# Patient Record
Sex: Female | Born: 1990 | Race: White | Hispanic: No | Marital: Single | Smoking: Never smoker
Health system: Southern US, Community
[De-identification: ages and names within clinical notes are randomized; demographics above are authoritative.]

## PROBLEM LIST (undated history)

## (undated) DIAGNOSIS — N946 Dysmenorrhea, unspecified: Secondary | ICD-10-CM

## (undated) DIAGNOSIS — E063 Autoimmune thyroiditis: Secondary | ICD-10-CM

## (undated) DIAGNOSIS — E11649 Type 2 diabetes mellitus with hypoglycemia without coma: Secondary | ICD-10-CM

## (undated) HISTORY — DX: Autoimmune thyroiditis: E06.3

## (undated) HISTORY — DX: Type 2 diabetes mellitus with hypoglycemia without coma: E11.649

## (undated) HISTORY — DX: Dysmenorrhea, unspecified: N94.6

---
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
## 2010-01-03 ENCOUNTER — Emergency Department (HOSPITAL_COMMUNITY): Admission: EM | Admit: 2010-01-03 | Discharge: 2010-01-04 | Payer: Self-pay | Admitting: Emergency Medicine

## 2010-01-04 ENCOUNTER — Telehealth: Payer: Self-pay | Admitting: Family Medicine

## 2010-01-04 ENCOUNTER — Ambulatory Visit: Payer: Self-pay | Admitting: "Endocrinology

## 2010-01-10 ENCOUNTER — Ambulatory Visit: Payer: Self-pay | Admitting: "Endocrinology

## 2010-01-29 ENCOUNTER — Ambulatory Visit: Payer: Self-pay | Admitting: Family Medicine

## 2010-01-29 DIAGNOSIS — M546 Pain in thoracic spine: Secondary | ICD-10-CM | POA: Insufficient documentation

## 2010-02-12 ENCOUNTER — Ambulatory Visit: Payer: Self-pay | Admitting: "Endocrinology

## 2010-03-05 ENCOUNTER — Telehealth: Payer: Self-pay | Admitting: Family Medicine

## 2010-04-01 ENCOUNTER — Encounter: Payer: Self-pay | Admitting: Family Medicine

## 2010-04-05 ENCOUNTER — Encounter: Payer: Self-pay | Admitting: Family Medicine

## 2010-06-07 ENCOUNTER — Ambulatory Visit: Payer: Self-pay | Admitting: Women's Health

## 2010-07-04 ENCOUNTER — Encounter: Payer: Self-pay | Admitting: Family Medicine

## 2010-07-04 ENCOUNTER — Ambulatory Visit: Payer: Self-pay | Admitting: "Endocrinology

## 2010-07-18 ENCOUNTER — Encounter: Payer: Self-pay | Admitting: Family Medicine

## 2010-07-22 ENCOUNTER — Encounter: Admission: RE | Admit: 2010-07-22 | Discharge: 2010-07-26 | Payer: Self-pay | Admitting: Internal Medicine

## 2010-10-15 ENCOUNTER — Ambulatory Visit: Payer: Self-pay | Admitting: "Endocrinology

## 2010-11-28 NOTE — Letter (Signed)
Summary: [facility]  [facility]   Imported By: Lanelle Bal 04/08/2010 10:36:35  _____________________________________________________________________  External Attachment:    Type:   Image     Comment:   External Document

## 2010-11-28 NOTE — Progress Notes (Signed)
Summary: Phone-Fyi  Phone Note Call from Patient Call back at Home Phone [phone]   Caller: Mom Reason for Call: Lab or Test Results Summary of Call: Patient had blood sugar of 592 and was taking to the emergency room @ [facility]. Mother states she is taking her to a endocringloist this morning. Initial call taken by: Barb Merino,  January 04, 2010 8:38 AM  Follow-up for Phone Call        [facility] Triage Call Report Triage Record Num: [id number] Operator: Aundra Millet Patient Name: Esther Bradstreet Call Date & Time: 01/03/2010 9:18:48PM Patient Phone: [phone] PCP: Lelon Perla Patient Gender: Female PCP Fax : Patient DOB: 01-Apr-1991 Practice Name: [facility] Reason for Call: MOm/Lauren calling -- LMP 12/19/2009 -- Pt has been having increased thirst for several weeks along with vaginal yeast infections .Rash on lower abd onset 01/02/2010 , but tonight now more widespead. Pt was planning to make appt tomorrow. Tonight, 01/03/2010, Mom decided to check pt's BS , b/c sibling has Juvenille DM -- pt's BS is 592, and checked again and got BS 584 -- pt has never been diagnosed with DM. RN advised to go to [facility] ED now Protocol(s) Used: Diabetes - High Blood Sugar (Pediatric) Recommended Outcome per Protocol: See ED Immediately Reason for Outcome: Blood glucose > 500 mg/dl (64.4 mmol/l) Care Advice:  ~ BRING MEDS: Be sure to bring a list of your child's meds with you. GO TO ED NOW (or PCP triage) IF NO PCP TRIAGE: Your child needs to be seen within the next hour. Go to the [facility] at _____________ Hospital. Leave as soon as you can. IF PCP TRIAGE REQUIRED: Your child may need to be seen. Your doctor will want to talk with you to decide what's best. I'll page him now. If you haven't heard from the on-call doctor within 30 minutes, or your child becomes worse, go directly to the [facility] at ___________ Hospital.  ~  ~ CARE ADVICE given per  Diabetes - High Blood Sugar (Pediatric) guideline  Additional Follow-up for Phone Call Additional follow up Details #1::        Tried to call;  pt on way to Endocrinologis this am .Kandice Hams  January 04, 2010 8:56 AM  t    Additional Follow-up for Phone Call Additional follow up Details #2::    Try to call them later please Follow-up by: Loreen Freud DO,  January 04, 2010 11:18 AM  Additional Follow-up for Phone Call Additional follow up Details #3:: Details for Additional Follow-up Action Taken: pt was dx with dm type 1 and some thyroid issue as well. pt mother states that we should be expecting some paper work from dr Fransico Michael ph [phone]. pt is feeling a little better and has been started on insulin and is schedule to go back to get further teach on how to cope with DM. Pt will eventually get a insulin pump but for now will use the insulin and go to nutritional classes...............Marland KitchenFelecia Deloach CMA  January 04, 2010 3:15 PM

## 2010-11-28 NOTE — Progress Notes (Signed)
Summary: referral  Phone Note Call from Patient   Caller: Patient Summary of Call: Pt would like to go ahead and see the chiropractic as suggested. Pls advise ok to refer........Marland KitchenFelecia Deloach CMA  Mar 05, 2010 11:39 AM   Follow-up for Phone Call        She can make her own appointment-- just give her Dr Herbert Moors number Follow-up by: Loreen Freud DO,  Mar 05, 2010 12:14 PM  Additional Follow-up for Phone Call Additional follow up Details #1::        pt aware,. dr Christell Faith info given to pt.Marland KitchenMarland KitchenFelecia Deloach CMA  Mar 05, 2010 12:23 PM

## 2010-11-28 NOTE — Assessment & Plan Note (Signed)
Summary: back pain//lch   Vital Signs:  Patient profile:   20 year old female Height:      63 inches Weight:      167 pounds BMI:     29.69 Pulse rate:   72 / minute Pulse rhythm:   regular BP sitting:   100 / 70  (left arm) Cuff size:   large  Vitals Entered By: Army Fossa CMA (January 29, 2010 4:05 PM) CC: Pt here has had upper back pain over the past semester and it is gradually getting worse., Back Pain   History of Present Illness:       This is an 20 year old woman who presents with Back Pain.  The patient denies fever, chills, weakness, loss of sensation, fecal incontinence, urinary incontinence, urinary retention, dysuria, rest pain, inability to work, and inability to care for self.  The pain is located in the left thoracic back.  The pain began at home, gradually, and after lifting.  Pt helped her sister after her foot surgery---pain started then.  Current Medications (verified): 1)  Novolog 100 Unit/ml Soln (Insulin Aspart) 2)  Lantus 100 Unit/ml Soln (Insulin Glargine) 3)  Flexeril 10 Mg Tabs (Cyclobenzaprine Hcl) .Marland Kitchen.. 1 By Mouth Three Times A Day  Allergies: 1)  ! Amoxicillin 2)  ! Pcn 3)  ! Zithromax  Past History:  Past medical, surgical, family and social histories (including risk factors) reviewed for relevance to current acute and chronic problems.  Past Medical History: Reviewed history from 05/31/2008 and no changes required. dysmenorrhea  Past Surgical History: Reviewed history from 05/31/2008 and no changes required. none  Family History: Reviewed history from 05/12/2007 and no changes required. PGf- MI Family History of Cholesterol Disease  Social History: Reviewed history from 05/31/2008 and no changes required. Negative history of passive tobacco smoke exposure.  Care taker verifies today that the child's current immunizations are up to date.  Not using alcohol Not using substances of abuse  Review of Systems      See  HPI  Physical Exam  General:  Well-developed,well-nourished,in no acute distress; alert,appropriate and cooperative throughout examination Msk:  + muscle spasms and ropiness in paraspinal muscles b/l --L>R  normal ROM, no joint tenderness, no joint swelling, no joint warmth, and no redness over joints.   myofascial tech used to loosen muscles===attempted HVLA ---muscles were too tight even after trigger point and myofa. tech used Neurologic:  alert & oriented X3 and gait normal.   Psych:  Oriented X3 and normally interactive.     Impression & Recommendations:  Problem # 1:  BACK PAIN, THORACIC REGION, LEFT (ICD-724.1)  Her updated medication list for this problem includes:    Flexeril 10 Mg Tabs (Cyclobenzaprine hcl) .Marland Kitchen... 1 by mouth three times a day  Discussed use of moist heat or ice, modified activities, medications, and stretching/strengthening exercises. Back care instructions given. To be seen in 2 weeks if no improvement; sooner if worsening of symptoms.   Orders: OMT 1-2 Body Regions [phone])  Complete Medication List: 1)  Novolog 100 Unit/ml Soln (Insulin aspart) 2)  Lantus 100 Unit/ml Soln (Insulin glargine) 3)  Flexeril 10 Mg Tabs (Cyclobenzaprine hcl) .Marland Kitchen.. 1 by mouth three times a day Prescriptions: FLEXERIL 10 MG TABS (CYCLOBENZAPRINE HCL) 1 by mouth three times a day  #30 x 0   Entered and Authorized by:   Loreen Freud DO   Signed by:   Loreen Freud DO on 01/29/2010   Method used:  Electronically to        [facility]       [address]       [place], [place]  [address]       Ph: [phone]       Fax: [phone]   RxID:   [id number]

## 2011-01-20 ENCOUNTER — Ambulatory Visit (INDEPENDENT_AMBULATORY_CARE_PROVIDER_SITE_OTHER): Payer: Self-pay | Admitting: "Endocrinology

## 2011-01-20 DIAGNOSIS — E1069 Type 1 diabetes mellitus with other specified complication: Secondary | ICD-10-CM

## 2011-01-20 DIAGNOSIS — E1065 Type 1 diabetes mellitus with hyperglycemia: Secondary | ICD-10-CM

## 2011-01-20 DIAGNOSIS — E049 Nontoxic goiter, unspecified: Secondary | ICD-10-CM

## 2011-01-20 DIAGNOSIS — E038 Other specified hypothyroidism: Secondary | ICD-10-CM

## 2011-01-20 DIAGNOSIS — E063 Autoimmune thyroiditis: Secondary | ICD-10-CM

## 2011-01-20 LAB — URINE CULTURE

## 2011-01-20 LAB — POCT I-STAT 3, VENOUS BLOOD GAS (G3P V)
Acid-Base Excess: 1 mmol/L (ref 0.0–2.0)
pCO2, Ven: 43.3 mmHg — ABNORMAL LOW (ref 45.0–50.0)
pH, Ven: 7.385 — ABNORMAL HIGH (ref 7.250–7.300)
pO2, Ven: 25 mmHg — CL (ref 30.0–45.0)

## 2011-01-20 LAB — GLUCOSE, CAPILLARY
Glucose-Capillary: 292 mg/dL — ABNORMAL HIGH (ref 70–99)
Glucose-Capillary: 507 mg/dL — ABNORMAL HIGH (ref 70–99)

## 2011-01-20 LAB — URINALYSIS, ROUTINE W REFLEX MICROSCOPIC
Bilirubin Urine: NEGATIVE
Hgb urine dipstick: NEGATIVE
Ketones, ur: NEGATIVE mg/dL
Nitrite: NEGATIVE
Protein, ur: NEGATIVE mg/dL
Urobilinogen, UA: 0.2 mg/dL (ref 0.0–1.0)

## 2011-01-20 LAB — DIFFERENTIAL
Basophils Absolute: 0 10*3/uL (ref 0.0–0.1)
Basophils Relative: 0 % (ref 0–1)
Eosinophils Absolute: 0.1 10*3/uL (ref 0.0–0.7)
Monocytes Absolute: 0.6 10*3/uL (ref 0.1–1.0)
Monocytes Relative: 10 % (ref 3–12)
Neutro Abs: 3.6 10*3/uL (ref 1.7–7.7)
Neutrophils Relative %: 54 % (ref 43–77)

## 2011-01-20 LAB — POCT PREGNANCY, URINE: Preg Test, Ur: NEGATIVE

## 2011-01-20 LAB — COMPREHENSIVE METABOLIC PANEL
ALT: 42 U/L — ABNORMAL HIGH (ref 0–35)
AST: 29 U/L (ref 0–37)
Alkaline Phosphatase: 192 U/L — ABNORMAL HIGH (ref 39–117)
GFR calc Af Amer: >60 mL/min (ref >60)
Glucose, Bld: 454 mg/dL — ABNORMAL HIGH (ref 70–99)
Potassium: 3.5 mEq/L (ref 3.5–5.1)
Sodium: 128 mEq/L — ABNORMAL LOW (ref 135–145)
Total Protein: 7.4 g/dL (ref 6.0–8.3)

## 2011-01-20 LAB — CBC
Hemoglobin: 14.7 g/dL (ref 12.0–15.0)
RBC: 4.52 MIL/uL (ref 3.87–5.11)
RDW: 11.7 % (ref 11.5–15.5)

## 2011-01-20 LAB — POCT I-STAT, CHEM 8
BUN: 12 mg/dL (ref 6–23)
Calcium, Ion: 1.17 mmol/L (ref 1.12–1.32)
Chloride: 99 mEq/L (ref 96–112)
HCT: 43 % (ref 36.0–46.0)
Potassium: 3.6 mEq/L (ref 3.5–5.1)
Sodium: 131 mEq/L — ABNORMAL LOW (ref 135–145)

## 2011-01-20 LAB — HEMOGLOBIN A1C: Mean Plasma Glucose: 226 mg/dL

## 2011-01-20 LAB — KETONES, QUALITATIVE: Acetone, Bld: NEGATIVE

## 2011-01-21 ENCOUNTER — Encounter: Payer: Self-pay | Admitting: Internal Medicine

## 2011-01-21 ENCOUNTER — Ambulatory Visit (INDEPENDENT_AMBULATORY_CARE_PROVIDER_SITE_OTHER): Payer: BC Managed Care – PPO | Admitting: Internal Medicine

## 2011-01-21 DIAGNOSIS — H9209 Otalgia, unspecified ear: Secondary | ICD-10-CM | POA: Insufficient documentation

## 2011-01-21 DIAGNOSIS — E109 Type 1 diabetes mellitus without complications: Secondary | ICD-10-CM

## 2011-01-21 DIAGNOSIS — E063 Autoimmune thyroiditis: Secondary | ICD-10-CM | POA: Insufficient documentation

## 2011-01-21 MED ORDER — DOXYCYCLINE HYCLATE 100 MG PO CAPS: 100.0000 mg | ORAL_CAPSULE | Freq: Two times a day (BID) | ORAL | Status: AC

## 2011-01-21 NOTE — Assessment & Plan Note (Signed)
Per endocrinology 

## 2011-01-21 NOTE — Progress Notes (Signed)
  Subjective:    Patient ID: Traci Rivas, female    DOB: [birth year], 20 y.o.   MRN: [id number]  HPI  left ear pain for a few days, ~  2 weeks  the pain is on and off, definitely worse if she push  her ear   PMH  insulin-dependent diabetes Hashimoto's   Review of Systems   denies fevers No runny nose or sore throat No discharge from the ear No sinus pain but mild congestion    Objective:   Physical Exam  alert oriented x3 Face is symmetric, nontender to palpation of the maxillary sinuses  oropharynx is normal without redness or discharge  right ear normal Left ear: Tympanic membrane is normal, canal is slightly swollen and red without discharge.  Not tender to percussion on either mastoid area       Assessment & Plan:

## 2011-01-21 NOTE — Assessment & Plan Note (Addendum)
20 year old female with diabetes presents with otalgia  exam shows some swelling at the left ear canal with  a + trago  sign, most likely she has a s.q. Infection in the canal , no evidence of swimmers ear Will treat w/  Doxy, See instructions

## 2011-01-21 NOTE — Patient Instructions (Signed)
Doxy as prescribed x 1 week Tylenol for pain Call any time if pain increases, you have fever, ear discharge, swelling around the ear

## 2011-01-22 ENCOUNTER — Telehealth: Payer: Self-pay | Admitting: *Deleted

## 2011-01-22 NOTE — Telephone Encounter (Signed)
Message copied by Army Fossa on Wed Jan 22, 2011  3:50 PM ------      Message from: Traci Rivas      Created: Tue Jan 21, 2011  8:35 PM       Check on her, was seen w/ otalgia

## 2011-01-23 NOTE — Telephone Encounter (Signed)
THX!

## 2011-01-23 NOTE — Telephone Encounter (Signed)
I spoke w/ pt she states that her ears are doing much better!

## 2011-02-06 LAB — URINALYSIS, ROUTINE W REFLEX MICROSCOPIC
Bilirubin Urine: NEGATIVE
Glucose, UA: NEGATIVE mg/dL
Hgb urine dipstick: NEGATIVE
Protein, ur: NEGATIVE mg/dL

## 2011-02-06 LAB — CBC
Platelets: 242 10*3/uL (ref 150–400)
RDW: 12.1 % (ref 11.4–15.5)

## 2011-02-06 LAB — URINE CULTURE

## 2011-02-06 LAB — BASIC METABOLIC PANEL
BUN: 12 mg/dL (ref 6–23)
Calcium: 9.2 mg/dL (ref 8.4–10.5)
Glucose, Bld: 112 mg/dL — ABNORMAL HIGH (ref 70–99)
Sodium: 134 mEq/L — ABNORMAL LOW (ref 135–145)

## 2011-02-06 LAB — DIFFERENTIAL
Basophils Absolute: 0 10*3/uL (ref 0.0–0.1)
Lymphocytes Relative: 5 % — ABNORMAL LOW (ref 24–48)
Neutro Abs: 10.3 10*3/uL — ABNORMAL HIGH (ref 1.7–8.0)

## 2011-03-11 NOTE — Assessment & Plan Note (Signed)
[facility]                                 ON-CALL NOTE   Traci Rivas, Traci Rivas                    MRN:          [id number]  DATE:04/24/2007                            DOB:          Mar 08, 1991    TIME OF PHONE CALL:  10:24 a.m.   The phone message said she is canceling her 12-noon appointment because  she was feeling better.     Karie Schwalbe, MD  Electronically Signed    RIL/MedQ  DD: 04/24/2007  DT: 04/24/2007  Job #: [id number]   cc:   Lelon Perla, DO

## 2011-03-18 ENCOUNTER — Encounter: Payer: Self-pay | Admitting: *Deleted

## 2011-04-29 ENCOUNTER — Ambulatory Visit (INDEPENDENT_AMBULATORY_CARE_PROVIDER_SITE_OTHER): Payer: BC Managed Care – PPO | Admitting: "Endocrinology

## 2011-04-29 ENCOUNTER — Encounter: Payer: Self-pay | Admitting: "Endocrinology

## 2011-04-29 VITALS — BP 106/71 | HR 61 | Wt 162.2 lb

## 2011-04-29 DIAGNOSIS — E063 Autoimmune thyroiditis: Secondary | ICD-10-CM

## 2011-04-29 DIAGNOSIS — E1169 Type 2 diabetes mellitus with other specified complication: Secondary | ICD-10-CM

## 2011-04-29 DIAGNOSIS — IMO0002 Reserved for concepts with insufficient information to code with codable children: Secondary | ICD-10-CM

## 2011-04-29 DIAGNOSIS — E1065 Type 1 diabetes mellitus with hyperglycemia: Secondary | ICD-10-CM

## 2011-04-29 DIAGNOSIS — E11649 Type 2 diabetes mellitus with hypoglycemia without coma: Secondary | ICD-10-CM

## 2011-04-29 DIAGNOSIS — E038 Other specified hypothyroidism: Secondary | ICD-10-CM

## 2011-04-29 LAB — T4, FREE: Free T4: 1 ng/dL (ref 0.80–1.80)

## 2011-04-29 LAB — T3, FREE: T3, Free: 2.9 pg/mL (ref 2.3–4.2)

## 2011-04-29 LAB — GLUCOSE, POCT (MANUAL RESULT ENTRY): POC Glucose: 134

## 2011-04-29 NOTE — Progress Notes (Addendum)
CC: FU T1DM, hypoglycemia, hypothyroid, thyroiditis  HPI: 20 y.o. Caucasian young woman 1. Patient developed polyuria, polydipsia, excessive thirst, and a difficult to treat yeast vaginitis in February and March 2011. Because her sister has T1DM and the mother recognized the signs and symptoms of DM, the mother did a CBG at home. That value was 592. Mother took the young woman to the ED where her glucose was 507. She was given a dose of insulin and sent home, with instructions to call her doctor. Instead, mother called me and I saw the patient the next day, 03.11.11. She looked tired and was dehydrated. Her thyroid gland was enlarged at a size of 20-25 grams. Her HbA1c was 9.3%.  I put her on our standard multiple daily injection of insulin (MDI) regimen with Lantus as a basal insulin and Novolog as a mealtime insulin. 2. The standard [facility] method for multiple daily injections (MDI) of insulin is to use a basal insulin once a day and a rapid-acting insulin at meals, bedtime (HS), at 2:00 AM if needed, and at other times if needed. Each patient is given a specific MDI insulin plan based upon the patient's age, body size, perceived sensitivity or resistance to insulin, and individual clinical course over time.   A. The standard basal insulin is Lantus (glargine) which can be given as a once daily insulin even at low doses. We usually give Lantus at about bedtime to accompany the HS BG check, snack if needed, or rapid-acting insulin if needed.   B. We can use any of the three currently available rapid-acting insulins: Novolg aspart, Humalog lispro, or Apidra glulisine. We use Novolog aspart insulin as our standard rapid-acting insulin.  C. At mealtimes, we use the Two-Component method for determining the doses of rapidly-acting insulins:   1. The Correction Dose is determined by the BG concentration and the patient's Insulin Sensitivity Factor, for example, one unit for every 50 points of BG > 150.   2. The  Food Dose is determined by the patient's Insulin to Carbohydrate Ratio (ICR), for example one unit of insulin for every 15 grams of carbohydrates.      3. The Total Dose of insulin to be given at a particular meal is the sum of the Correction Dose and Food Dose for that meal.  D. At bedtime the patients checks BG.    1. If the BG is < 200, the patient takes a free snack that is inversely proportional to the BG, for example, if BG < 76 = 40 grams of carbs; BG 76-100 = 30 grams; BG 101-150 = 20 grams; and BG 151-200 = 10 grams.   2. If BG is 201-250, no free snack or additional rapid-acting insulin by sliding scale.   3. If BG is > 250, the patient takes additional rapid-acting insulin by a sliding scale, for example one unit fore every 50 points of BG > 250.  E. At 2:00-3:00 AM, at least initially, the patient will check BG and if the BG is > 250 will take a dose of rapid-acting insulin using the patient's own HS sliding scale.    F. The endocrinologist will change the Lantus dose and the ISF and ICR for rapid-acting insulin as needed to improve BG control. 3. Catie's last PSSG visit was on 03.26.12. 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 Since she's been back home she  increased her Lantus dose back to 31. 4. PROS: Constitutional: The patient feels well, is healthy, and has no significant complaints. Eyes: Vision is good. There are no significant eye complaints. Neck: The patient has no complaints of anterior neck swelling, soreness, tenderness,  pressure, discomfort, or difficulty swallowing.  Heart: Heart rate increases with exercise or other physical activity. The patient has no complaints of palpitations, irregular heat beats, chest pain, or chest pressure. Gastrointestinal: Bowel movents seem normal. The patient has no complaints of excessive hunger, acid reflux, upset stomach, stomach aches  or pains, diarrhea, or constipation. Legs: Muscle mass and strength seem normal. There are no complaints of numbness, tingling, burning, or pain. No edema is noted. Feet: There are no obvious foot problems. There are no complaints of numbness, tingling, burning, or pain.No edema is noted. Hypoglycemia: No severe episodes 5. BG printout: Variable BGs, some higher, some lower  PMFSH: 1. Is doing an on-line bachelor's degree program with [employer].  2. 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  ROS: There are no other significant problems involving her other eleven body systems.  PHYSICAL EXAM: BP 106/71  Pulse 61  Wt 162 lb 3.2 oz (73.573 kg) HbA1c 7.5% Constitutional: The patient looks healthy and appears physically and emotionally well. She is overweight. Eyes: There is no arcus or proptosis.  Mouth: The oropharynx appears normal. The tongue appears normal. There is normal oral moisture. There is no obvious gingivitis. Neck: There are no bruits present. The thyroid gland appears enlarged. The thyroid gland is approximately 25 grams in size. The consistency of the thyroid gland is relatively firm. There is no thyroid tenderness to palpation. Lungs: The lungs are clear. Air movement is good. Heart: The heart rhythm and rate appear normal. Heart sounds S1 and S2 are normal. I do not appreciate any pathologic heart murmurs. Abdomen: The abdominal is enlarged. Bowel sounds are normal. The abdomen is soft and non-tender. There is no obviously palpable hepatomegaly, splenomegaly, or other masses.  Arms: Muscle mass appears appropriate for age.  Hands: There is no obvious tremor. Phalangeal and metacarpophalangeal joints appear normal. Palms are normal. Legs: Muscle mass appears appropriate for age. There is no edema.  Feet: There are no significant deformities. Dorsalis pedis pulses are normal 1+  bilaterally.  Neurologic: Muscle strength is normal for age and gender  in both the upper and the lower extremities. Muscle tone appears normal. Sensation to touch is normal in the legs and feet.  Labs: 01.05.12  ASSESSMENT: 1. T1DM: Overall she is in better control. The lower HbA1c reflects both fewer higher BGs and more lower BGs while at camp. We've discussed the option of an insulin pump. She prefers to remain on a MDI regimen at this time. 2. Hypoglycemia: No severe episodes 2. Hypothyroid: Euthyroid in January. 5. Thyroiditis: clinically quiescent  PLAN: 1. Diagnostic: TFTs and C-peptide 2. Therapeutic: Continue the current insulin plan and Synthroid dose 3. Patient education: Discussed the pros and cons of insulin pumps at length. She feels that her current MDI regimen is working well. Furthermore, due to her upcoming concert schedule she does not want to start any new medical regimen unless she absolutely has to. Total appointment time was greater than 40 minutes. More than 50% of that time was devoted to counseling. 4. Follow-up: three months  Level of Service: This visit lasted in excess of 40 minutes. More than 50%  of the visit was devoted to counseling.

## 2011-06-12 ENCOUNTER — Other Ambulatory Visit: Payer: Self-pay | Admitting: Women's Health

## 2011-06-18 ENCOUNTER — Other Ambulatory Visit: Payer: Self-pay

## 2011-06-25 ENCOUNTER — Encounter: Payer: Self-pay | Admitting: Women's Health

## 2011-06-25 ENCOUNTER — Ambulatory Visit (INDEPENDENT_AMBULATORY_CARE_PROVIDER_SITE_OTHER): Payer: BC Managed Care – PPO | Admitting: Women's Health

## 2011-06-25 VITALS — BP 130/70 | Ht 63.5 in | Wt 162.0 lb

## 2011-06-25 DIAGNOSIS — Z01419 Encounter for gynecological examination (general) (routine) without abnormal findings: Secondary | ICD-10-CM

## 2011-06-25 DIAGNOSIS — B373 Candidiasis of vulva and vagina: Secondary | ICD-10-CM

## 2011-06-25 MED ORDER — FLUCONAZOLE 150 MG PO TABS: 150.0000 mg | ORAL_TABLET | Freq: Once | ORAL | Status: AC

## 2011-06-25 MED ORDER — NORETHIN ACE-ETH ESTRAD-FE 1-20 MG-MCG PO TABS: 1.0000 | ORAL_TABLET | Freq: Every day | ORAL | Status: DC

## 2011-06-25 NOTE — Progress Notes (Signed)
Traci Rivas September 03, 1991 [id number]    History:    The patient presents for annual exam.  Attending [employer] online. Taking classes for [facility] counseling. 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   Past medical history, past surgical history, family history and social history were all reviewed and documented in the EPIC chart.   ROS:  A  ROS was performed and pertinent positives and negatives are included in the history.  Exam:  Filed Vitals:   06/25/11 0935  BP: 130/70    General appearance:  Normal Head/Neck:  Normal, without cervical or supraclavicular adenopathy. Thyroid:  Symmetrical, normal in size, without palpable masses or nodularity. Respiratory  Effort:  Normal  Auscultation:  Clear without wheezing or rhonchi Cardiovascular  Auscultation:  Regular rate, without rubs, murmurs or gallops  Edema/varicosities:  Not grossly evident Abdominal  Soft,nontender, without masses, guarding or rebound.  Liver/spleen:  No organomegaly noted  Hernia:  None appreciated  Skin  Inspection:  Grossly normal  Palpation:  Grossly normal Neurologic/psychiatric  Orientation:  Normal with appropriate conversation.  Mood/affect:  Normal  Genitourinary    Breasts: Examined lying and sitting.     Right: Without masses, retractions, discharge or axillary adenopathy.     Left: Without masses, retractions, discharge or axillary adenopathy.   Inguinal/mons:  Normal without inguinal adenopathy  External genitalia:  Normal  BUS/Urethra/Skene's glands:  Normal  Bladder:  Normal  Vagina:  Normal  Cervix:  Normal  Uterus:   normal in size, shape and contour.  Midline and mobile  Adnexa/parametria:     Rt: Without masses or tenderness.   Lt: Without masses or tenderness.  Anus and perineum: Normal  Digital rectal exam: Normal sphincter tone without palpated masses or tenderness  Assessment/Plan:  20 y.o.SWF Traci Rivas  for annual exam. Gardasil completed, she is  hypothyroid, type I diabetic since age 15 and does see an endocrinologist Dr. Lorriane Shire. She was placed on Loestrin 1/20 last year due to dysmenorrhea. She has done well on the Loestrin and will occasionally skip a cycle while on it, and would like to take continuously to prevent cycles.   Yeast noted on wet prep  Prescription proper use of Loestrin 1/20 to take continuously was given reviewed. Did review slight risk for blood clots strokes,. has had good relief of dysmenorrhea. SBEs, exercise, MVI, condoms encouraged if she does become sexually active. Prescription for Diflucan 150 by mouth x1 dose with a refill. Yeast prevention discussed will call if no relief.   [name] [facility], 11:29 AM 06/25/2011

## 2011-07-07 ENCOUNTER — Telehealth: Payer: Self-pay

## 2011-07-07 MED ORDER — LEVONORGEST-ETH ESTRAD 91-DAY 0.15-0.03 &0.01 MG PO TABS: 1.0000 | ORAL_TABLET | Freq: Every day | ORAL | Status: DC

## 2011-07-07 NOTE — Telephone Encounter (Signed)
PT WANTS TO BE ON SAME RX AS HER MOTHER THAT ALSO SEES NANCY. OK PER NANCY TO DO. SENT RX TO PTS PHARMACY ABOVE AND PT NOTIFIED BY CELL VOICEMAIL.

## 2011-08-11 ENCOUNTER — Encounter: Payer: Self-pay | Admitting: "Endocrinology

## 2011-08-11 ENCOUNTER — Ambulatory Visit (INDEPENDENT_AMBULATORY_CARE_PROVIDER_SITE_OTHER): Payer: BC Managed Care – PPO | Admitting: "Endocrinology

## 2011-08-11 VITALS — BP 110/70 | HR 65 | Ht 62.87 in | Wt 164.5 lb

## 2011-08-11 DIAGNOSIS — E1169 Type 2 diabetes mellitus with other specified complication: Secondary | ICD-10-CM

## 2011-08-11 DIAGNOSIS — E11649 Type 2 diabetes mellitus with hypoglycemia without coma: Secondary | ICD-10-CM

## 2011-08-11 DIAGNOSIS — E038 Other specified hypothyroidism: Secondary | ICD-10-CM

## 2011-08-11 DIAGNOSIS — E063 Autoimmune thyroiditis: Secondary | ICD-10-CM

## 2011-08-11 DIAGNOSIS — E049 Nontoxic goiter, unspecified: Secondary | ICD-10-CM

## 2011-08-11 DIAGNOSIS — E1065 Type 1 diabetes mellitus with hyperglycemia: Secondary | ICD-10-CM

## 2011-08-11 DIAGNOSIS — IMO0002 Reserved for concepts with insufficient information to code with codable children: Secondary | ICD-10-CM

## 2011-08-11 LAB — POCT GLYCOSYLATED HEMOGLOBIN (HGB A1C): Hemoglobin A1C: 7.6

## 2011-08-11 NOTE — Progress Notes (Signed)
CC: FU T1DM, hypoglycemia, hypothyroid, thyroiditis  HPI: 20 y.o. Caucasian young woman 1. Patient developed polyuria, polydipsia, excessive thirst, and a difficult to treat yeast vaginitis in February and March 2011. Because her sister has T1DM and the mother recognized the signs and symptoms of DM, the mother did a CBG at home. That value was 592. Mother took the young woman to the ED where her glucose was 507. She was given a dose of insulin and sent home, with instructions to call her doctor. Instead, mother called me and I saw the patient the next day, 03.11.11. She looked tired, was dehydrated, and had a 20-25 gram goiter. Her HbA1c was 9.3%.  I put her on our standard multiple daily injection of insulin (MDI) regimen with Lantus as a basal insulin and Novolog as a mealtime insulin. For details of this plan, please see my office visit note from 04/29/11. 2. Traci Rivas has done fairly well overall. Traci Rivas's last [facility] visit was on 04/29/11. From March 2011 to march 2012, her C-peptide dropped from 2.25 to 0.64 (normal 0.80-3.9), indicating a progressive loss of beta cells over time. She is still using Lantus at a dose of 31 units each evening. Her NovoLog plan remains the 150/50/15 plan. She's been quite healthy over the last 4 months. 3. PROS: Constitutional: The patient feels well, is healthy, and has no significant complaints. Eyes: Vision is good. There are no significant eye complaints. Her last eye exam was in the spring. Neck: The patient has no complaints of anterior neck swelling, soreness, tenderness,  pressure, discomfort, or difficulty swallowing.  Heart: Heart rate increases with exercise or other physical activity. The patient has no complaints of palpitations, irregular heat beats, chest pain, or chest pressure. Gastrointestinal: Bowel movents seem normal. The patient has no complaints of excessive hunger, acid reflux, upset stomach, stomach aches or pains, diarrhea, or constipation. Legs:  Muscle mass and strength seem normal. There are no complaints of numbness, tingling, burning, or pain. No edema is noted. Feet: There are no obvious foot problems. There are no complaints of numbness, tingling, burning, or pain.No edema is noted. Hypoglycemia: No severe episodes. GYN: She is on a new form of birth control pills. Her last menstual period was about 4 weeks ago, prior to starting the new oral contraceptives. 5. BG printout: Continues to have some variable blood sugars. When she checks her sugars regularly and takes the correct amounts of insulin for her plan, she does beautifully. When she doesn't check her sugar or doesn't take insulin appropriately, her blood sugars are often higher or lower than she expected. She often over treats low blood sugars, especially at bedtime.  PMFSH: 1.School: She is doing an on-line bachelor's degree program with [employer].  2. 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   ROS: There are no other significant problems involving her other eleven body systems.  PHYSICAL EXAM: BP 110/70  Pulse 65  Ht 5' 2.87" (1.597 m)  Wt 164 lb 8 oz (74.617 kg)  BMI 29.26 kg/m2 HbA1c 7.6% Constitutional: The patient looks healthy and appears physically and emotionally well. She is overweight. Eyes: There is no arcus or proptosis.  Mouth: The oropharynx appears normal. The tongue appears normal. There is normal oral moisture. There is no obvious gingivitis. Neck: There are no bruits present. The thyroid gland appears enlarged. The thyroid gland is approximately 25 grams in size. The consistency of the thyroid gland is relatively firm. There is no thyroid  tenderness to palpation. Lungs: The lungs are clear. Air movement is good. Heart: The heart rhythm and rate appear normal. Heart sounds S1 and S2 are normal. I do not appreciate any pathologic heart murmurs. Abdomen: The abdominal is enlarged. Bowel sounds are normal. The  abdomen is soft and non-tender. There is no obviously palpable hepatomegaly, splenomegaly, or other masses.  Arms: Muscle mass appears appropriate for age.  Hands: There is no obvious tremor. Phalangeal and metacarpophalangeal joints appear normal. Palms are normal. Legs: Muscle mass appears appropriate for age. There is no edema.  Feet: There are no significant deformities. Dorsalis pedis pulses are normal 1+ bilaterally.  Neurologic: Muscle strength is normal for age and gender  in both the upper and the lower extremities. Muscle tone appears normal. Sensation to touch is normal in the legs and feet.  Labs: 04/29/11. TSH was 2.108. Free T4 was 1.00. Free T3 was 2.9. This was on her Synthroid dose of 25 mcg per day.  ASSESSMENT: 1. T1DM: Overall she is in pretty good control control. We've discussed the option of an insulin pump again. She does not want something mechanical attached to her, so she still prefers to remain on a MDI regimen at this time. Since most of her morning blood sugars arestill in the desired zone of 80-120, we will continue her current Lantus dose at 31 units. We'll adjsut the dose over time. 2. Hypoglycemia: No severe episodes 3. Hypothyroid: Euthyroid in June. 4. Goiter: Essentially unchanged in size since last visit. 5. Thyroiditis: Clinically quiescent  PLAN: 1. Diagnostic: TFTs prior to next visit. 2. Therapeutic: Continue the current insulin plan and Synthroid dose. Trying not to overtreat bedtime lows. Try to check blood sugar and to take insulin at each meal. 3. Patient education: Discussed the pros and cons of insulin pumps at length. She feels that her current MDI regimen is working well. Furthermore, she really does not want to be on a pump.  4. Follow-up: three months  Level of Service: This visit lasted in excess of 40 minutes. More than 50% of the visit was devoted to counseling.

## 2011-08-11 NOTE — Patient Instructions (Signed)
Followup in 3 months. Please have thyroid tests about one week prior to next visit.

## 2011-09-04 ENCOUNTER — Ambulatory Visit (INDEPENDENT_AMBULATORY_CARE_PROVIDER_SITE_OTHER): Payer: BC Managed Care – PPO | Admitting: Family Medicine

## 2011-09-04 ENCOUNTER — Encounter: Payer: Self-pay | Admitting: Family Medicine

## 2011-09-04 VITALS — BP 118/74 | HR 106 | Temp 98.6°F | Wt 167.8 lb

## 2011-09-04 DIAGNOSIS — J069 Acute upper respiratory infection, unspecified: Secondary | ICD-10-CM

## 2011-09-04 DIAGNOSIS — J029 Acute pharyngitis, unspecified: Secondary | ICD-10-CM

## 2011-09-04 LAB — POCT RAPID STREP A (OFFICE): Rapid Strep A Screen: NEGATIVE

## 2011-09-04 MED ORDER — AZITHROMYCIN 250 MG PO TABS: ORAL_TABLET | ORAL | Status: AC

## 2011-09-04 NOTE — Progress Notes (Signed)
  Subjective:     Traci Rivas is a 20 y.o. female who presents for evaluation of symptoms of a URI. Symptoms include congestion, nasal congestion, no  fever, productive cough with  clear colored sputum and wheezing. Onset of symptoms was 4 days ago, and has been gradually worsening since that time. Treatment to date: none.  The following portions of the patient's history were reviewed and updated as appropriate: allergies, current medications, past family history, past medical history, past social history, past surgical history and problem list.  Review of Systems Pertinent items are noted in HPI.   Objective:    BP 118/74  Pulse 106  Temp(Src) 98.6 F (37 C) (Oral)  Wt 167 lb 12.8 oz (76.114 kg)  SpO2 97% General appearance: alert, cooperative, appears stated age and no distress Ears: normal TM's and external ear canals both ears Nose: Nares normal. Septum midline. Mucosa normal. No drainage or sinus tenderness. Throat: lips, mucosa, and tongue normal; teeth and gums normal Neck: no adenopathy, no carotid bruit, no JVD, supple, symmetrical, trachea midline and thyroid not enlarged, symmetric, no tenderness/mass/nodules Lungs: diminished breath sounds bilaterally and wheezes bilaterally Heart: regular rate and rhythm, S1, S2 normal, no murmur, click, rub or gallop   Assessment:    bronchitis and viral upper respiratory illness   Plan:    Discussed diagnosis and treatment of URI. Suggested symptomatic OTC remedies. Nasal saline spray for congestion. Nasal steroids per orders. Follow up as needed.

## 2011-09-04 NOTE — Patient Instructions (Signed)

## 2011-09-08 ENCOUNTER — Telehealth: Payer: Self-pay | Admitting: *Deleted

## 2011-09-08 MED ORDER — CEFUROXIME AXETIL 500 MG PO TABS: 500.0000 mg | ORAL_TABLET | Freq: Two times a day (BID) | ORAL | Status: AC

## 2011-09-08 NOTE — Telephone Encounter (Signed)
Pt still c/o coughing,congestion, some tenderness and pain in thoat area and ears stopped up despite 4 days of antibiotic. Pt seen on 09-04-11 for URI.Please advise

## 2011-09-08 NOTE — Telephone Encounter (Signed)
Spoke with patient and she [place] aware we are calling in a stronger Abx and she will need to stop the current Abx and if any concerns call the office     KP

## 2011-09-08 NOTE — Telephone Encounter (Signed)
ceftin 500 mg 1 po bid for 10 days 

## 2011-09-11 ENCOUNTER — Other Ambulatory Visit: Payer: Self-pay | Admitting: Family Medicine

## 2011-09-11 DIAGNOSIS — Z20828 Contact with and (suspected) exposure to other viral communicable diseases: Secondary | ICD-10-CM

## 2011-09-11 MED ORDER — OSELTAMIVIR PHOSPHATE 75 MG PO CAPS: ORAL_CAPSULE | ORAL | Status: AC

## 2011-09-21 ENCOUNTER — Encounter (HOSPITAL_BASED_OUTPATIENT_CLINIC_OR_DEPARTMENT_OTHER): Payer: Self-pay | Admitting: *Deleted

## 2011-09-21 ENCOUNTER — Emergency Department (HOSPITAL_BASED_OUTPATIENT_CLINIC_OR_DEPARTMENT_OTHER)
Admission: EM | Admit: 2011-09-21 | Discharge: 2011-09-21 | Disposition: A | Discharge status: Home / Self Care | Payer: BC Managed Care – PPO | Attending: Emergency Medicine | Admitting: Emergency Medicine

## 2011-09-21 DIAGNOSIS — E109 Type 1 diabetes mellitus without complications: Secondary | ICD-10-CM | POA: Insufficient documentation

## 2011-09-21 DIAGNOSIS — H9209 Otalgia, unspecified ear: Secondary | ICD-10-CM | POA: Insufficient documentation

## 2011-09-21 DIAGNOSIS — E038 Other specified hypothyroidism: Secondary | ICD-10-CM | POA: Insufficient documentation

## 2011-09-21 MED ORDER — ANTIPYRINE-BENZOCAINE 5.4-1.4 % OT SOLN
3.0000 [drp] | OTIC | Status: DC | PRN
  Administered 2011-09-21: 3 [drp] via OTIC
  Filled 2011-09-21: qty 10

## 2011-09-21 NOTE — ED Provider Notes (Signed)
History     CSN: [id number] Arrival date & time: 09/21/2011  1:49 PM   First MD Initiated Contact with Patient 09/21/11 1358      Chief Complaint  Patient presents with  . Otalgia    (Consider location/radiation/quality/duration/timing/severity/associated sxs/prior treatment) HPI Comments: Pt states that she had a cold last week and she was treated but she is continuing to have ear pain bilaterally, but more in the right:pt is concerned because she is supposed to fly tomorrow  Patient is a 20 y.o. female presenting with ear pain. The history is provided by the patient and a parent. No language interpreter was used.  Otalgia This is a new problem. The current episode started more than 1 week ago. There is pain in both ears. The problem occurs constantly. The problem has not changed since onset.There has been no fever. The pain is mild. Associated symptoms include rhinorrhea. Pertinent negatives include no ear discharge, no headaches and no sore throat.    Past Medical History  Diagnosis Date  . Diabetes mellitus     type 1  . Hypoglycemia associated with diabetes   . Dysmenorrhea   . Hypothyroidism, acquired, autoimmune   . Thyroiditis, autoimmune     History reviewed. No pertinent past surgical history.  Family History  Problem Relation Age of Onset  . Heart attack Paternal Grandfather   . Heart disease Paternal Grandfather   . Diabetes Sister   . Hypothyroidism Sister   . Cancer Paternal Grandmother     History  Substance Use Topics  . Smoking status: Never Smoker   . Smokeless tobacco: Never Used  . Alcohol Use: No    OB History    Grav Para Term Preterm Abortions TAB SAB Ect Mult Living   0               Review of Systems  HENT: Positive for ear pain and rhinorrhea. Negative for sore throat and ear discharge.   Neurological: Negative for headaches.  All other systems reviewed and are negative.    Allergies  Amoxicillin and Penicillins  Home  Medications   Current Outpatient Rx  Name Route Sig Dispense Refill  . INSULIN ASPART 100 UNIT/ML [place] SOLN Subcutaneous Inject into the skin 3 (three) times daily before meals.      . INSULIN GLARGINE 100 UNIT/ML [place] SOLN Subcutaneous Inject 31 Units into the skin at bedtime.      Marland Kitchen LEVONORGEST-ETH ESTRAD 91-DAY 0.15-0.03 &0.01 MG PO TABS Oral Take 1 tablet by mouth daily. 1 Package 4  . LEVOTHYROXINE SODIUM 25 MCG PO TABS Oral Take 25 mcg by mouth daily.      . OSELTAMIVIR PHOSPHATE 75 MG PO CAPS  1 po qd for 10 days 10 capsule 0    BP 135/74  Pulse 87  Temp(Src) 98.2 F (36.8 C) (Oral)  Resp 18  Ht 5\' 2"  (1.575 m)  Wt 160 lb (72.576 kg)  BMI 29.26 kg/m2  SpO2 100%  LMP 06/21/2011  Physical Exam  Nursing note and vitals reviewed. Constitutional: She appears well-developed and well-nourished.  HENT:  Head: Normocephalic and atraumatic.  Right Ear: External ear normal.  Left Ear: External ear normal.  Eyes: Conjunctivae and EOM are normal.  Neck: Normal range of motion. Neck supple.  Cardiovascular: Normal rate and regular rhythm.   Pulmonary/Chest: Effort normal and breath sounds normal.  Neurological: She is alert.    ED Course  Procedures (including critical care time)  Labs Reviewed - No  data to display No results found.   1. Otalgia       MDM  No infection noted:will treat symptomatically        Teressa Lower, NP 09/21/11 1420

## 2011-09-21 NOTE — ED Notes (Signed)
Pt states she has had bilat ear pain x 2 weeks R>L. Has been taking Cefuroxime with some relief.

## 2011-09-21 NOTE — ED Provider Notes (Signed)
Medical screening examination/treatment/procedure(s) were performed by non-physician practitioner and as supervising physician I was immediately available for consultation/collaboration.   Shannel Zahm, MD 09/21/11 2142 

## 2011-09-24 ENCOUNTER — Telehealth: Payer: Self-pay | Admitting: *Deleted

## 2011-09-24 NOTE — Telephone Encounter (Signed)
Left message to call office   [facility] Triage Call Report Triage Record Num: [id number] Operator: Albertine Grates Patient Name: Traci Rivas Call Date & Time: 09/20/2011 6:09:19PM Patient Phone: [phone] PCP: Lelon Perla Patient Gender: Female PCP Fax : [phone] Patient DOB: [birth year] Practice Name: [facility] Reason for Call: Caller: Lauren/Mother; PCP: Lelon Perla.; CB#: [phone]; Call Reason: Medication Issue; Was seen in office 1-2 weeks ago and given Ceftin for sinus congestion/ear pain. Continues to have ear pain. Is leaving on vacation 11-26 and is wanting a different med. Afebrile. No drainage. Wants on call notified for med. Dr. Dayton Martes notified and advised would need to be seen and no antibiotics called without being seen. Medication(s): cefuroxime; Guideline Used: Ear Symptoms ; Disp: See provider within 24 hrs.; Appt Scheduled?: Protocol(s) Used: Ear: Symptoms Recommended Outcome per Protocol: See Provider within 24 hours Reason for Outcome: Constant or intermittent dull earache, throbbing in ear(s) or feeling of fullness; may interfere with sleep or ability to carry out normal activities Care Advice: ~ 11/

## 2011-09-24 NOTE — Telephone Encounter (Signed)
Pt states that she got some ear drops and her ears feel much better now. Pt advise if no better to give [place] a call.

## 2011-10-31 ENCOUNTER — Telehealth: Payer: Self-pay | Admitting: *Deleted

## 2011-10-31 MED ORDER — TERAZOL 3 0.8 % VA CREA: 1.0000 | TOPICAL_CREAM | Freq: Every day | VAGINAL | Status: AC

## 2011-10-31 NOTE — Telephone Encounter (Signed)
Pt was seen back in Aug for annual exam and was giving diflucan 150 mg for yeast. Pt said she never took the pill until 2-3 months later. Now pt is calling stating that yeast is still there, itching mostly. LMP: mid dec. Please advise

## 2011-10-31 NOTE — Telephone Encounter (Signed)
Pt informed with the below note, rx sent to pharmacy. 

## 2011-10-31 NOTE — Telephone Encounter (Signed)
Call in Terazol 3 one applicator at bedtime x3, office visit if no relief. FYI patient is a type I diabetic.

## 2011-11-11 ENCOUNTER — Ambulatory Visit: Payer: BC Managed Care – PPO | Admitting: "Endocrinology

## 2012-01-27 ENCOUNTER — Ambulatory Visit: Payer: BC Managed Care – PPO | Admitting: "Endocrinology

## 2012-02-10 ENCOUNTER — Encounter: Payer: Self-pay | Admitting: "Endocrinology

## 2012-02-10 ENCOUNTER — Ambulatory Visit (INDEPENDENT_AMBULATORY_CARE_PROVIDER_SITE_OTHER): Payer: BC Managed Care – PPO | Admitting: "Endocrinology

## 2012-02-10 VITALS — BP 111/72 | HR 84 | Wt 169.2 lb

## 2012-02-10 DIAGNOSIS — E1065 Type 1 diabetes mellitus with hyperglycemia: Secondary | ICD-10-CM

## 2012-02-10 DIAGNOSIS — E11649 Type 2 diabetes mellitus with hypoglycemia without coma: Secondary | ICD-10-CM

## 2012-02-10 DIAGNOSIS — E063 Autoimmune thyroiditis: Secondary | ICD-10-CM

## 2012-02-10 DIAGNOSIS — E038 Other specified hypothyroidism: Secondary | ICD-10-CM

## 2012-02-10 DIAGNOSIS — B353 Tinea pedis: Secondary | ICD-10-CM

## 2012-02-10 DIAGNOSIS — E1169 Type 2 diabetes mellitus with other specified complication: Secondary | ICD-10-CM

## 2012-02-10 DIAGNOSIS — E049 Nontoxic goiter, unspecified: Secondary | ICD-10-CM

## 2012-02-10 LAB — GLUCOSE, POCT (MANUAL RESULT ENTRY): POC Glucose: 171

## 2012-02-10 LAB — POCT GLYCOSYLATED HEMOGLOBIN (HGB A1C): Hemoglobin A1C: 7.4

## 2012-02-10 MED ORDER — KETOCONAZOLE 2 % EX CREA: TOPICAL_CREAM | Freq: Every day | CUTANEOUS | Status: DC

## 2012-02-10 NOTE — Patient Instructions (Signed)
Followup visit in 3 months. These reduce Lantus dose to 29 units as of tonight. Please follow the rules of subtraction to prevent low blood sugars associated with physical activity. Please review the exercise protocol in your binder.

## 2012-02-10 NOTE — Progress Notes (Signed)
CC: FU T1DM, hypoglycemia, hypothyroid, thyroiditis, and goiter  HPI: 21 y.o. Caucasian young woman 1. The patient developed polyuria, polydipsia, excessive thirst, and a difficult to treat yeast vaginitis in February and March 2011. Because her sister has T1DM and the mother recognized the signs and symptoms of DM, the mother did a CBG at home. That value was 592. Mother took the young woman to the ED where her glucose was 507. She was given a dose of insulin and sent home, with instructions to call her doctor. Instead, mother called me and I saw the patient the next day, 03.11.11. She looked tired, was dehydrated, and had a 20-25 gram goiter. Her HbA1c was 9.3%.  I put her on our standard multiple daily injection of insulin (MDI) regimen with Lantus as a basal insulin and Novolog as a mealtime insulin. For details of this plan, please see my office visit note from 04/29/11. 2. Catie has done fairly well overall. Catie's last [facility] visit was on 08/11/11. She has been healthy. She is still using Lantus at a dose of 31 units each evening. Her NovoLog plan remains the 150/50/15 plan, but she often gives additional Novolog for certain foods,such as cereals. She has recently begun trying to lose weight by running 5 days per week and being more careful with what and how much she eats.  3. Pertinent Review of Systems: Constitutional: The patient feels "good", is healthy, and has no significant complaints. Eyes: Vision is good. There are no significant eye complaints. Her last eye exam was in February or March of this year. No signs of DM were noted. Neck: The patient has no complaints of anterior neck swelling, soreness, tenderness,  pressure, discomfort, or difficulty swallowing.  Heart: Heart rate increases with exercise or other physical activity. The patient has no complaints of palpitations, irregular heat beats, chest pain, or chest pressure. Gastrointestinal: Bowel movents seem normal. The patient has no  complaints of excessive hunger, acid reflux, upset stomach, stomach aches or pains, diarrhea, or constipation. Legs: Muscle mass and strength seem normal. There are no complaints of numbness, tingling, burning, or pain. No edema is noted. Feet: There are no obvious foot problems. There are no complaints of numbness, tingling, burning, or pain. No edema is noted. Hypoglycemia:She is having more frequent episodes of low BG. Her low BGs are due to the combination of new job with more physical activity, eating differently, and running more.  GYN: She is on a new form of birth control pills. Her last menstrual period was about 5 weeks ago. She has a period about every three months on this form of contraception..  4. BG printout: She continues to have some variable blood sugars. She is having frequent 60s shortly after midnight. She also has had some 60s and 70s about supper time. The low BGs at supper tend to occur if she does not eat lunch and/or runs in the early afternoon. BGs vary from 67-339, with most BGs <200. She has not been following our Exercise Protocol, which mandates subtracting 50-150 points of BG from the next BG value that is performed after physical activity.  PAST MEDICAL, FAMILY, AND SOCIAL HISTORY: 1.School: She is doing an on-line bachelor's degree program with [employer].  2. 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 She is fairly active at work and usually works afternoons and evenings. . She runs about 1.5-2 miles,  for about 30 minutes, about 5 days per week. She is also trying to be more careful about what she's eating. She has dropped one pants size already.  3. Primary care provider: Dr. Loreen Freud,  [facility]   REVIEW OF SYSTEMS: There are no other significant problems involving her other body systems.  PHYSICAL EXAM: BP 111/72  Pulse 84  Wt 169  lb 3.2 oz (76.749 kg)  Constitutional: The patient looks healthy and appears physically and emotionally well. She is overweight. Eyes: There is no arcus or proptosis.  Mouth: The oropharynx appears normal. The tongue appears normal. There is normal oral moisture. There is no obvious gingivitis. Neck: There are no bruits present. The thyroid gland appears more enlarged on the right side than on the left. The thyroid gland is approximately 23-25 grams in size. The consistency of the thyroid gland is relatively firm. There is a very mild tenderness to palpation in the right mid-lobe area. Lungs: The lungs are clear. Air movement is good. Heart: The heart rhythm and rate appear normal. Heart sounds S1 and S2 are normal. I do not appreciate any pathologic heart murmurs. Abdomen: The abdomen is enlarged. Bowel sounds are normal. The abdomen is soft and non-tender. There is no obviously palpable hepatomegaly, splenomegaly, or other masses.  Arms: Muscle mass appears appropriate for age.  Hands: There is no obvious tremor. Phalangeal and metacarpophalangeal joints appear normal. Palms are normal. Legs: Muscle mass appears appropriate for age. There is no edema.  Feet: There are no significant deformities. Dorsalis pedis pulses are normal 2+ bilaterally. She also has 2+ tinea pedis. Neurologic: Muscle strength is normal for age and gender  in both the upper and the lower extremities. Muscle tone appears normal. Sensation to touch is normal in the legs and feet.  Labs: 04/29/11. TSH was 2.108. Free T4 was 1.00. Free T3 was 2.9. This was on her Synthroid dose of 25 mcg per day.  Hemoglobin A1c was 7.4% today, compared with 7.6% at last visit.   ASSESSMENT: 1. T1DM: Overall she is in pretty good control control. Because she is eating more carefully, exercising, and working physically, her BGs are generally better. Sometimes she still over-treats low BGs. We've discussed the option of an insulin pump again. She  still does not want something mechanical attached to her, so she still prefers to remain on a MDI regimen at this time.  2. Hypoglycemia: She has had more episodes recently. We can safely reduce her Lantus dose to 29 units..  3. Hypothyroid: She was euthyroid in June. We need to re-check her TFTs today. 4. Goiter: Essentially unchanged in size since last visit. 5. Thyroiditis: Mildly clinically active today. She will continue to lose thyrocytes over time.  6. Tinea pedis: She will benefit from ketoconazole therapy.  PLAN: 1. Diagnostic: TFTs today. 2. Therapeutic: Reduce Lantus dose to 29 units. Continue the current Novolog insulin plan, but adjust doses as needed. Continue current Synthroid dose. Try not to over-treat low BGs. Add ketoconazole 2% cream, one application to her feet daily. 3. Patient education: We discussed the Exercise Protocol and the Rules of Subtraction at length.  4. Follow-up: three months  Level of Service: This visit lasted in excess of 40 minutes. More than 50% of the visit was devoted to counseling.  David Stall

## 2012-02-11 ENCOUNTER — Telehealth: Payer: Self-pay | Admitting: *Deleted

## 2012-02-11 ENCOUNTER — Other Ambulatory Visit: Payer: Self-pay | Admitting: *Deleted

## 2012-02-11 NOTE — Telephone Encounter (Signed)
LVM for patient, told her Dr. Fransico Michael sent in a scrip for ketoconazole cream to her pharmacy. Apply to feet once daily per Dr. Fransico Michael. Louretta Parma, RN

## 2012-04-28 LAB — T3, FREE: T3, Free: 2.8 pg/mL (ref 2.3–4.2)

## 2012-04-28 LAB — T4, FREE: Free T4: 1.02 ng/dL (ref 0.80–1.80)

## 2012-04-29 ENCOUNTER — Other Ambulatory Visit: Payer: Self-pay | Admitting: "Endocrinology

## 2012-05-06 ENCOUNTER — Other Ambulatory Visit: Payer: Self-pay | Admitting: "Endocrinology

## 2012-05-06 NOTE — Telephone Encounter (Signed)
Traci Rivas called to request refills on her Novolog Flexpens. She uses one box per month. Her [facility] pharmacy phone number is [phone]. I called the pharmacy and gave a verbal order for the Novolog Flexpens, one box per month, using up to 50 units per day, with 6 refills. David Stall

## 2012-05-11 ENCOUNTER — Ambulatory Visit (INDEPENDENT_AMBULATORY_CARE_PROVIDER_SITE_OTHER): Payer: BC Managed Care – PPO | Admitting: "Endocrinology

## 2012-05-11 ENCOUNTER — Encounter: Payer: Self-pay | Admitting: "Endocrinology

## 2012-05-11 VITALS — BP 110/72 | HR 81 | Wt 161.4 lb

## 2012-05-11 DIAGNOSIS — E11649 Type 2 diabetes mellitus with hypoglycemia without coma: Secondary | ICD-10-CM

## 2012-05-11 DIAGNOSIS — B353 Tinea pedis: Secondary | ICD-10-CM

## 2012-05-11 DIAGNOSIS — E049 Nontoxic goiter, unspecified: Secondary | ICD-10-CM | POA: Insufficient documentation

## 2012-05-11 DIAGNOSIS — E1169 Type 2 diabetes mellitus with other specified complication: Secondary | ICD-10-CM

## 2012-05-11 DIAGNOSIS — E1065 Type 1 diabetes mellitus with hyperglycemia: Secondary | ICD-10-CM

## 2012-05-11 DIAGNOSIS — E038 Other specified hypothyroidism: Secondary | ICD-10-CM

## 2012-05-11 DIAGNOSIS — E063 Autoimmune thyroiditis: Secondary | ICD-10-CM

## 2012-05-11 NOTE — Patient Instructions (Signed)
Follow up visit in 3 months. 

## 2012-05-11 NOTE — Progress Notes (Signed)
CC: FU T1DM, hypoglycemia, hypothyroid, thyroiditis, and goiter  HPI: 21 y.o. Caucasian young woman 1. The patient developed polyuria, polydipsia, excessive thirst, and a difficult to treat yeast vaginitis in February and March 2011. Because her sister has T1DM and the mother recognized the signs and symptoms of DM, the mother did a CBG at home. That value was 592. Mother took the young woman to the ED where her glucose was 507. She was given a dose of insulin and sent home, with instructions to call her doctor. Instead, mother called me and I saw the patient the next day, 03.11.11. She looked tired, was dehydrated, and had a 20-25 gram goiter. Her HbA1c was 9.3%.  I put her on our standard multiple daily injection of insulin (MDI) regimen with Lantus as a basal insulin and Novolog as a mealtime insulin. For details of this plan, please see my office visit note from 04/29/11. 2. Traci Rivas has done fairly well overall. Traci Rivas's last [facility] visit was on 02/10/12. She has been healthy. She was having more hypoglycemia and so reduced her Lantus dose to 29 units at bedtime. Her NovoLog plan remains the 150/50/15 plan, but she often gives additional Novolog for certain foods, such as cereals. She runs 3-4 times per week, about 3 miles each time. She has dropped two sizes and several pounds.   3. Pertinent Review of Systems: Constitutional: The patient feels "good", is healthy, and has no significant complaints. Eyes: Vision is good. There are no significant eye complaints. Her last eye exam was in February or March of this year. No signs of DM were noted. Neck: The patient has no complaints of anterior neck swelling, soreness, tenderness,  pressure, discomfort, or difficulty swallowing.  Heart: Heart rate increases with exercise or other physical activity. The patient has no complaints of palpitations, irregular heat beats, chest pain, or chest pressure. Gastrointestinal: Bowel movents seem normal. The patient has no  complaints of excessive hunger, acid reflux, upset stomach, stomach aches or pains, diarrhea, or constipation. Legs: Muscle mass and strength seem normal. There are no complaints of numbness, tingling, burning, or pain. No edema is noted. Feet: There are no obvious foot problems. There are no complaints of numbness, tingling, burning, or pain. No edema is noted. Hypoglycemia:She is having less hypoglycemia since reducing Lantus to 29.   GYN: She is on a form of birth control pills that cause menstrual periods to occur only once every three months.  Her last menstrual period was about 5 weeks ago.  4. BG printout: She continues to have some variable blood sugars. Her lowest BG was 83. Her highest Bg was 319. She tends to have BGs >200 in the AMs if her BG at HS was in the low 100s and she took an ad lib snack. On other AMs, the BGs are typically 120s-130s. BGs later in the day tend to be 80s-170s. She has not been checking BGs as regularly. She often takes food doses, but not correction doses. She has not been strictly following our Exercise Protocol, which mandates subtracting 50-150 points of BG from the next BG value that is performed after physical activity.  PAST MEDICAL, FAMILY, AND SOCIAL HISTORY: 1.School: She is doing an on-line bachelor's degree program with [employer].  2. 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 She is fairly active at work and usually works afternoons and evenings. She is running  as above. 3. Primary care provider: Dr. Loreen Freud, [facility]   REVIEW OF SYSTEMS: There are no other significant problems involving her other body systems.  PHYSICAL EXAM: BP 110/72  Pulse 81  Wt 161 lb 6.4 oz (73.211 kg)  Constitutional: The patient looks healthy and appears physically and emotionally well. She has lost 8 pounds since last visit.  Eyes: There  is no arcus or proptosis. She looks slimmer. Mouth: The oropharynx appears normal. The tongue appears normal. There is normal oral moisture. There is no obvious gingivitis. Neck: There are no bruits present. The thyroid gland appears more enlarged on the right side than on the left. The thyroid gland is approximately 23-25 grams in size. The consistency of the thyroid gland is relatively firm. There was no tenderness to palpation of the thyroid gland today.  Lungs: The lungs are clear. Air movement is good. Heart: The heart rhythm and rate appear normal. Heart sounds S1 and S2 are normal. I do not appreciate any pathologic heart murmurs. Abdomen: The abdomen is enlarged. Bowel sounds are normal. The abdomen is soft and non-tender. There is no obviously palpable hepatomegaly, splenomegaly, or other masses.  Arms: Muscle mass appears appropriate for age.  Hands: There is no obvious tremor. Phalangeal and metacarpophalangeal joints appear normal. Palms are normal. Legs: Muscle mass appears appropriate for age. There is no edema.  Feet: There are no significant deformities. Dorsalis pedis pulses are normal 2+ bilaterally. She also has 1+ tinea pedis of her heels. Neurologic: Muscle strength is normal for age and gender  in both the upper and the lower extremities. Muscle tone appears normal. Sensation to touch is normal in the legs and feet.  Labs: 04/27/12. TSH was 1.620. Free T4 was 1.02. Free T3 was 2.8. This was on her Synthroid dose of 25 mcg per day.  Hemoglobin A1c was 7.5%, compared with 7.4% at last visit.   ASSESSMENT: 1. T1DM: Overall she is in pretty good control. Her compliance with checking BGs and taking correction doses has decreased. She is often taking too much snack at HS, causing higher AM BGs. We've discussed the option of an insulin pump again. She still does not want something mechanical attached to her, so she still prefers to remain on a MDI regimen at this time.  2.  Hypoglycemia: She has had fewer episodes since reducing her Lantus dose to 29 units..  3. Hypothyroid: She was euthyroid again earlier this month. . 4. Goiter: Essentially unchanged in size since last visit. 5. Thyroiditis: Clinically quiescent today.  6. Tinea pedis: She benefits from ketoconazole therapy when she uses it.   PLAN: 1. Diagnostic: No labs.today. 2. Therapeutic: Continue Lantus dose of 29 units. Follow the bedtime snack table. Continue the current Novolog insulin plan, but adjust doses as needed. As much as possible, check BGs before eating. Continue current Synthroid dose. Try not to over-treat low BGs. Apply ketoconazole 2% cream, to her feet once daily. 3. Patient education: We discussed the Exercise Protocol and the Rules of Subtraction at length. Traci Rivas knows what to do to control her BGs, but often life gets in the way. 4. Follow-up: three months  Level of Service: This visit lasted in excess of 40 minutes. More than 50% of the visit was devoted to counseling.  David Stall

## 2012-05-18 ENCOUNTER — Other Ambulatory Visit: Payer: Self-pay | Admitting: *Deleted

## 2012-08-09 ENCOUNTER — Other Ambulatory Visit: Payer: Self-pay | Admitting: Women's Health

## 2012-09-03 NOTE — Addendum Note (Signed)
Addended by: David Stall on: 09/03/2012 04:27 PM   Modules accepted: Orders

## 2012-10-11 ENCOUNTER — Encounter: Payer: Self-pay | Admitting: "Endocrinology

## 2012-10-11 ENCOUNTER — Ambulatory Visit (INDEPENDENT_AMBULATORY_CARE_PROVIDER_SITE_OTHER): Payer: BC Managed Care – PPO | Admitting: "Endocrinology

## 2012-10-11 ENCOUNTER — Encounter: Payer: Self-pay | Admitting: Women's Health

## 2012-10-11 ENCOUNTER — Ambulatory Visit (INDEPENDENT_AMBULATORY_CARE_PROVIDER_SITE_OTHER): Payer: BC Managed Care – PPO | Admitting: Women's Health

## 2012-10-11 VITALS — BP 126/84 | Ht 63.0 in | Wt 169.0 lb

## 2012-10-11 VITALS — BP 111/75 | HR 85 | Wt 168.0 lb

## 2012-10-11 DIAGNOSIS — E1065 Type 1 diabetes mellitus with hyperglycemia: Secondary | ICD-10-CM

## 2012-10-11 DIAGNOSIS — IMO0002 Reserved for concepts with insufficient information to code with codable children: Secondary | ICD-10-CM

## 2012-10-11 DIAGNOSIS — I10 Essential (primary) hypertension: Secondary | ICD-10-CM

## 2012-10-11 DIAGNOSIS — B373 Candidiasis of vulva and vagina: Secondary | ICD-10-CM

## 2012-10-11 DIAGNOSIS — E11649 Type 2 diabetes mellitus with hypoglycemia without coma: Secondary | ICD-10-CM

## 2012-10-11 DIAGNOSIS — E1169 Type 2 diabetes mellitus with other specified complication: Secondary | ICD-10-CM

## 2012-10-11 DIAGNOSIS — E109 Type 1 diabetes mellitus without complications: Secondary | ICD-10-CM

## 2012-10-11 DIAGNOSIS — B353 Tinea pedis: Secondary | ICD-10-CM

## 2012-10-11 DIAGNOSIS — E063 Autoimmune thyroiditis: Secondary | ICD-10-CM

## 2012-10-11 DIAGNOSIS — E049 Nontoxic goiter, unspecified: Secondary | ICD-10-CM

## 2012-10-11 DIAGNOSIS — Z01419 Encounter for gynecological examination (general) (routine) without abnormal findings: Secondary | ICD-10-CM

## 2012-10-11 DIAGNOSIS — E038 Other specified hypothyroidism: Secondary | ICD-10-CM

## 2012-10-11 LAB — WET PREP FOR TRICH, YEAST, CLUE: Clue Cells Wet Prep HPF POC: NONE SEEN

## 2012-10-11 LAB — POCT GLYCOSYLATED HEMOGLOBIN (HGB A1C): Hemoglobin A1C: 7.8

## 2012-10-11 LAB — GLUCOSE, POCT (MANUAL RESULT ENTRY): POC Glucose: 237 mg/dl — AB (ref 70–99)

## 2012-10-11 MED ORDER — LISINOPRIL 2.5 MG PO TABS: 2.5000 mg | ORAL_TABLET | Freq: Every day | ORAL | Status: DC

## 2012-10-11 MED ORDER — LEVONORGEST-ETH ESTRAD 91-DAY 0.15-0.03 &0.01 MG PO TABS: 1.0000 | ORAL_TABLET | Freq: Every day | ORAL | Status: DC

## 2012-10-11 MED ORDER — TERCONAZOLE 0.4 % VA CREA: 1.0000 | TOPICAL_CREAM | Freq: Every day | VAGINAL | Status: DC

## 2012-10-11 NOTE — Patient Instructions (Signed)
Follow up visit in three months. Please take your lisinopril each morning. Please do not take lisinopril in the settings of stomach flu or significant dehydration.

## 2012-10-11 NOTE — Patient Instructions (Addendum)
Health Maintenance, 18- to 21-Year-Old SCHOOL PERFORMANCE After high school completion, the Traci Rivas adult may be attending college, technical or vocational school, or entering the military or the work force. SOCIAL AND EMOTIONAL DEVELOPMENT The Traci Rivas adult establishes adult relationships and explores sexual identity. Traci Rivas adults may be living at home or in a college dorm or apartment. Increasing independence is important with Traci Rivas adults. Throughout adolescence, teens should assume responsibility of their own health care. IMMUNIZATIONS Most Traci Rivas adults should be fully vaccinated. A booster dose of Tdap (tetanus, diphtheria, and pertussis, or "whooping cough"), a dose of meningococcal vaccine to protect against a certain type of bacterial meningitis, hepatitis A, human papillomarvirus (HPV), chickenpox, or measles vaccines may be indicated, if not given at an earlier age. Annual influenza or "flu" vaccination should be considered during flu season.   TESTING Annual screening for vision and hearing problems is recommended. Vision should be screened objectively at least once between 18 and 21 years of age. The Traci Rivas adult may be screened for anemia or tuberculosis. Traci Rivas adults should have a blood test to check for high cholesterol during this time period. Traci Rivas adults should be screened for use of alcohol and drugs. If the Traci Rivas adult is sexually active, screening for sexually transmitted infections, pregnancy, or HIV may be performed. Screening for cervical cancer should be performed within 3 years of beginning sexual activity. NUTRITION AND ORAL HEALTH  Adequate calcium intake is important. Consume 3 servings of low-fat milk and dairy products daily. For those who do not drink milk or consume dairy products, calcium enriched foods, such as juice, bread, or cereal, dark, leafy greens, or canned fish are alternate sources of calcium.   Drink plenty of water. Limit fruit juice to 8 to 12 ounces per day.  Avoid sugary beverages or sodas.   Discourage skipping meals, especially breakfast. Teens should eat a good variety of vegetables and fruits, as well as lean meats.   Avoid high fat, high salt, and high sugar foods, such as candy, chips, and cookies.   Encourage Traci Rivas adults to participate in meal planning and preparation.   Eat meals together as a family whenever possible. Encourage conversation at mealtime.   Limit fast food choices and eating out at restaurants.   Brush teeth twice a day and floss.   Schedule dental exams twice a year.  SLEEP Regular sleep habits are important. PHYSICAL, SOCIAL, AND EMOTIONAL DEVELOPMENT  One hour of regular physical activity daily is recommended. Continue to participate in sports.   Encourage Traci Rivas adults to develop their own interests and consider community service or volunteerism.   Provide guidance to the Traci Rivas adult in making decisions about college and work plans.   Make sure that Traci Rivas adults know that they should never be in a situation that makes them uncomfortable, and they should tell partners if they do not want to engage in sexual activity.   Talk to the Traci Rivas adult about body image. Eating disorders may be noted at this time. Traci Rivas adults may also be concerned about being overweight. Monitor the Traci Rivas adult for weight gain or loss.   Mood disturbances, depression, anxiety, alcoholism, or attention problems may be noted in Traci Rivas adults. Talk to the caregiver if there are concerns about mental illness.   Negotiate limit setting and independent decision making.   Encourage the Traci Rivas adult to handle conflict without physical violence.   Avoid loud noises which may impair hearing.   Limit television and computer time to 2 hours per   day. Individuals who engage in excessive sedentary activity are more likely to become overweight.  RISK BEHAVIORS  Sexually active Traci Rivas adults need to take precautions against pregnancy and sexually  transmitted infections. Talk to Traci Rivas adults about contraception.   Provide a tobacco-free and drug-free environment for the Traci Rivas adult. Talk to the Traci Rivas adult about drug, tobacco, and alcohol use among friends or at friends' homes. Make sure the Traci Rivas adult knows that smoking tobacco or marijuana and taking drugs have health consequences and may impact brain development.   Teach the Traci Rivas adult about appropriate use of over-the-counter or prescription medicines.   Establish guidelines for driving and for riding with friends.   Talk to Traci Rivas adults about the risks of drinking and driving or boating. Encourage the Traci Rivas adult to call you if he or she or friends have been drinking or using drugs.   Remind Traci Rivas adults to wear seat belts at all times in cars and life vests in boats.   Traci Rivas adults should always wear a properly fitted helmet when they are riding a bicycle.   Use caution with all-terrain vehicles (ATVs) or other motorized vehicles.   Do not keep handguns in the home. (If you do, the gun and ammunition should be locked separately and out of the Traci Rivas adult's access.)   Equip your home with smoke detectors and change the batteries regularly. Make sure all family members know the fire escape plans for your home.   Teach Traci Rivas adults not to swim alone and not to dive in shallow water.   All individuals should wear sunscreen that protects against UVA and UVB light with at least a sun protection factor (SPF) of 30 when out in the sun. This minimizes sun burning.  WHAT'S NEXT? Traci Rivas adults should visit their pediatrician or family physician yearly. By Traci Rivas adulthood, health care should be transitioned to a family physician or internal medicine specialist. Sexually active females may want to begin annual physical exams with a gynecologist. Document Released: 01/08/2007 Document Revised: 01/05/2012 Document Reviewed: 01/28/2007 ExitCare Patient Information 2013 [employer].    

## 2012-10-11 NOTE — Progress Notes (Signed)
CC: FU T1DM, hypoglycemia, hypothyroid, thyroiditis, and goiter  HPI: 21 y.o. Caucasian young woman 1. The patient developed polyuria, polydipsia, excessive thirst, and a difficult to treat yeast vaginitis in February and March 2011. Because her sister has T1DM and the mother recognized the signs and symptoms of DM, the mother did a CBG at home. That value was 592. Mother took the young woman to the ED where her glucose was 507. She was given a dose of insulin and sent home, with instructions to call her doctor. Instead, mother called me and I saw the patient the next day, 03.11.11. She looked tired, was dehydrated, and had a 20-25 gram goiter. Her HbA1c was 9.3%.  I put her on our standard multiple daily injection of insulin (MDI) regimen with Lantus as a basal insulin and Novolog as a mealtime insulin. For details of this plan, please see my office visit note from 04/29/11. 2. During the past 2-1/2 years Catie has done fairly well overall. Catie's last [facility] visit was on 05/11/12. She has been healthy. Her Lantus dose is 30 units at bedtime. Her Novolog plan remains the 150/50/15 plan, but she often gives additional Novolog for certain foods, such as cereals. She continues on Synthroid, 25 mcg/day and on Amethia oral contraceptive daily as well. She has not been applying ketoconazole to her feet very often for the past several months. She has also not been doing much physical activity for the past several months. She has gained several pounds.   3. Pertinent Review of Systems: Constitutional: The patient feels "good", is healthy, and has no significant complaints. Eyes: Vision is good. There are no significant eye complaints. Her last eye exam was in February or March of this year. No signs of DM were noted. Neck: The patient has no complaints of anterior neck swelling, soreness, tenderness,  pressure, discomfort, or difficulty swallowing.  Heart: Heart rate increases with exercise or other physical  activity. The patient has no complaints of palpitations, irregular heat beats, chest pain, or chest pressure. Gastrointestinal: Bowel movents seem normal. The patient has no complaints of excessive hunger, acid reflux, upset stomach, stomach aches or pains, diarrhea, or constipation. Legs: Muscle mass and strength seem normal. There are no complaints of numbness, tingling, burning, or pain. No edema is noted. Feet: There are no obvious foot problems, except for some worsening of her tinea pedis. There are no complaints of numbness, tingling, burning, or pain. No edema is noted. Hypoglycemia:She has not had much hypoglycemia.    GYN: She is on a form of birth control pills that causes menstrual periods to occur only once every three months.  Her last menstrual period was about 2 months ago.  4. BG printout: Catie is checking BGs 0-3 times daily, average 1.2 times per day. Her BGs are quite variable, ranging from 69-343. AM BGs ranged from 108-324. Lunch BGs were 69-272. Supper BGs ranged from 102-343. Bedtime BGs were 121-293.  She often takes food doses, but not correction doses.   PAST MEDICAL, FAMILY, AND SOCIAL HISTORY: 1.School: She is doing an on-line bachelor's degree program with [employer]. 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 Taken together, she works more than  40 hours per week.  2. Activities: She is fairly active at work, but has not been exercising very often. 3. Primary care provider: Dr. Loreen Freud, [facility]   REVIEW OF SYSTEMS: There are no other significant problems involving  her other body systems.  PHYSICAL EXAM: BP 111/75  Pulse 85  Wt 168 lb (76.204 kg)  Constitutional: The patient looks healthy and appears physically and emotionally well. She has gained 7 pounds since last visit. She has essentially regained all the weight she had lost at her last visit. She looks heavier. Eyes: There is  no arcus or proptosis.  Mouth: The oropharynx appears normal. The tongue appears normal. There is normal oral moisture. There is no obvious gingivitis. Neck: There are no bruits present. The thyroid gland appears more enlarged on the right side than on the left. The thyroid gland is approximately 23-25 grams in size. The consistency of the thyroid gland is relatively firm. There was no tenderness to palpation of the thyroid gland today.  Lungs: The lungs are clear. Air movement is good. Heart: The heart rhythm and rate appear normal. Heart sounds S1 and S2 are normal. I do not appreciate any pathologic heart murmurs. Abdomen: The abdomen is enlarged. Bowel sounds are normal. The abdomen is soft and non-tender. There is no obviously palpable hepatomegaly, splenomegaly, or other masses.  Arms: Muscle mass appears appropriate for age.  Hands: There is no obvious tremor. Phalangeal and metacarpophalangeal joints appear normal. Palms are normal. Legs: Muscle mass appears appropriate for age. There is no edema.  Feet: There are no significant deformities. Dorsalis pedis pulses are normal 2+ bilaterally. She also has 2+ tinea pedis of her heels. Neurologic: Muscle strength is normal for age and gender  in both the upper and the lower extremities. Muscle tone appears normal. Sensation to touch is normal in the legs and feet.  Labs: Hemoglobin A1c today was 7.8%, compared with 7.5% at last visit and 7.4% at the visit prior.   04/27/12. TSH was 1.620. Free T4 was 1.02. Free T3 was 2.8. This was on her Synthroid dose of 25 mcg per day.   ASSESSMENT: 1. T1DM: Overall her BG control is worse.Marland Kitchen Her compliance with checking BGs and taking correction doses has decreased markedly. She has gone as long as 26 hours between BG checks. We've discussed the option of an insulin pump again. She still does not want something mechanical attached to her, so she still prefers to remain on a MDI regimen at this time. She is a  very religious young woman who believes that God will take care of her.  2. Hypoglycemia: She has had  fewer episodes since reducing her activity levels.   3. Hypothyroid: She was chemically euthyroid in July. She is clinically euthyroid now.  4. Goiter: Essentially unchanged in size since last visit. 5. Thyroiditis: Clinically quiescent today.  6. Tinea pedis: This problem has worsened since she stopped using the ketoconazole.  7. Hypertension:  Her diastolic BP is higher, representing mild hypertension. It is time to start lisinopril.   PLAN: 1. Diagnostic: CMP, fasting lipid panel, urinary microalbumin/creatinine ratio. 2. Therapeutic: Get back on plan. Continue Lantus dose of 30 units. Follow the bedtime snack table. Continue the current Novolog insulin plan, but adjust doses as needed. As much as possible, check BGs before eating. Continue current Synthroid dose. Try not to over-treat low BGs. Apply ketoconazole 2% cream, to her feet once daily. Add lisinopril. 2.5 mg, each AM. 3. Patient education: We discussed the use of lisinopril for renal protection, but also the warning not to get pregnant while on lisinopril. We also discussed the long-term adverse effects of higher BGs. Catie knows what to do to control her BGs, but often  life gets in the way. 4. Follow-up: three months  Level of Service: This visit lasted in excess of 40 minutes. More than 50% of the visit was devoted to counseling.  David Stall

## 2012-10-11 NOTE — Progress Notes (Signed)
Traci Rivas 20-Jan-1991 [id number]    History:    The patient presents for annual exam.  Cycles every 3 months on generic Seasonale with good relief of dysmenorrhea. Virgin. Type I diabetic, last hemoglobin A1c was 7.8. Also hypothyroid both managed by Dr. Fransico Michael. Having problems with recurrent yeast. Gardasil series completed.   Past medical history, past surgical history, family history and social history were all reviewed and documented in the EPIC chart. Senior at [employer], 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   ROS:  A  ROS was performed and pertinent positives and negatives are included in the history.  Exam:  Filed Vitals:   10/11/12 1112  BP: 126/84    General appearance:  Normal Head/Neck:  Normal, without cervical or supraclavicular adenopathy. Thyroid:  Symmetrical, normal in size, without palpable masses or nodularity. Respiratory  Effort:  Normal  Auscultation:  Clear without wheezing or rhonchi Cardiovascular  Auscultation:  Regular rate, without rubs, murmurs or gallops  Edema/varicosities:  Not grossly evident Abdominal  Soft,nontender, without masses, guarding or rebound.  Liver/spleen:  No organomegaly noted  Hernia:  None appreciated  Skin  Inspection:  Grossly normal  Palpation:  Grossly normal Neurologic/psychiatric  Orientation:  Normal with appropriate conversation.  Mood/affect:  Normal  Genitourinary    Breasts: Examined lying and sitting.     Right: Without masses, retractions, discharge or axillary adenopathy.     Left: Without masses, retractions, discharge or axillary adenopathy.   Inguinal/mons:  Normal without inguinal adenopathy  External genitalia:  Erythematous   BUS/Urethra/Skene's glands:  Normal  Bladder:  Normal  Vagina:  Erythematous, wet prep positive for yeast   Cervix:  Normal  Uterus:   normal in size, shape and contour.  Midline and  mobile  Adnexa/parametria:     Rt: Without masses or tenderness.   Lt: Without masses or tenderness.  Anus and perineum: Normal    Assessment/Plan:  21 y.o. S. WF virgin for annual exam with complaint of recurrent yeast.  Good relief of dysmenorrhea/menorrhagia on generic Seasonale. Yeast vaginitis Type 1 diabetes/hypothyroid-endocrinologist labs and meds  Plan: Terazol 7 one applicator at bedtime x7, prescription, proper use, refill given. Reviewed correlation between blood sugars and yeast/aware. SBE's, exercise, calcium rich diet, MVI daily, campus and dating safety reviewed. Reviewed importance of condoms if becomes sexually active. Seasonale prescription, proper use, slight risk for blood clots and strokes reviewed. Reviewed new screening guidelines for a Pap, requests to do next year.    [name] [facility], 11:58 AM 10/11/2012

## 2012-11-09 ENCOUNTER — Other Ambulatory Visit: Payer: Self-pay | Admitting: "Endocrinology

## 2012-11-10 ENCOUNTER — Other Ambulatory Visit: Payer: Self-pay | Admitting: *Deleted

## 2012-11-10 DIAGNOSIS — E1065 Type 1 diabetes mellitus with hyperglycemia: Secondary | ICD-10-CM

## 2012-11-10 MED ORDER — GLUCOSE BLOOD VI STRP: ORAL_STRIP | Status: DC

## 2012-11-15 LAB — LIPID PANEL
Cholesterol: 252 mg/dL — ABNORMAL HIGH (ref 0–200)
HDL: 52 mg/dL (ref >39)
Total CHOL/HDL Ratio: 4.8 Ratio

## 2012-11-15 LAB — COMPREHENSIVE METABOLIC PANEL
Albumin: 4.1 g/dL (ref 3.5–5.2)
Alkaline Phosphatase: 73 U/L (ref 39–117)
BUN: 8 mg/dL (ref 6–23)
CO2: 24 mEq/L (ref 19–32)
Glucose, Bld: 142 mg/dL — ABNORMAL HIGH (ref 70–99)
Potassium: 4.3 mEq/L (ref 3.5–5.3)
Total Bilirubin: 0.7 mg/dL (ref 0.3–1.2)

## 2012-11-16 LAB — MICROALBUMIN / CREATININE URINE RATIO
Creatinine, Urine: 246 mg/dL
Microalb Creat Ratio: 2.8 mg/g (ref 0.0–30.0)
Microalb, Ur: 0.7 mg/dL (ref 0.00–1.89)

## 2012-12-22 ENCOUNTER — Other Ambulatory Visit: Payer: Self-pay | Admitting: "Endocrinology

## 2013-01-11 ENCOUNTER — Ambulatory Visit: Payer: BC Managed Care – PPO | Admitting: "Endocrinology

## 2013-01-27 ENCOUNTER — Encounter: Payer: Self-pay | Admitting: Family Medicine

## 2013-01-27 ENCOUNTER — Ambulatory Visit (INDEPENDENT_AMBULATORY_CARE_PROVIDER_SITE_OTHER): Payer: BC Managed Care – PPO | Admitting: Family Medicine

## 2013-01-27 VITALS — BP 108/90 | HR 90 | Temp 98.1°F | Ht 63.0 in | Wt 171.2 lb

## 2013-01-27 DIAGNOSIS — J019 Acute sinusitis, unspecified: Secondary | ICD-10-CM

## 2013-01-27 MED ORDER — PROMETHAZINE-DM 6.25-15 MG/5ML PO SYRP: 5.0000 mL | ORAL_SOLUTION | Freq: Four times a day (QID) | ORAL | Status: DC | PRN

## 2013-01-27 MED ORDER — CLARITHROMYCIN ER 500 MG PO TB24: 1000.0000 mg | ORAL_TABLET | Freq: Every day | ORAL | Status: DC

## 2013-01-27 NOTE — Assessment & Plan Note (Signed)
New to provider.  sxs and PE consistent w/ infxn.  Start abx.  Cough syrup prn.  Reviewed supportive care and red flags that should prompt return.  Pt expressed understanding and is in agreement w/ plan.

## 2013-01-27 NOTE — Progress Notes (Signed)
  Subjective:    Patient ID: Traci Rivas, female    DOB: 04/11/1991, 22 y.o.   MRN: [id number]  HPI URI- sxs started w/ sore throat 10 days ago.  Sore throat improved and pt developed cough.  Then 3 days ago developed nasal congestion, sinus pain/pressure, PND, productive cough.  No fevers.  Mild nausea last night.  Coughing excessively at night.  No known sick contacts.  No hx of seasonal allergies.   Review of Systems For ROS see HPI     Objective:   Physical Exam  Vitals reviewed. Constitutional: She appears well-developed and well-nourished. No distress.  HENT:  Head: Normocephalic and atraumatic.  Right Ear: Tympanic membrane normal.  Left Ear: Tympanic membrane normal.  Nose: Mucosal edema and rhinorrhea present. Right sinus exhibits maxillary sinus tenderness and frontal sinus tenderness. Left sinus exhibits maxillary sinus tenderness and frontal sinus tenderness.  Mouth/Throat: Uvula is midline and mucous membranes are normal. Posterior oropharyngeal erythema present. No oropharyngeal exudate.  Eyes: Conjunctivae and EOM are normal. Pupils are equal, round, and reactive to light.  Neck: Normal range of motion. Neck supple.  Cardiovascular: Normal rate, regular rhythm and normal heart sounds.   Pulmonary/Chest: Effort normal and breath sounds normal. No respiratory distress. She has no wheezes.  Hacking cough  Lymphadenopathy:    She has no cervical adenopathy.          Assessment & Plan:

## 2013-01-27 NOTE — Patient Instructions (Addendum)
This is a sinus infection Start the Biaxin- 2 tabs daily at the same time (take w/ food) Drink plenty of fluids Use the cough syrup as needed- it may cause drowsiness REST! Hang in there!

## 2013-02-15 ENCOUNTER — Ambulatory Visit (INDEPENDENT_AMBULATORY_CARE_PROVIDER_SITE_OTHER): Payer: BC Managed Care – PPO | Admitting: "Endocrinology

## 2013-02-15 ENCOUNTER — Encounter: Payer: Self-pay | Admitting: "Endocrinology

## 2013-02-15 VITALS — BP 113/79 | HR 83 | Wt 175.4 lb

## 2013-02-15 DIAGNOSIS — E11649 Type 2 diabetes mellitus with hypoglycemia without coma: Secondary | ICD-10-CM

## 2013-02-15 DIAGNOSIS — E063 Autoimmune thyroiditis: Secondary | ICD-10-CM

## 2013-02-15 DIAGNOSIS — Z9119 Patient's noncompliance with other medical treatment and regimen: Secondary | ICD-10-CM

## 2013-02-15 DIAGNOSIS — E038 Other specified hypothyroidism: Secondary | ICD-10-CM

## 2013-02-15 DIAGNOSIS — B353 Tinea pedis: Secondary | ICD-10-CM

## 2013-02-15 DIAGNOSIS — E1169 Type 2 diabetes mellitus with other specified complication: Secondary | ICD-10-CM

## 2013-02-15 DIAGNOSIS — E049 Nontoxic goiter, unspecified: Secondary | ICD-10-CM

## 2013-02-15 DIAGNOSIS — I10 Essential (primary) hypertension: Secondary | ICD-10-CM

## 2013-02-15 DIAGNOSIS — E1065 Type 1 diabetes mellitus with hyperglycemia: Secondary | ICD-10-CM

## 2013-02-15 LAB — GLUCOSE, POCT (MANUAL RESULT ENTRY): POC Glucose: 357 mg/dl — AB (ref 70–99)

## 2013-02-15 MED ORDER — KETOCONAZOLE 2 % EX CREA: TOPICAL_CREAM | Freq: Every day | CUTANEOUS | Status: DC

## 2013-02-15 NOTE — Progress Notes (Signed)
CC: FU T1DM, hypoglycemia, hypothyroid, thyroiditis, and goiter  HPI: 22 y.o. Caucasian young woman  1. The patient developed polyuria, polydipsia, excessive thirst, and a difficult to treat yeast vaginitis in February and March 2011. Because her sister had T1DM and the mother recognized the signs and symptoms of DM, the mother did a CBG at home. That value was 592. Mother took the young woman to the ED where her glucose was 507. She was given a dose of insulin and sent home, with instructions to call her doctor. Instead, mother called me and I saw the patient the next day, 01/04/10. She looked tired, was dehydrated, and had a 20-25 gram goiter. Her HbA1c was 9.3%.  I put her on our standard multiple daily injection of insulin (MDI) regimen with Lantus as a basal insulin and Novolog as a mealtime insulin. For details of this plan, please see my office visit note from 04/29/11. 2. During the past 3 years Traci Rivas has done fairly well overall. Traci Rivas's last [facility] visit was on 10/11/12. She has been healthy. Her Lantus dose is 30 units at bedtime. Her Novolog plan remains the 150/50/15 plan, but she often gives additional Novolog for certain foods, such as cereals. She continues on Synthroid, 25 mcg/day and on Amethia oral contraceptive daily as well. She ran out of ketoconazole for her feet. She had lost 6 pounds, then gained back even more. She has been very busy and has not been doing much physical activity for the past several months. .   3. Pertinent Review of Systems: Constitutional: The patient feels "good", is healthy, and has no significant complaints. Eyes: Vision is good. There are no significant eye complaints. Her last eye exam was in March of this year. No signs of DM were noted. Neck: The patient has no complaints of anterior neck swelling, soreness, tenderness,  pressure, discomfort, or difficulty swallowing.  Heart: Heart rate increases with exercise or other physical activity. The patient has no  complaints of palpitations, irregular heat beats, chest pain, or chest pressure. Gastrointestinal: Bowel movents seem normal. The patient has no complaints of excessive hunger, acid reflux, upset stomach, stomach aches or pains, diarrhea, or constipation. Legs: Muscle mass and strength seem normal. There are no complaints of numbness, tingling, burning, or pain. No edema is noted. Feet: There are no obvious foot problems, except for some worsening of her tinea pedis. There are no complaints of numbness, tingling, burning, or pain. No edema is noted. Hypoglycemia: She has had more nocturnal hypoglycemia. Sometimes she misses her bedtime snack. She usually does not subtract 50-100 points after exercise or physical activity.    GYN: She is on a form of birth control pills that causes menstrual periods to occur only once every three months.  Her last menstrual period was about 2 months ago.  4. BG printout: Traci Rivas is checking BGs 0-3 times daily, average 1.3 times per day. Her BGs are quite variable, ranging from 58-416. AM BGs ranged from 60-197. Lunch BGs were 103-188. Supper BGs ranged from  131-263. Bedtime BGs were 85-139, but she had only two bedtime BG checks. She often takes food doses, but not correction doses.   PAST MEDICAL, FAMILY, AND SOCIAL HISTORY: 1.School: She is doing an on-line bachelor's degree program with [employer]. 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 Taken together, she works more than 50 hours per week. She will move to the [place] area in July. 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 2. Activities: She is fairly active at work, but has not been exercising very often. 3. Primary care provider: Dr. Loreen Freud, [facility]   REVIEW OF SYSTEMS: There are no other significant problems involving her other body systems.  PHYSICAL EXAM: BP 113/79  Pulse 83  Wt 175 lb 6.4 oz (79.561 kg)  BMI  31.08 kg/m2  LMP 01/17/2013  Constitutional: The patient looks healthy and appears physically and emotionally well. She has gained 7 pounds since last visit. She has essentially regained all the weight she had lost and more. She looks heavier. Eyes: There is no arcus or proptosis.  Mouth: The oropharynx appears normal. The tongue appears normal. There is normal oral moisture. There is no obvious gingivitis. Neck: There are no bruits present. The thyroid gland appears more enlarged on the right side than on the left. The thyroid gland is approximately 23-25 grams in size. The consistency of the thyroid gland is relatively firm. There was no tenderness to palpation of the thyroid gland today.  Lungs: The lungs are clear. Air movement is good. Heart: The heart rhythm and rate appear normal. Heart sounds S1 and S2 are normal. I do not appreciate any pathologic heart murmurs. Abdomen: The abdomen is enlarged. Bowel sounds are normal. The abdomen is soft and non-tender. There is no obviously palpable hepatomegaly, splenomegaly, or other masses.  Arms: Muscle mass appears appropriate for age.  Hands: There is no obvious tremor. Phalangeal and metacarpophalangeal joints appear normal. Palms are normal. Legs: Muscle mass appears appropriate for age. There is no edema.  Feet: There are no significant deformities. Dorsalis pedis pulses are normal 2+ bilaterally. She also has trace tinea pedis of her feet. Neurologic: Muscle strength is normal for age and gender  in both the upper and the lower extremities. Muscle tone appears normal. Sensation to touch is normal in the legs and feet.  Labs: Hemoglobin A1c today was 9.3%, compared with 7.8% at the last visit and with 7.5% at prior visit.   04/27/12. TSH was 1.620. Free T4 was 1.02. Free T3 was 2.8. This was on her Synthroid dose of 25 mcg per day.   ASSESSMENT: 1. T1DM: Overall her BG control is worse.Marland Kitchen Her compliance with checking BGs and taking correction  doses has decreased further. She has gone as long as 26 hours between BG checks. We've discussed the option of an insulin pump again. She still does not want something mechanical attached to her, so she still prefers to remain on a MDI regimen at this time. She is a very religious young woman who believes that God will take care of her.  2. Hypoglycemia: She has had some episodes recently follow ing physical activities.   3. Hypothyroid: She was chemically euthyroid in July. She is clinically euthyroid now.  4. Goiter: Essentially unchanged in size since last visit. 5. Thyroiditis: Clinically quiescent today.  6. Tinea pedis: This problem has improved since she resumed using the ketoconazole.  7. Hypertension:  Her diastolic BP is better since starting lisinopril. Daily exercise will help.  8. Non-compliance: She is not checking BGs regularly enough to be a safe driver. If she does not improve the frequency and regularity of her BG checks, I will be forced to notify the DMV.  PLAN: 1. Diagnostic: CMP, TFTs, fasting lipid panel, urinary microalbumin/creatinine ratio. Bring in meter for download in one month. If she is doing better I will not need to notify  DMV. 2. Therapeutic: Get back on plan. Continue Lantus dose of 30 units. Follow the bedtime snack table. Continue the current Novolog insulin plan, but adjust doses as needed. As much as possible, check BGs before eating. Continue current Synthroid dose. Try not to over-treat low BGs. Apply ketoconazole 2% cream, to her feet once daily. Continue lisinopril. 2.5 mg, each AM. 3. Patient education: We discussed the use of lisinopril for renal protection, but also the warning not to get pregnant while on lisinopril. We also discussed the long-term adverse effects of higher BGs. Traci Rivas knows what to do to control her BGs, but often life gets in the way. 4. Follow-up: three months  Level of Service: This visit lasted in excess of 40 minutes. More than 50%  of the visit was devoted to counseling.  David Stall

## 2013-02-15 NOTE — Patient Instructions (Signed)
Follow up visit in 3 months. Bring in BG meter in one month for download.  

## 2013-04-11 ENCOUNTER — Other Ambulatory Visit: Payer: Self-pay | Admitting: *Deleted

## 2013-04-11 DIAGNOSIS — E1065 Type 1 diabetes mellitus with hyperglycemia: Secondary | ICD-10-CM

## 2013-04-26 ENCOUNTER — Other Ambulatory Visit: Payer: Self-pay | Admitting: *Deleted

## 2013-04-26 DIAGNOSIS — E1065 Type 1 diabetes mellitus with hyperglycemia: Secondary | ICD-10-CM

## 2013-04-26 MED ORDER — GLUCAGON (RDNA) 1 MG IJ KIT: PACK | INTRAMUSCULAR | Status: DC

## 2013-05-02 LAB — COMPREHENSIVE METABOLIC PANEL
ALT: 12 U/L (ref 0–35)
AST: 17 U/L (ref 0–37)
Albumin: 4.1 g/dL (ref 3.5–5.2)
BUN: 9 mg/dL (ref 6–23)
CO2: 24 mEq/L (ref 19–32)
Calcium: 9 mg/dL (ref 8.4–10.5)
Creat: 0.75 mg/dL (ref 0.50–1.10)
Glucose, Bld: 174 mg/dL — ABNORMAL HIGH (ref 70–99)
Sodium: 138 mEq/L (ref 135–145)
Total Protein: 6.8 g/dL (ref 6.0–8.3)

## 2013-05-02 LAB — T4, FREE: Free T4: 1.11 ng/dL (ref 0.80–1.80)

## 2013-05-02 LAB — LIPID PANEL
HDL: 58 mg/dL (ref >39)
Total CHOL/HDL Ratio: 3.2 Ratio

## 2013-05-02 LAB — MICROALBUMIN / CREATININE URINE RATIO: Microalb, Ur: 0.5 mg/dL (ref 0.00–1.89)

## 2013-05-02 LAB — TSH: TSH: 3.942 u[IU]/mL (ref 0.350–4.500)

## 2013-05-10 ENCOUNTER — Encounter: Payer: Self-pay | Admitting: "Endocrinology

## 2013-05-10 ENCOUNTER — Ambulatory Visit (INDEPENDENT_AMBULATORY_CARE_PROVIDER_SITE_OTHER): Payer: BC Managed Care – PPO | Admitting: "Endocrinology

## 2013-05-10 VITALS — BP 114/70 | HR 67 | Wt 176.0 lb

## 2013-05-10 DIAGNOSIS — E11649 Type 2 diabetes mellitus with hypoglycemia without coma: Secondary | ICD-10-CM

## 2013-05-10 DIAGNOSIS — E049 Nontoxic goiter, unspecified: Secondary | ICD-10-CM

## 2013-05-10 DIAGNOSIS — E038 Other specified hypothyroidism: Secondary | ICD-10-CM

## 2013-05-10 DIAGNOSIS — I10 Essential (primary) hypertension: Secondary | ICD-10-CM

## 2013-05-10 DIAGNOSIS — E063 Autoimmune thyroiditis: Secondary | ICD-10-CM

## 2013-05-10 DIAGNOSIS — E1169 Type 2 diabetes mellitus with other specified complication: Secondary | ICD-10-CM

## 2013-05-10 DIAGNOSIS — E1065 Type 1 diabetes mellitus with hyperglycemia: Secondary | ICD-10-CM

## 2013-05-10 DIAGNOSIS — B353 Tinea pedis: Secondary | ICD-10-CM

## 2013-05-10 LAB — POCT GLYCOSYLATED HEMOGLOBIN (HGB A1C): Hemoglobin A1C: 7.9

## 2013-05-10 MED ORDER — SYNTHROID 25 MCG PO TABS: ORAL_TABLET | ORAL | Status: DC

## 2013-05-10 NOTE — Patient Instructions (Signed)
Follow up visit during the Christmas vacation.

## 2013-05-10 NOTE — Progress Notes (Signed)
CC: FU T1DM, hypoglycemia, hypothyroid, thyroiditis, and goiter  HPI: 22 y.o. Caucasian young woman  1. The patient developed polyuria, polydipsia, excessive thirst, and a difficult to treat yeast vaginitis in February and March 2011. Because her sister had T1DM and the mother recognized the signs and symptoms of DM, the mother did a CBG at home. That value was 592. Mother took the young woman to the ED where her glucose was 507. She was given a dose of insulin and sent home, with instructions to call her doctor. Instead, mother called me and I saw the patient the next day, 01/04/10. She looked tired, was dehydrated, and had a 20-25 gram goiter. Her HbA1c was 9.3%.  I put her on our standard multiple daily injection of insulin (MDI) regimen with Lantus as a basal insulin and Novolog as a mealtime insulin. For details of this plan, please see my office visit note from 04/29/11.  2. During the past 3 years Traci Rivas has done fairly well overall. Traci Rivas's last [facility] visit was on 02/15/13. She has been healthy. Her Lantus dose is 30 units at bedtime. Her Novolog plan remains the 150/50/15 plan, but she often gives additional Novolog for certain foods, such as cereals. She continues on Synthroid, 25 mcg/day and on Amethia oral contraceptive daily as well. She uses ketoconazole for her feet as needed. 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 She has been very busy and has not been doing much physical activity for the past several months. .    3. Pertinent Review of Systems: Constitutional: The patient feels "good", is healthy, and has no significant complaints. Eyes: Vision is good. There are no significant eye complaints. Her last eye exam was in March of this year. No signs of DM were noted. Neck: The patient has no complaints of anterior neck swelling, soreness, tenderness,  pressure, discomfort, or difficulty swallowing.  Heart: Heart rate  increases with exercise or other physical activity. The patient has no complaints of palpitations, irregular heat beats, chest pain, or chest pressure. Gastrointestinal: Bowel movents seem normal. The patient has no complaints of excessive hunger, acid reflux, upset stomach, stomach aches or pains, diarrhea, or constipation. Legs: Muscle mass and strength seem normal. There are no complaints of numbness, tingling, burning, or pain. No edema is noted. Feet: There are no obvious foot problems, except for some worsening of her tinea pedis. There are no complaints of numbness, tingling, burning, or pain. No edema is noted. Hypoglycemia: She has had occasional nocturnal hypoglycemia, sometimes associated with incorrect carb counts. She usually takes her bedtime snack if her bedtime BG is < 200. She usually does subtract 50-100 points after exercise or physical activity.    GYN: She is on a form of birth control pills that causes menstrual periods to occur only once every three months.  Her last menstrual period was about 2 months ago.   4. BG printout: Traci Rivas is checking BGs 3-6 times daily when she is at home, but when she was at camp in June she did not check as frequently. She is doing very well at checking BGs at bedtime. Her BGs are still quite variable, ranging from 70-481, compared with 58-416 at last visit. Lunch BGs were 141-331, compared with 103-188. Supper BGs ranged from 88-384, compared with  131-263. Bedtime BGs were 110-417, compared with 85-139 (only 2 bedtime BG checks at last visit). She often takes extra Novolog at  bedtime, sometimes resulting in nocturnal hypoglycemia. She also sometimes takes food doses, but not correction doses. Mean BG was 215.5.   PAST MEDICAL, FAMILY, AND SOCIAL HISTORY: 1.School: As above. 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. 2. Activities: She is fairly active at work, but has not been exercising very often. 3.  Primary care provider: Dr. Loreen Freud, [facility]   REVIEW OF SYSTEMS: There are no other significant problems involving her other body systems.  PHYSICAL EXAM: BP 114/70  Pulse 67  Wt 176 lb (79.833 kg)  BMI 31.18 kg/m2  Constitutional: The patient looks healthy and appears physically and emotionally well. She has gained 1 pound since last visit.  Eyes: There is no arcus or proptosis.  Mouth: The oropharynx appears normal. The tongue appears normal. There is normal oral moisture. There is no obvious gingivitis. Neck: There are no bruits present. The thyroid gland appears more enlarged on the right side than on the left. The thyroid gland is approximately 23-25 grams in size. The consistency of the thyroid gland is relatively firm. There was no tenderness to palpation of the thyroid gland today.  Lungs: The lungs are clear. Air movement is good. Heart: The heart rhythm and rate appear normal. Heart sounds S1 and S2 are normal. I do not appreciate any pathologic heart murmurs. Abdomen: The abdomen is enlarged. Bowel sounds are normal. The abdomen is soft and non-tender. There is no obviously palpable hepatomegaly, splenomegaly, or other masses.  Arms: Muscle mass appears appropriate for age.  Hands: There is no obvious tremor. Phalangeal and metacarpophalangeal joints appear normal. Palms are normal. Legs: Muscle mass appears appropriate for age. There is no edema.  Feet: There are no significant deformities. Dorsalis pedis pulses are normal 2+ bilaterally. She also has trace tinea pedis of her feet and 1+ calluses. Neurologic: Muscle strength is normal for age and gender  in both the upper and the lower extremities. Muscle tone appears normal. Sensation to touch is normal in the legs and feet.  Labs: Hemoglobin A1c today was 7.9% today, compared with  9.3% at last visit and with 7.8% at the prior visit. 05/02/13: Cholesterol 187, triglycerides 87, HDL 58, LDL  112; CMP normal, except glucose 174; urinary microalbumin/creatinine ratio was 2.5.; TSH 3.942, free T4 1.11, free T3 3.5 04/27/12. TSH was 1.620. Free T4 was 1.02. Free T3 was 2.8. This was on her Synthroid dose of 25 mcg per day.   ASSESSMENT: 1. T1DM: Overall her BG control is better and she is not having as many low BGs. Her compliance with checking BGs and taking correction doses has increased nicely. She is a very religious young woman who believes that God will take care of her.  2. Hypoglycemia: She is having less hypoglycemia overall. She has had some episodes following taking too much Novolog at bedtime.    3. Hypothyroid: She is hypothyroid again now.   4. Goiter: Essentially unchanged in size since last visit. 5. Thyroiditis: Clinically quiescent today, but intermittently active..  6. Tinea pedis: This problem has improved greatly since she resumed using the ketoconazole.  7. Hypertension:  Her BP is better today. Daily exercise will help.  8. Non-compliance: She is doing a much better job of checking BGs regularly.   PLAN: 1. Diagnostic: TFTs in two months. Send in BG log in about one month. 2. Therapeutic: Get back on plan. Continue Lantus dose of 30 units. We'll  adjust Lantus doses based upon her BG numbers. Follow the bedtime snack table and bedtime sliding scale table. Continue the current Novolog insulin plan, but adjust doses as needed. As much as possible, check BGs before eating. Increase Synthroid to 37.5 mcg/day. Try not to over-treat low BGs. Apply ketoconazole 2% cream, to her feet once daily. Continue lisinopril. 2.5 mg, each AM. 3. Patient education: We discussed the use of lisinopril for renal protection, but also the warning not to get pregnant while on lisinopril. We also discussed the long-term adverse effects of higher BGs. Traci Rivas knows what to do to control her BGs.. 4. Follow-up: six months  Level of Service: This visit lasted in excess of 55 minutes. More than  50% of the visit was devoted to counseling.  David Stall

## 2013-05-25 ENCOUNTER — Other Ambulatory Visit: Payer: Self-pay | Admitting: *Deleted

## 2013-05-25 DIAGNOSIS — E1065 Type 1 diabetes mellitus with hyperglycemia: Secondary | ICD-10-CM

## 2013-05-25 MED ORDER — INSULIN GLARGINE 100 UNIT/ML SOLOSTAR PEN: PEN_INJECTOR | SUBCUTANEOUS | Status: DC

## 2013-06-13 ENCOUNTER — Other Ambulatory Visit: Payer: Self-pay | Admitting: *Deleted

## 2013-06-13 DIAGNOSIS — E1065 Type 1 diabetes mellitus with hyperglycemia: Secondary | ICD-10-CM

## 2013-06-13 MED ORDER — INSULIN GLARGINE 100 UNIT/ML SOLOSTAR PEN: PEN_INJECTOR | SUBCUTANEOUS | Status: DC

## 2013-06-15 ENCOUNTER — Telehealth: Payer: Self-pay | Admitting: "Endocrinology

## 2013-06-15 NOTE — Telephone Encounter (Signed)
pt is out of, Insulin Glargine (LANTUS SOLOSTAR) 100 UNIT/ML SOPN, PLEASE CALL IN RX TO [facility] ON [address] IN [facility] [address] [place], [place] [address]  [phone]

## 2013-08-09 ENCOUNTER — Other Ambulatory Visit: Payer: Self-pay | Admitting: *Deleted

## 2013-08-09 DIAGNOSIS — E1065 Type 1 diabetes mellitus with hyperglycemia: Secondary | ICD-10-CM

## 2013-08-09 MED ORDER — INSULIN ASPART 100 UNIT/ML FLEXPEN: PEN_INJECTOR | SUBCUTANEOUS | Status: DC

## 2013-08-16 ENCOUNTER — Other Ambulatory Visit: Payer: Self-pay | Admitting: *Deleted

## 2013-08-16 DIAGNOSIS — E038 Other specified hypothyroidism: Secondary | ICD-10-CM

## 2013-09-28 ENCOUNTER — Other Ambulatory Visit: Payer: Self-pay | Admitting: *Deleted

## 2013-09-28 DIAGNOSIS — E038 Other specified hypothyroidism: Secondary | ICD-10-CM

## 2013-10-14 LAB — TSH: TSH: 3.138 u[IU]/mL (ref 0.350–4.500)

## 2013-10-18 ENCOUNTER — Ambulatory Visit (INDEPENDENT_AMBULATORY_CARE_PROVIDER_SITE_OTHER): Payer: BC Managed Care – PPO | Admitting: Women's Health

## 2013-10-18 ENCOUNTER — Other Ambulatory Visit (HOSPITAL_COMMUNITY)
Admission: RE | Admit: 2013-10-18 | Discharge: 2013-10-18 | Disposition: A | Discharge status: Home / Self Care | Payer: BC Managed Care – PPO | Source: Ambulatory Visit | Attending: Women's Health | Admitting: Women's Health

## 2013-10-18 ENCOUNTER — Encounter: Payer: Self-pay | Admitting: Women's Health

## 2013-10-18 VITALS — BP 105/65 | Ht 63.0 in | Wt 189.0 lb

## 2013-10-18 DIAGNOSIS — Z01419 Encounter for gynecological examination (general) (routine) without abnormal findings: Secondary | ICD-10-CM | POA: Insufficient documentation

## 2013-10-18 DIAGNOSIS — Z1151 Encounter for screening for human papillomavirus (HPV): Secondary | ICD-10-CM | POA: Insufficient documentation

## 2013-10-18 DIAGNOSIS — B3731 Acute candidiasis of vulva and vagina: Secondary | ICD-10-CM

## 2013-10-18 DIAGNOSIS — B373 Candidiasis of vulva and vagina: Secondary | ICD-10-CM

## 2013-10-18 LAB — HM DIABETES EYE EXAM

## 2013-10-18 MED ORDER — TERCONAZOLE 0.4 % VA CREA: 1.0000 | TOPICAL_CREAM | Freq: Every day | VAGINAL | Status: DC

## 2013-10-18 MED ORDER — LEVONORGEST-ETH ESTRAD 91-DAY 0.15-0.03 &0.01 MG PO TABS: 1.0000 | ORAL_TABLET | Freq: Every day | ORAL | Status: DC

## 2013-10-18 NOTE — Patient Instructions (Signed)

## 2013-10-18 NOTE — Progress Notes (Signed)
Traci Rivas 20-Jan-1991 [id number]    History:    The patient presents for annual exam.  Cycles every 3 months on Seasonale with no complaints. Virgin. Gardasil series completed. Hypothyroid and type 1 diabetes. 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  Past medical history, past surgical history, family history and social history were all reviewed and documented in the EPIC chart.   ROS:  A  ROS was performed and pertinent positives and negatives are included in the history.  Exam:  Filed Vitals:   10/18/13 0959  BP: 105/65    General appearance:  Normal Head/Neck:  Normal, without cervical or supraclavicular adenopathy. Thyroid:  Symmetrical, normal in size, without palpable masses or nodularity. Respiratory  Effort:  Normal  Auscultation:  Clear without wheezing or rhonchi Cardiovascular  Auscultation:  Regular rate, without rubs, murmurs or gallops  Edema/varicosities:  Not grossly evident Abdominal  Soft,nontender, without masses, guarding or rebound.  Liver/spleen:  No organomegaly noted  Hernia:  None appreciated  Skin  Inspection:  Grossly normal  Palpation:  Grossly normal Neurologic/psychiatric  Orientation:  Normal with appropriate conversation.  Mood/affect:  Normal  Genitourinary    Breasts: Examined lying and sitting.     Right: Without masses, retractions, discharge or axillary adenopathy.     Left: Without masses, retractions, discharge or axillary adenopathy.   Inguinal/mons:  Normal without inguinal adenopathy  External genitalia:  Normal  BUS/Urethra/Skene's glands:  Normal  Bladder:  Normal  Vagina:  Normal  Cervix:  Normal  Uterus:   normal in size, shape and contour.  Midline and mobile  Adnexa/parametria:     Rt: Without masses or tenderness.   Lt: Without masses or tenderness.  Anus and perineum: Normal   Assessment/Plan:  22 y.o. SWF virgin for annual exam with no complaints.  Normal GYN  exam Type 1 diabetes/hypothyroid-Dr. Fransico Michael manages labs and meds  Plan: SBE's, regular exercise, calcium rich diet, MVI daily encouraged. Pap. Seasonale prescription, proper use given and reviewed slight risk for blood clots and strokes, has had good relief of dysmenorrhea and menorrhagia. Condoms encouraged if become sexually active. Prescription for Terazol 3 given, uses externally for occasional  itching.    [name] WHNP, 1:29 PM 10/18/2013

## 2013-10-26 ENCOUNTER — Ambulatory Visit (INDEPENDENT_AMBULATORY_CARE_PROVIDER_SITE_OTHER): Payer: BC Managed Care – PPO | Admitting: "Endocrinology

## 2013-10-26 ENCOUNTER — Telehealth: Payer: Self-pay

## 2013-10-26 ENCOUNTER — Encounter: Payer: Self-pay | Admitting: "Endocrinology

## 2013-10-26 VITALS — BP 147/76 | HR 97 | Wt 185.2 lb

## 2013-10-26 DIAGNOSIS — E1065 Type 1 diabetes mellitus with hyperglycemia: Secondary | ICD-10-CM

## 2013-10-26 DIAGNOSIS — E663 Overweight: Secondary | ICD-10-CM

## 2013-10-26 DIAGNOSIS — E049 Nontoxic goiter, unspecified: Secondary | ICD-10-CM

## 2013-10-26 DIAGNOSIS — E063 Autoimmune thyroiditis: Secondary | ICD-10-CM

## 2013-10-26 DIAGNOSIS — E1169 Type 2 diabetes mellitus with other specified complication: Secondary | ICD-10-CM

## 2013-10-26 DIAGNOSIS — E038 Other specified hypothyroidism: Secondary | ICD-10-CM

## 2013-10-26 DIAGNOSIS — E11649 Type 2 diabetes mellitus with hypoglycemia without coma: Secondary | ICD-10-CM

## 2013-10-26 DIAGNOSIS — I10 Essential (primary) hypertension: Secondary | ICD-10-CM

## 2013-10-26 NOTE — Telephone Encounter (Signed)
Patient called stating she was just at another doctor who could see her results is EPIC and told her that her pap smear had some atypical cells on it and she should check with gyn office.  I explained to her that [place] had not reviewed it to make recommendation yet but I reassured her that atypical cells of this nature not usually a problem especially in light of the neg HPV testing.  I told her that atypia can be transient and may be gone with next pap.  I told her what usually happens is she will repeat a pap in one year. Iexplained that this was not her provider's recommendation at this point but  I told her as soon as [place] reviews and recommends f/u I will let her know.  (She was flying out returning to [place] on Friday.)

## 2013-10-26 NOTE — Progress Notes (Signed)
CC: FU T1DM, hypoglycemia, overweight, hypothyroid, thyroiditis, and goiter  HPI: 22 y.o. Caucasian young woman  1. The patient developed polyuria, polydipsia, excessive thirst, and a difficult to treat yeast vaginitis in February and March 2011. Because her sister had T1DM and the mother recognized the signs and symptoms of DM, the mother did a CBG at home. That value was 592. Mother took the young woman to the ED where her glucose was 507. She was given a dose of insulin and sent home, with instructions to call her doctor. Instead, mother called me and I saw the patient the next day, 01/04/10. She looked tired, was dehydrated, and had a 20-25 gram goiter. Her HbA1c was 9.3%.  I put her on our standard multiple daily injection of insulin (MDI) regimen with Lantus as a basal insulin and Novolog as a mealtime insulin. For details of this plan, please see my office visit note from 04/29/11.  2. During the past 3 years Traci Rivas has done fairly well overall. Traci Rivas's last PSSG visit was on 05/10/13. She has been healthy, but has gained more than ten pounds in weight. 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 She loves it out in [place].  Her Lantus dose is 32 units at bedtime. Her Novolog plan remains the 150/50/15 plan, but she often gives additional Novolog for certain foods, such as cereals. She continues on Synthroid, 37.5 mcg/day, on lisinopril, 2.5 mg/day, and on Amethia oral contraceptive daily as well. She uses ketoconazole for her feet as needed. She has been very busy and has not been doing much physical activity for the past six months. She wants to focus more on better BG control, exercise, and weight loss in the coming semester.     3. Pertinent Review of Systems: Constitutional: The patient feels "good", is healthy, and has no significant complaints. Eyes: Vision is good. There are no significant eye complaints. Her last eye exam  was last week. No signs of DM were noted. Neck: The patient has no complaints of anterior neck swelling, soreness, tenderness,  pressure, discomfort, or difficulty swallowing.  Heart: Heart rate increases with exercise or other physical activity. The patient has no complaints of palpitations, irregular heat beats, chest pain, or chest pressure. Gastrointestinal: Bowel movents seem normal. The patient has no complaints of excessive hunger, acid reflux, upset stomach, stomach aches or pains, diarrhea, or constipation. Legs: Muscle mass and strength seem normal. There are no complaints of numbness, tingling, burning, or pain. No edema is noted. Feet: There are no obvious foot problems, except for some worsening of her tinea pedis. There are no complaints of numbness, tingling, burning, or pain. No edema is noted. Hypoglycemia: She has not had much hypoglycemia. She usually takes her bedtime snack if her bedtime BG is < 200.    GYN: She is on a form of birth control pills that causes menstrual periods to occur only once every three months.  Her last menstrual period was about 2 weeks ago.   4. BG printout: She has been home for two weeks, but did not change the time on her meter, so BGs are off by 3 hours in comparing [place] time to [place] time. Because of this discrepancy we just evaluated her BGs in [place] from 09/27/13 -10/09/13. Traci Rivas is checking BGs 2-8 times daily, mostly 3-4 times per day.  Sometime she can go for up to 14 hours without BG checks or insulin doses. The longer  she goes between BG checks and doses, the more variable her BGs will be.  Her BGs are still quite variable, ranging from 70-481, compared with 58-416 at last visit. Lunch BGs were 51-411. BGs late at night or in the early morning hours are the most variable based upon her work and study hours and her snacks.    PAST MEDICAL, FAMILY, AND SOCIAL HISTORY: 1.School: As above. 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 2. Activities: She is fairly active at work, but has not been exercising very often. 3. Primary care provider: Dr. Loreen Freud, [facility]   REVIEW OF SYSTEMS: There are no other significant problems involving her other body systems.  PHYSICAL EXAM: BP 147/76  Pulse 97  Wt 185 lb 3.2 oz (84.006 kg)  LMP 10/09/2013  Constitutional: The patient looks healthy and appears physically and emotionally well. She has gained 9 pounds since last visit 5 months ago, equivalent to a net calorie excess of 205 calories per day.   Eyes: There is no arcus or proptosis.  Mouth: The oropharynx appears normal. The tongue appears normal. There is normal oral moisture. There is no obvious gingivitis. Neck: There are no bruits present. The thyroid gland appears enlarged. Both lobes are fairly symmetrically enlarged. The thyroid gland is again approximately 23-25 grams in size. The consistency of the thyroid gland is only a little firm. There was no tenderness to palpation of the thyroid gland today.  Lungs: The lungs are clear. Air movement is good. Heart: The heart rhythm and rate appear normal. Heart sounds S1 and S2 are normal. I do not appreciate any pathologic heart murmurs. Abdomen: The abdomen is enlarged. Bowel sounds are normal. The abdomen is soft and non-tender. There is no obviously palpable hepatomegaly, splenomegaly, or other masses.  Arms: Muscle mass appears appropriate for age.  Hands: There is no obvious tremor. Phalangeal and metacarpophalangeal joints appear normal. Palms are normal. Legs: Muscle mass appears appropriate for age. There is no edema.  Feet: There are no significant deformities. Dorsalis pedis pulses are normal 2+ bilaterally. She also has 2+ tinea pedis of her heels and 1+ calluses. Neurologic: Muscle strength is normal for age and gender in both the upper and the lower extremities. Muscle tone appears  normal. Sensation to touch is normal in the legs and feet.  Labs: Hemoglobin A1c today was 8.8% today, compared with  7.9% at last visit and with 9.3% at the prior visit. 10/18/13: PAP smear: Atypical squamous cells of undetermined significance - I suggested she contact her GYN to discuss this result.  10/13/13: TSH 3.138, free T4 1.04, free T3 2.9 05/02/13: Cholesterol 187, triglycerides 87, HDL 58, LDL 112; CMP normal, except glucose 174; urinary microalbumin/creatinine ratio was 2.5.; TSH 3.942, free T4 1.11, free T3 3.5 04/27/12. TSH was 1.620. Free T4 was 1.02. Free T3 was 2.8. This was on her Synthroid dose of 25 mcg per day.   ASSESSMENT: 1. T1DM: Overall her BG control is worse. Her schedule has been very hectic and she has not been as adherent to her DM care plan as she might have been.  2. Hypoglycemia: She is having much less hypoglycemia overall. She has had some episodes following taking too much Novolog at bedtime or following long periods of time without checking BGs and eating.    3. Hypothyroid: She is hypothyroid again now.   4. Goiter: Essentially unchanged in size since  last visit. 5. Thyroiditis: Clinically quiescent today, but intermittently active..  6. Tinea pedis: This problem had improved greatly after using ketoconazole more often, but has regressed somewhat since last visit after not using the ketoconazole very often.  7. Hypertension:  Her BP is worse today, paralleling her weight gain. Eating Right and daily exercise will help.  8. Overweight: She recognizes that she has to eat more sensibly and exercise regularly in order to bring her weight down to where it should be.  9. Non-compliance: She is not doing as well as she did at last visit, but is doing better than one year ago. Life often gets in the way of better DM care.  PLAN: 1. Diagnostic: Send in BG log in about one month. 2. Therapeutic: Get back on plan. Continue Lantus dose of 32 units. We'll adjust Lantus  doses based upon her BG numbers. Follow the bedtime snack table and bedtime sliding scale table. Continue the current Novolog insulin plan, but adjust doses as needed. As much as possible, check BGs before eating. Increase Synthroid to 50 mcg/day. Repeat the TFTs in 2 months. Try not to over-treat low BGs. Apply ketoconazole 2% cream, to her feet once daily. Continue lisinopril. 2.5 mg, each AM. 3. Patient education: We discussed the use of lisinopril for renal protection, but also the warning not to get pregnant while on lisinopril. We also discussed the long-term adverse effects of higher BGs. Traci Rivas knows what to do to control her BGs.. 4. Follow-up: 8 months  Level of Service: This visit lasted in excess of 55 minutes. More than 50% of the visit was devoted to counseling.  David Stall

## 2013-10-26 NOTE — Patient Instructions (Signed)
Follow up visit in 8 months or sooner if she returns home earlier.Marland Kitchen

## 2013-10-27 DIAGNOSIS — E663 Overweight: Secondary | ICD-10-CM | POA: Insufficient documentation

## 2013-10-31 NOTE — Telephone Encounter (Signed)
Left detailed message on voicemail.  

## 2013-10-31 NOTE — Telephone Encounter (Signed)
Please call and review I did see and the ascus had negative HR HPV so no further treatment needed.  Will repeat pap in 2 years.  Nothing to worry about. (Virgin and received gardasil also) New pap guidelines came out in 2013, which has caused some confusion.

## 2014-01-27 ENCOUNTER — Telehealth: Payer: Self-pay | Admitting: "Endocrinology

## 2014-01-27 DIAGNOSIS — IMO0002 Reserved for concepts with insufficient information to code with codable children: Secondary | ICD-10-CM

## 2014-01-27 DIAGNOSIS — E1065 Type 1 diabetes mellitus with hyperglycemia: Secondary | ICD-10-CM

## 2014-01-27 MED ORDER — GLUCOSE BLOOD VI STRP: ORAL_STRIP | Status: DC

## 2014-01-27 NOTE — Telephone Encounter (Signed)
1. Patient called. She needs BG test strips.  2. When I attempted to return her call she was not available. I left a voicemail message that as of last night we had not receive any requests for test strip refills. Since I don't know whether she is at home in [place] or is still in [place], I don't know where to send the refill order. I asked her to call our answering service. In case she is still in [place], I sent in an e-scrip for a year's supply of Verio test strips to her [facility] pharmacy [place].  David StallBRENNAN,Laquitta Dominski J

## 2014-03-15 ENCOUNTER — Encounter: Payer: Self-pay | Admitting: Family Medicine

## 2014-03-15 ENCOUNTER — Ambulatory Visit (INDEPENDENT_AMBULATORY_CARE_PROVIDER_SITE_OTHER): Payer: BC Managed Care – PPO | Admitting: Family Medicine

## 2014-03-15 VITALS — BP 110/70 | HR 79 | Temp 98.2°F | Resp 16 | Wt 187.0 lb

## 2014-03-15 DIAGNOSIS — M25531 Pain in right wrist: Secondary | ICD-10-CM | POA: Insufficient documentation

## 2014-03-15 DIAGNOSIS — M25539 Pain in unspecified wrist: Secondary | ICD-10-CM

## 2014-03-15 MED ORDER — MELOXICAM 15 MG PO TABS: 15.0000 mg | ORAL_TABLET | Freq: Every day | ORAL | Status: DC

## 2014-03-15 NOTE — Patient Instructions (Signed)
Follow up as needed Start the Mobic daily x2 weeks (take w/ food) for pain and inflammation You can add tylenol as needed, no additional anti-inflammatories We do not have a wrist splint- go to [facility] or [facility] and buy an OTC wrist support and wear it when working or with activity Call if no improvement in the next 2 weeks and we'll have you see Sports Med [place]! Happy Early [place] Ouch[place]irthday!!!

## 2014-03-15 NOTE — Progress Notes (Signed)
Pre visit review using our clinic review tool, if applicable. No additional management support is needed unless otherwise documented below in the visit note. 

## 2014-03-15 NOTE — Progress Notes (Signed)
   Subjective:    Patient ID: Traci Rivas, female    DOB: 1990/11/20, 23 y.o.   MRN: [id number]  HPI R wrist pain- pt was moving 1 month ago and 'i think i picked up a box weird and it has been bothering me ever since'.  Last week, helped a friend move and it was painful and 'popped'.  Wrist is now popping w/ certain movements- painful.  Not swollen or bruised.  Some previous swelling.   Review of Systems For ROS see HPI     Objective:   Physical Exam  Vitals reviewed. Constitutional: She appears well-developed and well-nourished. No distress.  Cardiovascular: Intact distal pulses.   Musculoskeletal: Normal range of motion. She exhibits tenderness (over R carpals). She exhibits no edema.  Full extension, flexion, medial/lateral deviation of R wrist  Skin: Skin is warm and dry.          Assessment & Plan:

## 2014-03-15 NOTE — Assessment & Plan Note (Signed)
New.  No evidence of fracture or bony injury- no need for xray.  Suspect sprain/strain.  Start scheduled NSAIDs, immobilization.  If no improvement in 2 weeks, will refer to Sports Med.  Pt expressed understanding and is in agreement w/ plan.

## 2014-04-07 ENCOUNTER — Other Ambulatory Visit: Payer: Self-pay | Admitting: *Deleted

## 2014-04-07 DIAGNOSIS — IMO0002 Reserved for concepts with insufficient information to code with codable children: Secondary | ICD-10-CM

## 2014-04-07 DIAGNOSIS — E1065 Type 1 diabetes mellitus with hyperglycemia: Secondary | ICD-10-CM

## 2014-04-07 MED ORDER — INSULIN ASPART 100 UNIT/ML FLEXPEN: PEN_INJECTOR | SUBCUTANEOUS | Status: DC

## 2014-06-21 ENCOUNTER — Encounter: Payer: Self-pay | Admitting: Family Medicine

## 2014-06-21 ENCOUNTER — Ambulatory Visit (INDEPENDENT_AMBULATORY_CARE_PROVIDER_SITE_OTHER): Payer: BC Managed Care – PPO | Admitting: Family Medicine

## 2014-06-21 VITALS — BP 112/66 | HR 96 | Temp 98.2°F | Wt 183.0 lb

## 2014-06-21 DIAGNOSIS — H60399 Other infective otitis externa, unspecified ear: Secondary | ICD-10-CM

## 2014-06-21 DIAGNOSIS — H60392 Other infective otitis externa, left ear: Secondary | ICD-10-CM

## 2014-06-21 MED ORDER — OFLOXACIN 0.3 % OT SOLN: 10.0000 [drp] | Freq: Every day | OTIC | Status: DC

## 2014-06-21 MED ORDER — AZITHROMYCIN 250 MG PO TABS: ORAL_TABLET | ORAL | Status: DC

## 2014-06-21 NOTE — Patient Instructions (Signed)

## 2014-06-21 NOTE — Progress Notes (Signed)
   Subjective:    Patient ID: Traci Rivas, female    DOB: October 06, 1991, 23 y.o.   MRN: [id number]  HPI Pt here c/o L ear pain.  She had some congestion last week.  She is going out of town and wanted to be checked before she left tomorrow.     Review of Systems As above    Objective:   Physical Exam BP 112/66  Pulse 96  Temp(Src) 98.2 F (36.8 C) (Oral)  Wt 183 lb (83.008 kg)  SpO2 97% General appearance: alert, cooperative, appears stated age and no distress Ears: L ear-- canal slighlty red,  TM normal Nose: Nares normal. Septum midline. Mucosa normal. No drainage or sinus tenderness. Throat: abnormal findings: mild oropharyngeal erythema Neck: no adenopathy, supple, symmetrical, trachea midline and thyroid not enlarged, symmetric, no tenderness/mass/nodules Lungs: clear to auscultation bilaterally Heart: S1, S2 normal       Assessment & Plan:  1. Otitis, externa, infective, left Pt will take and antihistamine and use drops-- if symptoms worsen while she is out of town she will fill zithromax - ofloxacin (FLOXIN) 0.3 % otic solution; Place 10 drops into the left ear daily.  Dispense: 5 mL; Refill: 0 - azithromycin (ZITHROMAX Z-PAK) 250 MG tablet; As directed  Dispense: 6 each; Refill: 0

## 2014-06-21 NOTE — Progress Notes (Signed)
Pre visit review using our clinic review tool, if applicable. No additional management support is needed unless otherwise documented below in the visit note. 

## 2014-06-28 ENCOUNTER — Other Ambulatory Visit: Payer: Self-pay | Admitting: *Deleted

## 2014-06-28 DIAGNOSIS — E1065 Type 1 diabetes mellitus with hyperglycemia: Secondary | ICD-10-CM

## 2014-06-28 DIAGNOSIS — IMO0002 Reserved for concepts with insufficient information to code with codable children: Secondary | ICD-10-CM

## 2014-06-28 MED ORDER — INSULIN GLARGINE 100 UNIT/ML SOLOSTAR PEN: PEN_INJECTOR | SUBCUTANEOUS | Status: DC

## 2014-10-24 ENCOUNTER — Ambulatory Visit (INDEPENDENT_AMBULATORY_CARE_PROVIDER_SITE_OTHER): Payer: BC Managed Care – PPO | Admitting: Women's Health

## 2014-10-24 ENCOUNTER — Encounter: Payer: Self-pay | Admitting: Women's Health

## 2014-10-24 VITALS — BP 124/80 | Wt 188.0 lb

## 2014-10-24 DIAGNOSIS — E1065 Type 1 diabetes mellitus with hyperglycemia: Secondary | ICD-10-CM

## 2014-10-24 DIAGNOSIS — Z3040 Encounter for surveillance of contraceptives, unspecified: Secondary | ICD-10-CM

## 2014-10-24 DIAGNOSIS — E038 Other specified hypothyroidism: Secondary | ICD-10-CM

## 2014-10-24 DIAGNOSIS — Z01419 Encounter for gynecological examination (general) (routine) without abnormal findings: Secondary | ICD-10-CM

## 2014-10-24 DIAGNOSIS — B3731 Acute candidiasis of vulva and vagina: Secondary | ICD-10-CM

## 2014-10-24 DIAGNOSIS — B373 Candidiasis of vulva and vagina: Secondary | ICD-10-CM

## 2014-10-24 DIAGNOSIS — IMO0002 Reserved for concepts with insufficient information to code with codable children: Secondary | ICD-10-CM

## 2014-10-24 DIAGNOSIS — E1069 Type 1 diabetes mellitus with other specified complication: Secondary | ICD-10-CM

## 2014-10-24 DIAGNOSIS — E108 Type 1 diabetes mellitus with unspecified complications: Secondary | ICD-10-CM

## 2014-10-24 LAB — COMPREHENSIVE METABOLIC PANEL
ALT: 18 U/L (ref 0–35)
AST: 17 U/L (ref 0–37)
Albumin: 4 g/dL (ref 3.5–5.2)
Alkaline Phosphatase: 108 U/L (ref 39–117)
BUN: 10 mg/dL (ref 6–23)
CALCIUM: 9.5 mg/dL (ref 8.4–10.5)
CHLORIDE: 99 meq/L (ref 96–112)
CO2: 28 mEq/L (ref 19–32)
CREATININE: 0.71 mg/dL (ref 0.50–1.10)
Glucose, Bld: 194 mg/dL — ABNORMAL HIGH (ref 70–99)
Potassium: 4.3 mEq/L (ref 3.5–5.3)
Sodium: 136 mEq/L (ref 135–145)
Total Bilirubin: 0.7 mg/dL (ref 0.2–1.2)
Total Protein: 6.6 g/dL (ref 6.0–8.3)

## 2014-10-24 LAB — WET PREP FOR TRICH, YEAST, CLUE
CLUE CELLS WET PREP: NONE SEEN
Trich, Wet Prep: NONE SEEN

## 2014-10-24 LAB — CBC WITH DIFFERENTIAL/PLATELET
BASOS ABS: 0.1 10*3/uL (ref 0.0–0.1)
Basophils Relative: 1 % (ref 0–1)
Eosinophils Absolute: 0.1 10*3/uL (ref 0.0–0.7)
Eosinophils Relative: 2 % (ref 0–5)
HCT: 43.7 % (ref 36.0–46.0)
Hemoglobin: 14.8 g/dL (ref 12.0–15.0)
Lymphocytes Relative: 32 % (ref 12–46)
Lymphs Abs: 1.7 10*3/uL (ref 0.7–4.0)
MCH: 31.4 pg (ref 26.0–34.0)
MCHC: 33.9 g/dL (ref 30.0–36.0)
MCV: 92.8 fL (ref 78.0–100.0)
MONO ABS: 0.6 10*3/uL (ref 0.1–1.0)
MPV: 9.9 fL (ref 8.6–12.4)
Monocytes Relative: 11 % (ref 3–12)
Neutro Abs: 2.9 10*3/uL (ref 1.7–7.7)
Neutrophils Relative %: 54 % (ref 43–77)
PLATELETS: 292 10*3/uL (ref 150–400)
RBC: 4.71 MIL/uL (ref 3.87–5.11)
RDW: 12.5 % (ref 11.5–15.5)
WBC: 5.3 10*3/uL (ref 4.0–10.5)

## 2014-10-24 LAB — HEMOGLOBIN A1C
Hgb A1c MFr Bld: 9.2 % — ABNORMAL HIGH (ref <5.7)
Mean Plasma Glucose: 217 mg/dL — ABNORMAL HIGH (ref <117)

## 2014-10-24 MED ORDER — LEVONORGEST-ETH ESTRAD 91-DAY 0.15-0.03 &0.01 MG PO TABS: 1.0000 | ORAL_TABLET | Freq: Every day | ORAL | Status: DC

## 2014-10-24 MED ORDER — TERCONAZOLE 0.8 % VA CREA: 1.0000 | TOPICAL_CREAM | Freq: Every day | VAGINAL | Status: DC

## 2014-10-24 NOTE — Patient Instructions (Signed)

## 2014-10-24 NOTE — Progress Notes (Signed)
Traci Rivas 10-30-1990 [id number]    History:    Presents for annual exam.  Regular monthly cycle. Had been on Seasonale for dysmenorrhea. States cycles are now less painful. Virgin. Normal Pap 2014. Gardasil series completed. Type 1 diabetes and hypothyroid has appointment with Dr. Fransico Rivas in several weeks.  Past medical history, past surgical history, family history and social history were all reviewed and documented in the EPIC chart. 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 Parents healthy.  ROS:  A ROS was performed and pertinent positives and negatives are included.  Exam:  Filed Vitals:   10/24/14 0909  BP: 124/80    General appearance:  Normal Thyroid:  Symmetrical, normal in size, without palpable masses or nodularity. Respiratory  Auscultation:  Clear without wheezing or rhonchi Cardiovascular  Auscultation:  Regular rate, without rubs, murmurs or gallops  Edema/varicosities:  Not grossly evident Abdominal  Soft,nontender, without masses, guarding or rebound.  Liver/spleen:  No organomegaly noted  Hernia:  None appreciated  Skin  Inspection:  Grossly normal   Breasts: Examined lying and sitting.     Right: Without masses, retractions, discharge or axillary adenopathy.     Left: Without masses, retractions, discharge or axillary adenopathy. Gentitourinary   Inguinal/mons:  Normal without inguinal adenopathy  External genitalia:  Normal  BUS/Urethra/Skene's glands:  Normal  Vagina:  Erythematous, wet prep positive for yeast  Cervix:  Normal  Uterus:   normal in size, shape and contour.  Midline and mobile  Adnexa/parametria:     Rt: Without masses or tenderness.   Lt: Without masses or tenderness.  Anus and perineum: Normal    Assessment/Plan:  23 y.o. S WF virgin for annual exam with no complaints.  Normal GYN exam/virgin Type 1 diabetes/hypothyroid-Dr. Fransico Rivas manages  medications. Yeast  Plan: Terazol 3 one applicator at bedtime 3, reviewed importance of better glucose control for prevention. Instructed to call if no relief of symptoms. SBE's, increase regular exercise, calcium rich diet, MVI daily encouraged. Prescription for Seasonale given if cycles become uncomfortable again, start up instructions reviewed, condoms encouraged if sexually active. Reviewed risks of blood clots, strokes. CBC, CMP, hemoglobin A1c, TSH, UA, Pap normal 2014, new screening guidelines reviewed. Instructed to print lab results to take to follow up appointment with Dr. Fransico Rivas.  Traci Rivas, 9:41 AM 10/24/2014

## 2014-10-25 ENCOUNTER — Other Ambulatory Visit: Payer: Self-pay | Admitting: *Deleted

## 2014-10-25 DIAGNOSIS — E1065 Type 1 diabetes mellitus with hyperglycemia: Secondary | ICD-10-CM

## 2014-10-25 DIAGNOSIS — IMO0002 Reserved for concepts with insufficient information to code with codable children: Secondary | ICD-10-CM

## 2014-10-25 LAB — URINALYSIS W MICROSCOPIC + REFLEX CULTURE
BACTERIA UA: NONE SEEN
BILIRUBIN URINE: NEGATIVE
CASTS: NONE SEEN
Glucose, UA: >1000 mg/dL — AB
HGB URINE DIPSTICK: NEGATIVE
Ketones, ur: NEGATIVE mg/dL
Leukocytes, UA: NEGATIVE
NITRITE: NEGATIVE
PH: 6 (ref 5.0–8.0)
Protein, ur: NEGATIVE mg/dL
Specific Gravity, Urine: >1.03 — ABNORMAL HIGH (ref 1.005–1.030)
Squamous Epithelial / LPF: NONE SEEN
Urobilinogen, UA: 0.2 mg/dL (ref 0.0–1.0)

## 2014-10-25 LAB — TSH: TSH: 6.526 u[IU]/mL — AB (ref 0.350–4.500)

## 2014-10-25 MED ORDER — SYNTHROID 25 MCG PO TABS: ORAL_TABLET | ORAL | Status: DC

## 2014-10-25 MED ORDER — GLUCOSE BLOOD VI STRP: ORAL_STRIP | Status: AC

## 2014-10-25 MED ORDER — INSULIN GLARGINE 100 UNIT/ML SOLOSTAR PEN: PEN_INJECTOR | SUBCUTANEOUS | Status: DC

## 2014-10-25 MED ORDER — INSULIN ASPART 100 UNIT/ML FLEXPEN: PEN_INJECTOR | SUBCUTANEOUS | Status: DC

## 2014-11-30 ENCOUNTER — Ambulatory Visit (INDEPENDENT_AMBULATORY_CARE_PROVIDER_SITE_OTHER): Payer: BLUE CROSS/BLUE SHIELD | Admitting: "Endocrinology

## 2014-11-30 ENCOUNTER — Encounter: Payer: Self-pay | Admitting: "Endocrinology

## 2014-11-30 VITALS — BP 114/78 | HR 79 | Wt 188.0 lb

## 2014-11-30 DIAGNOSIS — E10649 Type 1 diabetes mellitus with hypoglycemia without coma: Secondary | ICD-10-CM | POA: Diagnosis not present

## 2014-11-30 DIAGNOSIS — E1065 Type 1 diabetes mellitus with hyperglycemia: Secondary | ICD-10-CM | POA: Diagnosis not present

## 2014-11-30 DIAGNOSIS — E063 Autoimmune thyroiditis: Secondary | ICD-10-CM

## 2014-11-30 DIAGNOSIS — E049 Nontoxic goiter, unspecified: Secondary | ICD-10-CM

## 2014-11-30 DIAGNOSIS — I10 Essential (primary) hypertension: Secondary | ICD-10-CM

## 2014-11-30 DIAGNOSIS — E663 Overweight: Secondary | ICD-10-CM | POA: Diagnosis not present

## 2014-11-30 DIAGNOSIS — B353 Tinea pedis: Secondary | ICD-10-CM

## 2014-11-30 DIAGNOSIS — E038 Other specified hypothyroidism: Secondary | ICD-10-CM

## 2014-11-30 DIAGNOSIS — IMO0002 Reserved for concepts with insufficient information to code with codable children: Secondary | ICD-10-CM

## 2014-11-30 LAB — GLUCOSE, POCT (MANUAL RESULT ENTRY): POC GLUCOSE: 258 mg/dL — AB (ref 70–99)

## 2014-11-30 MED ORDER — LISINOPRIL 2.5 MG PO TABS: 2.5000 mg | ORAL_TABLET | Freq: Every day | ORAL | Status: DC

## 2014-11-30 MED ORDER — KETOCONAZOLE 2 % EX CREA: 1.0000 "application " | TOPICAL_CREAM | Freq: Every day | CUTANEOUS | Status: DC

## 2014-11-30 NOTE — Progress Notes (Signed)
CC: FU T1DM, hypoglycemia, overweight, hypothyroid, thyroiditis, and goiter  HPI: Traci Rivas is a 24 y.o. Caucasian young woman. She was unaccompanied today.  1. The patient developed polyuria, polydipsia, excessive thirst, and a difficult to treat yeast vaginitis in February and March 2011. Because her sister had T1DM and the mother recognized the signs and symptoms of DM, the mother did a CBG at home. That value was 592. Mother took the young woman to the ED where her glucose was 507. She was given a dose of insulin and sent home, with instructions to call her doctor. Instead, mother called me and I saw the patient the next day, 01/04/10. She looked tired, was dehydrated, and had a 20-25 gram goiter. Her HbA1c was 9.3%.  I put her on our standard multiple daily injection of insulin (MDI) regimen with Lantus as a basal insulin and Novolog as a mealtime insulin. For details of this plan, please see my office visit note from 04/29/11.  2. During the past 5 years Traci Rivas has done fairly well overall. She was lost to follow up when she moved out to [place] in 2015, but she returned to [place] about two months ago.   3. Traci Rivas's last PSSG visit was on 10/26/13. She has been healthy, but has gained 3 more pounds in weight. She increased her Lantus dose to 35 units at bedtime. Her Novolog plan remains the 150/50/15 plan, but she often gives additional Novolog for certain foods, such as cereals. She continues on Synthroid, 37.5 mcg/day, but has ben out of lisinopril, 2.5 mg/day. She stopped the Amethia oral contraceptive daily, but may resume taking it soon. She has not been using ketoconazole cream on her feet. She had been very busy and had not been doing much physical activity for some time, but has resumed physical activity recently.  3. Pertinent Review of Systems: Constitutional: The patient feels "pretty good", is healthy, and has no significant complaints. Eyes: Vision is good. There are no significant eye  complaints. Her last eye exam was in December 2014. No signs of DM were noted. She has a follow up appointment soon.  Neck: The patient has no complaints of anterior neck swelling, soreness, tenderness,  pressure, discomfort, or difficulty swallowing.  Heart: Heart rate increases with exercise or other physical activity. The patient has no complaints of palpitations, irregular heat beats, chest pain, or chest pressure. Gastrointestinal: Bowel movents seem normal. The patient has no complaints of excessive hunger, acid reflux, upset stomach, stomach aches or pains, diarrhea, or constipation. Legs: Muscle mass and strength seem normal. There are no complaints of numbness, tingling, burning, or pain. No edema is noted. Feet: There are no obvious foot problems, except for some worsening of her tinea pedis. There are no complaints of numbness, tingling, burning, or pain. No edema is noted. Hypoglycemia: She has not had much hypoglycemia. She usually takes her bedtime snack if her bedtime BG is < 200.    GYN: LMP was 3 weeks ago. Periods have been regular. She is not on birth control now. She has been followed by OB-GYN for birth control and for her previous atypical pap smear.   4. BG printout: Traci Rivas is checking BGs 1-4 times daily, mostly 2-3 times per day. She checks BGs more often when she is not working. Sometime she can go for up to 18-20 hours without BG checks or insulin doses. The longer she goes between BG checks and doses, the more variable her BGs have been. Her average BG is  186.9. Her BGs are still quite variable, ranging from 63-390, compared with 70-481 at last visit and with 58-416 at the prior visit.   PAST MEDICAL, FAMILY, AND SOCIAL HISTORY: 1. Work and home: She is working full-time at [employer]. She is still interested in going into the ministry or into counseling.  2. Activities: She resumed exercising last week.  3. Primary care provider: Dr. Loreen Freud, [facility]  [facility]   REVIEW OF SYSTEMS: There are no other significant problems involving her other body systems.  PHYSICAL EXAM: BP 114/78 mmHg  Pulse 79  Wt 188 lb (85.276 kg)  Constitutional: The patient looks healthy and appears physically and emotionally well. She has gained 3 pounds since last visit.   Eyes: There is no arcus or proptosis.  Mouth: The oropharynx appears normal. The tongue appears normal. There is normal oral moisture. There is no obvious gingivitis. Neck: There are no bruits present. The thyroid gland appears enlarged. Both lobes are fairly symmetrically enlarged. The thyroid gland is smaller today at about 21-22 grams in size. The consistency of the thyroid gland is normal.  There was no tenderness to palpation of the thyroid gland today.  Lungs: The lungs are clear. Air movement is good. Heart: The heart rhythm and rate appear normal. Heart sounds S1 and S2 are normal. I do not appreciate any pathologic heart murmurs. Abdomen: The abdomen is enlarged. Bowel sounds are normal. The abdomen is soft and non-tender. There is no obviously palpable hepatomegaly, splenomegaly, or other masses.  Arms: Muscle mass appears appropriate for age.  Hands: There is no obvious tremor. Phalangeal and metacarpophalangeal joints appear normal. Palms are normal. Legs: Muscle mass appears appropriate for age. There is no edema.  Feet: There are no significant deformities. Dorsalis pedis pulses are normal 2+ bilaterally. She also has 2+ tinea pedis of her heels and 1+ calluses. Neurologic: Muscle strength is normal for age and gender in both the upper and the lower extremities. Muscle tone appears normal. Sensation to touch is normal in the legs and feet.  Labs:   10/24/14: HbA1c 9.2%, compared with 8.8% at last visit and with 7.9% at the visit prior.  10/18/13: PAP smear: Atypical squamous cells of undetermined significance - I suggested she contact her GYN to discuss this result.    10/13/13: TSH 3.138, free T4 1.04, free T3 2.9  05/02/13: Cholesterol 187, triglycerides 87, HDL 58, LDL 112; CMP normal, except glucose 174; urinary microalbumin/creatinine ratio was 2.5.; TSH 3.942, free T4 1.11, free T3 3.5  04/27/12. TSH was 1.620. Free T4 was 1.02. Free T3 was 2.8. This was on her Synthroid dose of 25 mcg per day.   ASSESSMENT: 1. T1DM: Overall her BG control is worse. She needs to really take charge of her lifestyle and her T1DM. She has not been as adherent to her DM care plan as she knows that she should have been.  2. Hypoglycemia: She is not having much hypoglycemia overall, in large part because her BGs have been too high. 3. Hypothyroid: She was borderline hypothyroid at her last visit. We need to check TFTs now.    4. Goiter: The thyroid gland is smaller. The waxing and waning of thyroid gland size is c/w evolving Hashimoto's thyroiditis.  5. Thyroiditis: Clinically quiescent today, but intermittently active..  6. Tinea pedis: This problem had improved greatly after using ketoconazole more often, but has worsened since she stopped using the ketoconazole.   7. Hypertension:  Her systolic BP is  better, but her diastolic BP is worse. She needs to re-start lisinopril and exercise daily.   8. Overweight: She recognizes that she has to eat more sensibly and exercise regularly in order to bring her weight down to where it should be.  9. Non-compliance: She is not doing as well as she did at last visit. I told her that the metabolic syndrome is causing more MIs and strokes in younger adults. I told her that she needs to take better care of herself.    PLAN: 1. Diagnostic: Annual surveillance labs this week. Call in 2-4 weeks with BG results. Repeat TFTs in 3 months.  2. Therapeutic: Get back on plan. Continue Lantus dose of 35 units. We'll adjust Lantus doses based upon her BG numbers. Follow the bedtime snack table and bedtime sliding scale table. Continue the current  Novolog insulin plan, but adjust doses as needed. As much as possible, check BGs before eating and at bedtime. Continue Synthroid at 37.5 mcg/day. Resume lisinopril, 2.5 mg/day.  Try not to over-treat low BGs. Apply ketoconazole 2% cream, to her feet once daily.  3. Patient education: We discussed the use of lisinopril for renal protection, but also the warning not to get pregnant while on lisinopril. We also discussed the long-term adverse effects of higher BGs. Traci Rivas knows what to do to control her BGs.. 4. Follow-up: 3 months  Level of Service: This visit lasted in excess of 60 minutes. More than 50% of the visit was devoted to counseling.  David StallBRENNAN,Niaya Hickok J

## 2014-11-30 NOTE — Patient Instructions (Signed)
Follow up visit in 3 months. Please have lab tests drawn one week prior to next visit.  

## 2014-12-04 ENCOUNTER — Telehealth: Payer: Self-pay | Admitting: *Deleted

## 2014-12-04 ENCOUNTER — Encounter: Payer: Self-pay | Admitting: Physician Assistant

## 2014-12-04 ENCOUNTER — Ambulatory Visit (HOSPITAL_BASED_OUTPATIENT_CLINIC_OR_DEPARTMENT_OTHER)
Admission: RE | Admit: 2014-12-04 | Discharge: 2014-12-04 | Disposition: A | Discharge status: Home / Self Care | Payer: BLUE CROSS/BLUE SHIELD | Source: Ambulatory Visit | Attending: Physician Assistant | Admitting: Physician Assistant

## 2014-12-04 ENCOUNTER — Ambulatory Visit (INDEPENDENT_AMBULATORY_CARE_PROVIDER_SITE_OTHER): Payer: BLUE CROSS/BLUE SHIELD | Admitting: Physician Assistant

## 2014-12-04 VITALS — BP 116/62 | HR 71 | Temp 98.1°F | Resp 16 | Ht 63.0 in | Wt 189.5 lb

## 2014-12-04 DIAGNOSIS — R1013 Epigastric pain: Secondary | ICD-10-CM

## 2014-12-04 DIAGNOSIS — R1084 Generalized abdominal pain: Secondary | ICD-10-CM

## 2014-12-04 DIAGNOSIS — R945 Abnormal results of liver function studies: Principal | ICD-10-CM

## 2014-12-04 DIAGNOSIS — R748 Abnormal levels of other serum enzymes: Secondary | ICD-10-CM

## 2014-12-04 DIAGNOSIS — K5900 Constipation, unspecified: Secondary | ICD-10-CM | POA: Diagnosis not present

## 2014-12-04 DIAGNOSIS — R109 Unspecified abdominal pain: Secondary | ICD-10-CM | POA: Diagnosis present

## 2014-12-04 DIAGNOSIS — R7989 Other specified abnormal findings of blood chemistry: Secondary | ICD-10-CM

## 2014-12-04 LAB — CBC WITH DIFFERENTIAL/PLATELET
Basophils Absolute: 0 10*3/uL (ref 0.0–0.1)
Basophils Relative: 0.7 % (ref 0.0–3.0)
EOS ABS: 0.1 10*3/uL (ref 0.0–0.7)
Eosinophils Relative: 2.9 % (ref 0.0–5.0)
HCT: 43.6 % (ref 36.0–46.0)
Hemoglobin: 15 g/dL (ref 12.0–15.0)
LYMPHS PCT: 36.2 % (ref 12.0–46.0)
Lymphs Abs: 1.4 10*3/uL (ref 0.7–4.0)
MCHC: 34.3 g/dL (ref 30.0–36.0)
MCV: 90.5 fl (ref 78.0–100.0)
Monocytes Absolute: 0.3 10*3/uL (ref 0.1–1.0)
Monocytes Relative: 8.6 % (ref 3.0–12.0)
Neutro Abs: 2 10*3/uL (ref 1.4–7.7)
Neutrophils Relative %: 51.6 % (ref 43.0–77.0)
Platelets: 291 10*3/uL (ref 150.0–400.0)
RBC: 4.82 Mil/uL (ref 3.87–5.11)
RDW: 12.3 % (ref 11.5–15.5)
WBC: 3.9 10*3/uL — AB (ref 4.0–10.5)

## 2014-12-04 LAB — COMPREHENSIVE METABOLIC PANEL
ALK PHOS: 139 U/L — AB (ref 39–117)
ALT: 569 U/L — AB (ref 0–35)
AST: 389 U/L — ABNORMAL HIGH (ref 0–37)
Albumin: 3.8 g/dL (ref 3.5–5.2)
BUN: 5 mg/dL — AB (ref 6–23)
CHLORIDE: 103 meq/L (ref 96–112)
CO2: 29 meq/L (ref 19–32)
Calcium: 9 mg/dL (ref 8.4–10.5)
Creatinine, Ser: 0.61 mg/dL (ref 0.40–1.20)
GFR: 128.38 mL/min (ref >60.00)
GLUCOSE: 190 mg/dL — AB (ref 70–99)
Potassium: 4.3 mEq/L (ref 3.5–5.1)
Sodium: 138 mEq/L (ref 135–145)
Total Bilirubin: 0.9 mg/dL (ref 0.2–1.2)
Total Protein: 6.9 g/dL (ref 6.0–8.3)

## 2014-12-04 LAB — LIPASE: Lipase: 7 U/L — ABNORMAL LOW (ref 11.0–59.0)

## 2014-12-04 NOTE — Telephone Encounter (Signed)
Notified pt and she voices understanding. Pt is agreeable to proceed with u/s and follow up appts but she is going out of town tomorrow and will not be back until Wednesday afternoon. Scheduled pt follow up with Elyn Aquas, Pa-c for 12/08/14 at 7am.  Notes Recorded by Brunetta Jeans, PA-C on 12/04/2014 at 2:56 PM Liver enzymes and Alk Phos (elevated) concerning for gallbladder or liver as cause of symptoms. I am glad she is asymptomatic -- want her to return in 48 hours for re-evaluation and repeat labs. If she had a contact with same symptoms it is possible that she obtained a viral Hepatitis A. Will check further labs at follow-up. I do want to go ahead and obtain an Ultrasound of her Gallbladder and liver to assess for any stones or abnormal findings. Is she ok with me setting this up?

## 2014-12-04 NOTE — Progress Notes (Signed)
Pre visit review using our clinic review tool, if applicable. No additional management support is needed unless otherwise documented below in the visit note/SLS  

## 2014-12-04 NOTE — Progress Notes (Signed)
Patient presents to clinic today c/o resolved episodes of loose stools and nausea over the past week that culminated in an episode of epigastric pain with non bloody emesis.  Patient is a diabetic and endorses glucose of 260 at the time of symptoms.  Patient states symptoms resolved on their own, but she wanted to get checked out.  Currently, patient without abdominal pain, nausea or vomiting.  Is eating a bland diet presently.  Denies fever, chills, malaise.  Blood sugars running up to 200 fasting presently but are improving.  Patient endorses good urinary output.  Endorses good flatulence but has not has a bowel movement in 48 hours.   Past Medical History  Diagnosis Date  . Diabetes mellitus     type 1  . Hypoglycemia associated with diabetes   . Dysmenorrhea   . Hypothyroidism, acquired, autoimmune   . Thyroiditis, autoimmune     Current Outpatient Prescriptions on File Prior to Visit  Medication Sig Dispense Refill  . glucagon 1 MG injection Follow package directions for low blood sugar. 2 each 4  . glucose blood (ONETOUCH VERIO) test strip Check blood sugar 10 x daily 900 each 4  . insulin aspart (NOVOLOG FLEXPEN) 100 UNIT/ML FlexPen Use up to 50 units daily 15 pen 6  . Insulin Glargine (LANTUS SOLOSTAR) 100 UNIT/ML Solostar Pen Use up to 50 units daily 15 pen 4  . ketoconazole (NIZORAL) 2 % cream Apply 1 application topically daily. 30 g 6  . Levonorgestrel-Ethinyl Estradiol (AMETHIA) 0.15-0.03 &0.01 MG tablet Take 1 tablet by mouth daily. 91 tablet 4  . lisinopril (PRINIVIL,ZESTRIL) 2.5 MG tablet Take 1 tablet (2.5 mg total) by mouth daily. 30 tablet 11  . SYNTHROID 25 MCG tablet Take 1.5 Synthroid 25 mcg tablets each morning. 45 tablet 6  . terconazole (TERAZOL 3) 0.8 % vaginal cream Place 1 applicator vaginally at bedtime. 20 g 0   No current facility-administered medications on file prior to visit.    Allergies  Allergen Reactions  . Amoxicillin   . Penicillins      Family History  Problem Relation Age of Onset  . Heart attack Paternal Grandfather   . Heart disease Paternal Grandfather   . Diabetes Sister   . Hypothyroidism Sister   . Cancer Paternal Grandmother     History   Social History  . Marital Status: Single    Spouse Name: N/A    Number of Children: N/A  . Years of Education: N/A   Social History Main Topics  . Smoking status: Never Smoker   . Smokeless tobacco: Never Used  . Alcohol Use: No  . Drug Use: No  . Sexual Activity: Not Currently    Birth Control/ Protection: Pill   Other Topics Concern  . None   Social History Narrative   Review of Systems - See HPI.  All other ROS are negative.  BP 116/62 mmHg  Pulse 71  Temp(Src) 98.1 F (36.7 C) (Oral)  Resp 16  Ht _0  (1.6 m)  Wt 189 lb 8 oz (85.957 kg)  BMI 33.58 kg/m2  SpO2 100%  LMP 11/03/2014  Physical Exam  Constitutional: She is oriented to person, place, and time and well-developed, well-nourished, and in no distress.  HENT:  Head: Normocephalic and atraumatic.  Right Ear: Tympanic membrane, external ear and ear canal normal.  Left Ear: Tympanic membrane, external ear and ear canal normal.  Nose: Nose normal. No mucosal edema.  Mouth/Throat: Uvula is midline, oropharynx is clear  and moist and mucous membranes are normal. No oropharyngeal exudate or posterior oropharyngeal erythema.  Eyes: Conjunctivae are normal. Pupils are equal, round, and reactive to light.  Neck: Neck supple. No thyromegaly present.  Cardiovascular: Normal rate, regular rhythm, normal heart sounds and intact distal pulses.   Pulmonary/Chest: Effort normal and breath sounds normal. No respiratory distress. She has no wheezes. She has no rales.  Abdominal: Soft. She exhibits no distension and no mass. There is no tenderness. There is no rebound and no guarding.  Present but slightly hypoactive bowel sounds.  Lymphadenopathy:    She has no cervical adenopathy.  Neurological: She  is alert and oriented to person, place, and time. No cranial nerve deficit.  Skin: Skin is warm and dry. No rash noted.  Psychiatric: Affect normal.  Vitals reviewed.   Recent Results (from the past 2160 hour(s))  Urinalysis with Culture Reflex     Status: Abnormal   Collection Time: 10/24/14  9:48 AM  Result Value Ref Range   Color, Urine YELLOW YELLOW   APPearance CLEAR CLEAR   Specific Gravity, Urine >1.030 (H) 1.005 - 1.030   pH 6.0 5.0 - 8.0   Glucose, UA > 1000 (A) NEG mg/dL   Bilirubin Urine NEG NEG   Ketones, ur NEG NEG mg/dL   Hgb urine dipstick NEG NEG   Protein, ur NEG NEG mg/dL   Urobilinogen, UA 0.2 0.0 - 1.0 mg/dL   Nitrite NEG NEG   Leukocytes, UA NEG NEG   Squamous Epithelial / LPF NONE SEEN RARE   Crystals Calcium Oxalate crystals noted NONE SEEN   Casts NONE SEEN NONE SEEN   WBC, UA 0-2 <3 WBC/hpf   RBC / HPF 0-2 <3 RBC/hpf   Bacteria, UA NONE SEEN RARE  CBC with Differential     Status: None   Collection Time: 10/24/14  9:48 AM  Result Value Ref Range   WBC 5.3 4.0 - 10.5 K/uL   RBC 4.71 3.87 - 5.11 MIL/uL   Hemoglobin 14.8 12.0 - 15.0 g/dL   HCT 43.7 36.0 - 46.0 %   MCV 92.8 78.0 - 100.0 fL   MCH 31.4 26.0 - 34.0 pg   MCHC 33.9 30.0 - 36.0 g/dL   RDW 12.5 11.5 - 15.5 %   Platelets 292 150 - 400 K/uL   MPV 9.9 8.6 - 12.4 fL    Comment: ** Please note change in reference range(s). **   Neutrophils Relative % 54 43 - 77 %   Neutro Abs 2.9 1.7 - 7.7 K/uL   Lymphocytes Relative 32 12 - 46 %   Lymphs Abs 1.7 0.7 - 4.0 K/uL   Monocytes Relative 11 3 - 12 %   Monocytes Absolute 0.6 0.1 - 1.0 K/uL   Eosinophils Relative 2 0 - 5 %   Eosinophils Absolute 0.1 0.0 - 0.7 K/uL   Basophils Relative 1 0 - 1 %   Basophils Absolute 0.1 0.0 - 0.1 K/uL   Smear Review Criteria for review not met   Comprehensive metabolic panel     Status: Abnormal   Collection Time: 10/24/14  9:48 AM  Result Value Ref Range   Sodium 136 135 - 145 mEq/L   Potassium 4.3 3.5 - 5.3  mEq/L   Chloride 99 96 - 112 mEq/L   CO2 28 19 - 32 mEq/L   Glucose, Bld 194 (H) 70 - 99 mg/dL   BUN 10 6 - 23 mg/dL   Creat 0.71 0.50 - 1.10  mg/dL   Total Bilirubin 0.7 0.2 - 1.2 mg/dL   Alkaline Phosphatase 108 39 - 117 U/L   AST 17 0 - 37 U/L   ALT 18 0 - 35 U/L   Total Protein 6.6 6.0 - 8.3 g/dL   Albumin 4.0 3.5 - 5.2 g/dL   Calcium 9.5 8.4 - 10.5 mg/dL  HgB A1c     Status: Abnormal   Collection Time: 10/24/14  9:48 AM  Result Value Ref Range   Hgb A1c MFr Bld 9.2 (H) <5.7 %    Comment:                                                                        According to the ADA Clinical Practice Recommendations for 2011, when HbA1c is used as a screening test:     >=6.5%   Diagnostic of Diabetes Mellitus            (if abnormal result is confirmed)   5.7-6.4%   Increased risk of developing Diabetes Mellitus   References:Diagnosis and Classification of Diabetes Mellitus,Diabetes UMPN,3614,43(XVQMG 1):S62-S69 and Standards of Medical Care in         Diabetes - 2011,Diabetes Care,2011,34 (Suppl 1):S11-S61.      Mean Plasma Glucose 217 (H) <117 mg/dL  TSH     Status: Abnormal   Collection Time: 10/24/14  9:48 AM  Result Value Ref Range   TSH 6.526 (H) 0.350 - 4.500 uIU/mL  WET PREP FOR TRICH, YEAST, CLUE     Status: Abnormal   Collection Time: 10/24/14  9:49 AM  Result Value Ref Range   Yeast Wet Prep HPF POC FEW (A) NONE SEEN   Trich, Wet Prep NONE SEEN NONE SEEN   Clue Cells Wet Prep HPF POC NONE SEEN NONE SEEN   WBC, Wet Prep HPF POC FEW NONE SEEN    Comment: FEW BACTERIA SEEN (3-6) EPITH. CELLS PER HPF AMINE NEGATIVE   POCT Glucose (CBG)     Status: Abnormal   Collection Time: 11/30/14  9:53 AM  Result Value Ref Range   POC Glucose 258 (A) 70 - 99 mg/dl    Comment: 930 apple  CBC w/Diff     Status: Abnormal   Collection Time: 12/04/14 10:40 AM  Result Value Ref Range   WBC 3.9 (L) 4.0 - 10.5 K/uL   RBC 4.82 3.87 - 5.11 Mil/uL   Hemoglobin 15.0 12.0 - 15.0  g/dL   HCT 43.6 36.0 - 46.0 %   MCV 90.5 78.0 - 100.0 fl   MCHC 34.3 30.0 - 36.0 g/dL   RDW 12.3 11.5 - 15.5 %   Platelets 291.0 150.0 - 400.0 K/uL   Neutrophils Relative % 51.6 43.0 - 77.0 %   Lymphocytes Relative 36.2 12.0 - 46.0 %   Monocytes Relative 8.6 3.0 - 12.0 %   Eosinophils Relative 2.9 0.0 - 5.0 %   Basophils Relative 0.7 0.0 - 3.0 %   Neutro Abs 2.0 1.4 - 7.7 K/uL   Lymphs Abs 1.4 0.7 - 4.0 K/uL   Monocytes Absolute 0.3 0.1 - 1.0 K/uL   Eosinophils Absolute 0.1 0.0 - 0.7 K/uL   Basophils Absolute 0.0 0.0 - 0.1 K/uL  Comp Met (CMET)  Status: Abnormal   Collection Time: 12/04/14 10:40 AM  Result Value Ref Range   Sodium 138 135 - 145 mEq/L   Potassium 4.3 3.5 - 5.1 mEq/L   Chloride 103 96 - 112 mEq/L   CO2 29 19 - 32 mEq/L   Glucose, Bld 190 (H) 70 - 99 mg/dL   BUN 5 (L) 6 - 23 mg/dL   Creatinine, Ser 0.61 0.40 - 1.20 mg/dL   Total Bilirubin 0.9 0.2 - 1.2 mg/dL   Alkaline Phosphatase 139 (H) 39 - 117 U/L   AST 389 (H) 0 - 37 U/L   ALT 569 (H) 0 - 35 U/L   Total Protein 6.9 6.0 - 8.3 g/dL   Albumin 3.8 3.5 - 5.2 g/dL   Calcium 9.0 8.4 - 10.5 mg/dL   GFR 128.38 >60.00 mL/min  Lipase     Status: Abnormal   Collection Time: 12/04/14 10:40 AM  Result Value Ref Range   Lipase 7.0 (L) 11.0 - 59.0 U/L    Assessment/Plan: Abdominal pain, epigastric One episode at the end of what seems to be a viral gastroenteritis although viral hepatitis or acute pancreatitis can not be ruled out.  One episode of non-bloody emesis.  All symptoms have resolved.  Encouraged increased fluids. Will check CBC, CMP, Lipase.  Will also check x-ray abdomen to r/o SBO and assess stool burden.  Further instructions will be given based on results.

## 2014-12-04 NOTE — Telephone Encounter (Signed)
Thank you for making me aware.  Will assess her at her visit.

## 2014-12-04 NOTE — Patient Instructions (Signed)
Please go to the lab for blood work. Then go downstairs for x-ray. I will call you with all of your results. Until you hear from me regarding your x-ray, stay well hydrated but limit solid foods.  I want to make sure that you are just empty and that there is not an obstruction causing the lack of bowel movement.  Your sugars should continue to decrease now that you are getting over a viral stomach bug.  If any symptoms recur or you develop severe vomiting, please go to the ER.

## 2014-12-04 NOTE — Assessment & Plan Note (Addendum)
One episode at the end of what seems to be a viral gastroenteritis although viral hepatitis or acute pancreatitis can not be ruled out.  One episode of non-bloody emesis.  All symptoms have resolved.  Encouraged increased fluids. Will check CBC, CMP, Lipase.  Will also check x-ray abdomen to r/o SBO and assess stool burden.  Further instructions will be given based on results.

## 2014-12-04 NOTE — Telephone Encounter (Signed)
Order placed.  Will be contacted to schedule.  Will see at follow-up on Friday.

## 2014-12-04 NOTE — Telephone Encounter (Signed)
Patient's mother called [facility] 12/03/14 at 2:11 am and spoke with Caprice RedSamantha Ruszkowski, RN for triage.    "Caller states her daughter is c/o severe middle back/stomach pain.  She is having intestinal problems for the past week. Affirmed Questions: high-risk adult, DM type 1 Disposition: Go to ED now"   Called patient to follow-up on abdominal symptoms.  Patient stated that she did not go to ED because she started feeling better.  She states that she vomited x 1 before triage call, but has had no vomiting since.  She is still nauseated and has been eating small amounts of bland foods.  She stated that the pain lasted around 30 minutes and was sharp.  It was relieved by sleep.  She has had no fevers, no bloating, and has not noticed any blood in stool/ emesis.  Patient would like to be seen today.    Appointment made with Marcelline MatesWilliam Martin, PA at 10:00.

## 2014-12-07 ENCOUNTER — Ambulatory Visit (HOSPITAL_BASED_OUTPATIENT_CLINIC_OR_DEPARTMENT_OTHER)
Admission: RE | Admit: 2014-12-07 | Discharge: 2014-12-07 | Disposition: A | Discharge status: Home / Self Care | Payer: BLUE CROSS/BLUE SHIELD | Source: Ambulatory Visit | Attending: Physician Assistant | Admitting: Physician Assistant

## 2014-12-07 DIAGNOSIS — R748 Abnormal levels of other serum enzymes: Secondary | ICD-10-CM

## 2014-12-07 DIAGNOSIS — R7989 Other specified abnormal findings of blood chemistry: Secondary | ICD-10-CM | POA: Insufficient documentation

## 2014-12-07 DIAGNOSIS — R945 Abnormal results of liver function studies: Secondary | ICD-10-CM

## 2014-12-07 DIAGNOSIS — R1084 Generalized abdominal pain: Secondary | ICD-10-CM

## 2014-12-07 LAB — COMPREHENSIVE METABOLIC PANEL
ALT: 183 U/L — ABNORMAL HIGH (ref 0–35)
AST: 41 U/L — ABNORMAL HIGH (ref 0–37)
Albumin: 3.8 g/dL (ref 3.5–5.2)
Alkaline Phosphatase: 119 U/L — ABNORMAL HIGH (ref 39–117)
BUN: 10 mg/dL (ref 6–23)
CALCIUM: 9.2 mg/dL (ref 8.4–10.5)
CHLORIDE: 102 meq/L (ref 96–112)
CO2: 28 mEq/L (ref 19–32)
Creat: 0.67 mg/dL (ref 0.50–1.10)
GLUCOSE: 194 mg/dL — AB (ref 70–99)
Potassium: 4.4 mEq/L (ref 3.5–5.3)
Sodium: 139 mEq/L (ref 135–145)
Total Bilirubin: 0.6 mg/dL (ref 0.2–1.2)
Total Protein: 6.7 g/dL (ref 6.0–8.3)

## 2014-12-07 LAB — LIPID PANEL
CHOLESTEROL: 145 mg/dL (ref 0–200)
HDL: 48 mg/dL (ref >39)
LDL Cholesterol: 83 mg/dL (ref 0–99)
Total CHOL/HDL Ratio: 3 Ratio
Triglycerides: 69 mg/dL (ref <150)
VLDL: 14 mg/dL (ref 0–40)

## 2014-12-07 LAB — T4, FREE: FREE T4: 1.17 ng/dL (ref 0.80–1.80)

## 2014-12-07 LAB — C-PEPTIDE: C-Peptide: 0.21 ng/mL — ABNORMAL LOW (ref 0.80–3.90)

## 2014-12-07 LAB — T3, FREE: T3, Free: 3.2 pg/mL (ref 2.3–4.2)

## 2014-12-07 LAB — TSH: TSH: 1.999 u[IU]/mL (ref 0.350–4.500)

## 2014-12-08 ENCOUNTER — Encounter: Payer: Self-pay | Admitting: Physician Assistant

## 2014-12-08 ENCOUNTER — Ambulatory Visit (INDEPENDENT_AMBULATORY_CARE_PROVIDER_SITE_OTHER): Payer: BLUE CROSS/BLUE SHIELD | Admitting: Physician Assistant

## 2014-12-08 VITALS — BP 105/61 | HR 82 | Temp 98.3°F | Resp 16 | Ht 63.0 in | Wt 190.5 lb

## 2014-12-08 DIAGNOSIS — K760 Fatty (change of) liver, not elsewhere classified: Secondary | ICD-10-CM

## 2014-12-08 DIAGNOSIS — R748 Abnormal levels of other serum enzymes: Secondary | ICD-10-CM | POA: Insufficient documentation

## 2014-12-08 LAB — ACUTE HEP PANEL AND HEP B SURFACE AB
HCV AB: NEGATIVE
HEP B C IGM: NONREACTIVE
HEP B S AG: NEGATIVE
Hep A IgM: NONREACTIVE
Hep B S Ab: POSITIVE — AB

## 2014-12-08 LAB — COMPREHENSIVE METABOLIC PANEL
ALT: 146 U/L — ABNORMAL HIGH (ref 0–35)
AST: 33 U/L (ref 0–37)
Albumin: 3.8 g/dL (ref 3.5–5.2)
Alkaline Phosphatase: 132 U/L — ABNORMAL HIGH (ref 39–117)
BILIRUBIN TOTAL: 0.4 mg/dL (ref 0.2–1.2)
BUN: 9 mg/dL (ref 6–23)
CALCIUM: 9.1 mg/dL (ref 8.4–10.5)
CHLORIDE: 102 meq/L (ref 96–112)
CO2: 29 meq/L (ref 19–32)
CREATININE: 0.69 mg/dL (ref 0.40–1.20)
GFR: 111.35 mL/min (ref >60.00)
GLUCOSE: 253 mg/dL — AB (ref 70–99)
Potassium: 4 mEq/L (ref 3.5–5.1)
Sodium: 137 mEq/L (ref 135–145)
TOTAL PROTEIN: 6.9 g/dL (ref 6.0–8.3)

## 2014-12-08 NOTE — Progress Notes (Signed)
Pre visit review using our clinic review tool, if applicable. No additional management support is needed unless otherwise documented below in the visit note/SLS  

## 2014-12-08 NOTE — Progress Notes (Signed)
Patient presents to clinic today for follow-up of elevated liver enzymes and [place] results.  Patient completely asymptomatic at present. Denies abdominal pain, fever, chills, nausea/vomiting, light stools, etc. Is staying well hydrated and well nourished. Her liver enzymes checked a couple of days ago show significant elevation in AST/ALT and some elevation in Alk Phos.  [place] negative for cholecystitis or choledocolithiasis.  Does show fatty liver.  Patient is non-alcoholic.  Has history of DM I.  Past Medical History  Diagnosis Date  . Diabetes mellitus     type 1  . Hypoglycemia associated with diabetes   . Dysmenorrhea   . Hypothyroidism, acquired, autoimmune   . Thyroiditis, autoimmune     Current Outpatient Prescriptions on File Prior to Visit  Medication Sig Dispense Refill  . glucagon 1 MG injection Follow package directions for low blood sugar. 2 each 4  . glucose blood (ONETOUCH VERIO) test strip Check blood sugar 10 x daily 900 each 4  . insulin aspart (NOVOLOG FLEXPEN) 100 UNIT/ML FlexPen Use up to 50 units daily 15 pen 6  . Insulin Glargine (LANTUS SOLOSTAR) 100 UNIT/ML Solostar Pen Use up to 50 units daily 15 pen 4  . ketoconazole (NIZORAL) 2 % cream Apply 1 application topically daily. 30 g 6  . Levonorgestrel-Ethinyl Estradiol (AMETHIA) 0.15-0.03 &0.01 MG tablet Take 1 tablet by mouth daily. 91 tablet 4  . lisinopril (PRINIVIL,ZESTRIL) 2.5 MG tablet Take 1 tablet (2.5 mg total) by mouth daily. 30 tablet 11  . SYNTHROID 25 MCG tablet Take 1.5 Synthroid 25 mcg tablets each morning. 45 tablet 6  . terconazole (TERAZOL 3) 0.8 % vaginal cream Place 1 applicator vaginally at bedtime. 20 g 0   No current facility-administered medications on file prior to visit.    Allergies  Allergen Reactions  . Amoxicillin   . Penicillins     Family History  Problem Relation Age of Onset  . Heart attack Paternal Grandfather   . Heart disease Paternal Grandfather   . Diabetes Sister   .  Hypothyroidism Sister   . Cancer Paternal Grandmother     History   Social History  . Marital Status: Single    Spouse Name: N/A  . Number of Children: N/A  . Years of Education: N/A   Social History Main Topics  . Smoking status: Never Smoker   . Smokeless tobacco: Never Used  . Alcohol Use: No  . Drug Use: No  . Sexual Activity: Not Currently    Birth Control/ Protection: Pill   Other Topics Concern  . None   Social History Narrative    Review of Systems - See HPI.  All other ROS are negative.  BP 105/61 mmHg  Pulse 82  Temp(Src) 98.3 F (36.8 C) (Oral)  Resp 16  Ht '5\' 3"'  (1.6 m)  Wt 190 lb 8 oz (86.41 kg)  BMI 33.75 kg/m2  SpO2 100%  LMP 12/05/2014  Physical Exam  Constitutional: She is oriented to person, place, and time and well-developed, well-nourished, and in no distress.  HENT:  Head: Normocephalic and atraumatic.  Eyes: Conjunctivae are normal.  Neck: Neck supple. No thyromegaly present.  Cardiovascular: Normal rate, regular rhythm, normal heart sounds and intact distal pulses.   Pulmonary/Chest: Effort normal and breath sounds normal. No respiratory distress. She has no wheezes. She has no rales. She exhibits no tenderness.  Abdominal: Soft. Bowel sounds are normal. She exhibits no distension and no mass. There is no tenderness. There is no rebound and  no guarding.  Lymphadenopathy:    She has no cervical adenopathy.  Neurological: She is alert and oriented to person, place, and time.  Skin: Skin is warm and dry.  Psychiatric: Affect normal.  Vitals reviewed.  Recent Results (from the past 2160 hour(s))  Urinalysis with Culture Reflex     Status: Abnormal   Collection Time: 10/24/14  9:48 AM  Result Value Ref Range   Color, Urine YELLOW YELLOW   APPearance CLEAR CLEAR   Specific Gravity, Urine >1.030 (H) 1.005 - 1.030   pH 6.0 5.0 - 8.0   Glucose, UA > 1000 (A) NEG mg/dL   Bilirubin Urine NEG NEG   Ketones, ur NEG NEG mg/dL   Hgb urine  dipstick NEG NEG   Protein, ur NEG NEG mg/dL   Urobilinogen, UA 0.2 0.0 - 1.0 mg/dL   Nitrite NEG NEG   Leukocytes, UA NEG NEG   Squamous Epithelial / LPF NONE SEEN RARE   Crystals Calcium Oxalate crystals noted NONE SEEN   Casts NONE SEEN NONE SEEN   WBC, UA 0-2 <3 WBC/hpf   RBC / HPF 0-2 <3 RBC/hpf   Bacteria, UA NONE SEEN RARE  CBC with Differential     Status: None   Collection Time: 10/24/14  9:48 AM  Result Value Ref Range   WBC 5.3 4.0 - 10.5 K/uL   RBC 4.71 3.87 - 5.11 MIL/uL   Hemoglobin 14.8 12.0 - 15.0 g/dL   HCT 43.7 36.0 - 46.0 %   MCV 92.8 78.0 - 100.0 fL   MCH 31.4 26.0 - 34.0 pg   MCHC 33.9 30.0 - 36.0 g/dL   RDW 12.5 11.5 - 15.5 %   Platelets 292 150 - 400 K/uL   MPV 9.9 8.6 - 12.4 fL    Comment: ** Please note change in reference range(s). **   Neutrophils Relative % 54 43 - 77 %   Neutro Abs 2.9 1.7 - 7.7 K/uL   Lymphocytes Relative 32 12 - 46 %   Lymphs Abs 1.7 0.7 - 4.0 K/uL   Monocytes Relative 11 3 - 12 %   Monocytes Absolute 0.6 0.1 - 1.0 K/uL   Eosinophils Relative 2 0 - 5 %   Eosinophils Absolute 0.1 0.0 - 0.7 K/uL   Basophils Relative 1 0 - 1 %   Basophils Absolute 0.1 0.0 - 0.1 K/uL   Smear Review Criteria for review not met   Comprehensive metabolic panel     Status: Abnormal   Collection Time: 10/24/14  9:48 AM  Result Value Ref Range   Sodium 136 135 - 145 mEq/L   Potassium 4.3 3.5 - 5.3 mEq/L   Chloride 99 96 - 112 mEq/L   CO2 28 19 - 32 mEq/L   Glucose, Bld 194 (H) 70 - 99 mg/dL   BUN 10 6 - 23 mg/dL   Creat 0.71 0.50 - 1.10 mg/dL   Total Bilirubin 0.7 0.2 - 1.2 mg/dL   Alkaline Phosphatase 108 39 - 117 U/L   AST 17 0 - 37 U/L   ALT 18 0 - 35 U/L   Total Protein 6.6 6.0 - 8.3 g/dL   Albumin 4.0 3.5 - 5.2 g/dL   Calcium 9.5 8.4 - 10.5 mg/dL  HgB A1c     Status: Abnormal   Collection Time: 10/24/14  9:48 AM  Result Value Ref Range   Hgb A1c MFr Bld 9.2 (H) <5.7 %    Comment:  According to the ADA Clinical Practice Recommendations for 2011, when HbA1c is used as a screening test:     >=6.5%   Diagnostic of Diabetes Mellitus            (if abnormal result is confirmed)   5.7-6.4%   Increased risk of developing Diabetes Mellitus   References:Diagnosis and Classification of Diabetes Mellitus,Diabetes ENMM,7680,88(PJSRP 1):S62-S69 and Standards of Medical Care in         Diabetes - 2011,Diabetes RXYV,8592,92 (Suppl 1):S11-S61.      Mean Plasma Glucose 217 (H) <117 mg/dL  TSH     Status: Abnormal   Collection Time: 10/24/14  9:48 AM  Result Value Ref Range   TSH 6.526 (H) 0.350 - 4.500 uIU/mL  WET PREP FOR TRICH, YEAST, CLUE     Status: Abnormal   Collection Time: 10/24/14  9:49 AM  Result Value Ref Range   Yeast Wet Prep HPF POC FEW (A) NONE SEEN   Trich, Wet Prep NONE SEEN NONE SEEN   Clue Cells Wet Prep HPF POC NONE SEEN NONE SEEN   WBC, Wet Prep HPF POC FEW NONE SEEN    Comment: FEW BACTERIA SEEN (3-6) EPITH. CELLS PER HPF AMINE NEGATIVE   POCT Glucose (CBG)     Status: Abnormal   Collection Time: 11/30/14  9:53 AM  Result Value Ref Range   POC Glucose 258 (A) 70 - 99 mg/dl    Comment: 930 apple  CBC w/Diff     Status: Abnormal   Collection Time: 12/04/14 10:40 AM  Result Value Ref Range   WBC 3.9 (L) 4.0 - 10.5 K/uL   RBC 4.82 3.87 - 5.11 Mil/uL   Hemoglobin 15.0 12.0 - 15.0 g/dL   HCT 43.6 36.0 - 46.0 %   MCV 90.5 78.0 - 100.0 fl   MCHC 34.3 30.0 - 36.0 g/dL   RDW 12.3 11.5 - 15.5 %   Platelets 291.0 150.0 - 400.0 K/uL   Neutrophils Relative % 51.6 43.0 - 77.0 %   Lymphocytes Relative 36.2 12.0 - 46.0 %   Monocytes Relative 8.6 3.0 - 12.0 %   Eosinophils Relative 2.9 0.0 - 5.0 %   Basophils Relative 0.7 0.0 - 3.0 %   Neutro Abs 2.0 1.4 - 7.7 K/uL   Lymphs Abs 1.4 0.7 - 4.0 K/uL   Monocytes Absolute 0.3 0.1 - 1.0 K/uL   Eosinophils Absolute 0.1 0.0 - 0.7 K/uL   Basophils Absolute 0.0 0.0 - 0.1 K/uL  Comp Met (CMET)      Status: Abnormal   Collection Time: 12/04/14 10:40 AM  Result Value Ref Range   Sodium 138 135 - 145 mEq/L   Potassium 4.3 3.5 - 5.1 mEq/L   Chloride 103 96 - 112 mEq/L   CO2 29 19 - 32 mEq/L   Glucose, Bld 190 (H) 70 - 99 mg/dL   BUN 5 (L) 6 - 23 mg/dL   Creatinine, Ser 0.61 0.40 - 1.20 mg/dL   Total Bilirubin 0.9 0.2 - 1.2 mg/dL   Alkaline Phosphatase 139 (H) 39 - 117 U/L   AST 389 (H) 0 - 37 U/L   ALT 569 (H) 0 - 35 U/L   Total Protein 6.9 6.0 - 8.3 g/dL   Albumin 3.8 3.5 - 5.2 g/dL   Calcium 9.0 8.4 - 10.5 mg/dL   GFR 128.38 >60.00 mL/min  Lipase     Status: Abnormal   Collection Time: 12/04/14 10:40 AM  Result Value Ref Range   Lipase  7.0 (L) 11.0 - 59.0 U/L  Comprehensive metabolic panel     Status: Abnormal   Collection Time: 12/07/14  9:56 AM  Result Value Ref Range   Sodium 139 135 - 145 mEq/L   Potassium 4.4 3.5 - 5.3 mEq/L   Chloride 102 96 - 112 mEq/L   CO2 28 19 - 32 mEq/L   Glucose, Bld 194 (H) 70 - 99 mg/dL   BUN 10 6 - 23 mg/dL   Creat 0.67 0.50 - 1.10 mg/dL   Total Bilirubin 0.6 0.2 - 1.2 mg/dL   Alkaline Phosphatase 119 (H) 39 - 117 U/L   AST 41 (H) 0 - 37 U/L   ALT 183 (H) 0 - 35 U/L   Total Protein 6.7 6.0 - 8.3 g/dL   Albumin 3.8 3.5 - 5.2 g/dL   Calcium 9.2 8.4 - 10.5 mg/dL  T3, free     Status: None   Collection Time: 12/07/14  9:56 AM  Result Value Ref Range   T3, Free 3.2 2.3 - 4.2 pg/mL  T4, free     Status: None   Collection Time: 12/07/14  9:56 AM  Result Value Ref Range   Free T4 1.17 0.80 - 1.80 ng/dL  TSH     Status: None   Collection Time: 12/07/14  9:56 AM  Result Value Ref Range   TSH 1.999 0.350 - 4.500 uIU/mL  C-peptide     Status: Abnormal   Collection Time: 12/07/14  9:56 AM  Result Value Ref Range   C-Peptide 0.21 (L) 0.80 - 3.90 ng/mL  Lipid panel     Status: None   Collection Time: 12/07/14  9:56 AM  Result Value Ref Range   Cholesterol 145 0 - 200 mg/dL    Comment: ATP III Classification:       < 200        mg/dL         Desirable      200 - 239     mg/dL        Borderline High      >= 240        mg/dL        High      Triglycerides 69 <150 mg/dL   HDL 48 >39 mg/dL   Total CHOL/HDL Ratio 3.0 Ratio   VLDL 14 0 - 40 mg/dL   LDL Cholesterol 83 0 - 99 mg/dL    Comment:   Total Cholesterol/HDL Ratio:CHD Risk                        Coronary Heart Disease Risk Table                                        Men       Women          1/2 Average Risk              3.4        3.3              Average Risk              5.0        4.4           2X Average Risk              9.6  7.1           3X Average Risk             23.4       11.0 Use the calculated Patient Ratio above and the CHD Risk table  to determine the patient's CHD Risk. ATP III Classification (LDL):       < 100        mg/dL         Optimal      100 - 129     mg/dL         Near or Above Optimal      130 - 159     mg/dL         Borderline High      160 - 189     mg/dL         High       > 190        mg/dL         Very High     Comp Met (CMET)     Status: Abnormal   Collection Time: 12/08/14  7:45 AM  Result Value Ref Range   Sodium 137 135 - 145 mEq/L   Potassium 4.0 3.5 - 5.1 mEq/L   Chloride 102 96 - 112 mEq/L   CO2 29 19 - 32 mEq/L   Glucose, Bld 253 (H) 70 - 99 mg/dL   BUN 9 6 - 23 mg/dL   Creatinine, Ser 0.69 0.40 - 1.20 mg/dL   Total Bilirubin 0.4 0.2 - 1.2 mg/dL   Alkaline Phosphatase 132 (H) 39 - 117 U/L   AST 33 0 - 37 U/L   ALT 146 (H) 0 - 35 U/L   Total Protein 6.9 6.0 - 8.3 g/dL   Albumin 3.8 3.5 - 5.2 g/dL   Calcium 9.1 8.4 - 10.5 mg/dL   GFR 111.35 >60.00 mL/min    Assessment/Plan: NAFLD (nonalcoholic fatty liver disease) Discussed cause and prognosis of illness.  Lipids within normal limits.  Discussed dietary and exercise efforts to help with this.  Continue diabetes medications as directed.  Will routinely monitor.   Elevated liver enzymes Asymptomatic.  Concern for acute hepatitis.  [place] only with NAFLD  noted. Will obtain repeat CMP and Acute hepatitis panel.

## 2014-12-08 NOTE — Assessment & Plan Note (Signed)
Asymptomatic.  Concern for acute hepatitis.  [place] only with NAFLD noted. Will obtain repeat CMP and Acute hepatitis panel.

## 2014-12-08 NOTE — Patient Instructions (Signed)
Please stop by the lab for blood work.  I will call you with your results. Your liver enzymes are headed back in the right direction, but we want to make sure they get back to normal. Stay hydrated.  Read information below on fatty liver disease.  Fatty Liver Fatty liver is the accumulation of fat in liver cells. It is also called hepatosteatosis or steatohepatitis. It is normal for your liver to contain some fat. If fat is more than 5 to 10% of your liver's weight, you have fatty liver.  There are often no symptoms (problems) for years while damage is still occurring. People often learn about their fatty liver when they have medical tests for other reasons. Fat can damage your liver for years or even decades without causing problems. When it becomes severe, it can cause fatigue, weight loss, weakness, and confusion. This makes you more likely to develop more serious liver problems. The liver is the largest organ in the body. It does a lot of work and often gives no warning signs when it is sick until late in a disease. The liver has many important jobs including:  Breaking down foods.  Storing vitamins, iron, and other minerals.  Making proteins.  Making bile for food digestion.  Breaking down many products including medications, alcohol and some poisons. CAUSES  There are a number of different conditions, medications, and poisons that can cause a fatty liver. Eating too many calories causes fat to build up in the liver. Not processing and breaking fats down normally may also cause this. Certain conditions, such as obesity, diabetes, and high triglycerides also cause this. Most fatty liver patients tend to be middle-aged and over weight.  Some causes of fatty liver are:  Alcohol over consumption.  Malnutrition.  Steroid use.  Valproic acid toxicity.  Obesity.  Cushing's syndrome.  Poisons.  Tetracycline in high dosages.  Pregnancy.  Diabetes.  Hyperlipidemia.  Rapid  weight loss. Some people develop fatty liver even having none of these conditions. SYMPTOMS  Fatty liver most often causes no problems. This is called asymptomatic.  It can be diagnosed with blood tests and also by a liver biopsy.  It is one of the most common causes of minor elevations of liver enzymes on routine blood tests.  Specialized Imaging of the liver using ultrasound, CT (computed tomography) scan, or MRI (magnetic resonance imaging) can suggest a fatty liver but a biopsy is needed to confirm it.  A biopsy involves taking a small sample of liver tissue. This is done by using a needle. It is then looked at under a microscope by a specialist. TREATMENT  It is important to treat the cause. Simple fatty liver without a medical reason may not need treatment.  Weight loss, fat restriction, and exercise in overweight patients produces inconsistent results but is worth trying.  Fatty liver due to alcohol toxicity may not improve even with stopping drinking.  Good control of diabetes may reduce fatty liver.  Lower your triglycerides through diet, medication or both.  Eat a balanced, healthy diet.  Increase your physical activity.  Get regular checkups from a liver specialist.  There are no medical or surgical treatments for a fatty liver or NASH, but improving your diet and increasing your exercise may help prevent or reverse some of the damage. PROGNOSIS  Fatty liver may cause no damage or it can lead to an inflammation of the liver. This is, called steatohepatitis. When it is linked to alcohol abuse, it is  called alcoholic steatohepatitis. It often is not linked to alcohol. It is then called nonalcoholic steatohepatitis, or NASH. Over time the liver may become scarred and hardened. This condition is called cirrhosis. Cirrhosis is serious and may lead to liver failure or cancer. NASH is one of the leading causes of cirrhosis. About 10-20% of Americans have fatty liver and a smaller  2-5% has NASH. Document Released: 11/28/2005 Document Revised: 01/05/2012 Document Reviewed: 02/22/2014 [facility] CenterExit[facility] Patient Information 2015 [employer], [place]. This information is not intended to replace advice given to you by your health care provider. Make sure you discuss any questions you have with your health care provider.

## 2014-12-08 NOTE — Assessment & Plan Note (Signed)
Discussed cause and prognosis of illness.  Lipids within normal limits.  Discussed dietary and exercise efforts to help with this.  Continue diabetes medications as directed.  Will routinely monitor.

## 2014-12-12 ENCOUNTER — Encounter: Payer: Self-pay | Admitting: *Deleted

## 2015-01-02 LAB — HM DIABETES EYE EXAM

## 2015-01-10 ENCOUNTER — Other Ambulatory Visit: Payer: Self-pay | Admitting: "Endocrinology

## 2015-01-12 ENCOUNTER — Encounter: Payer: Self-pay | Admitting: "Endocrinology

## 2015-01-31 ENCOUNTER — Encounter: Payer: Self-pay | Admitting: Family Medicine

## 2015-02-21 ENCOUNTER — Other Ambulatory Visit: Payer: Self-pay | Admitting: *Deleted

## 2015-02-21 DIAGNOSIS — E034 Atrophy of thyroid (acquired): Secondary | ICD-10-CM

## 2015-02-28 ENCOUNTER — Ambulatory Visit: Payer: BLUE CROSS/BLUE SHIELD | Admitting: "Endocrinology

## 2015-05-11 ENCOUNTER — Other Ambulatory Visit: Payer: Self-pay | Admitting: "Endocrinology

## 2015-08-27 ENCOUNTER — Other Ambulatory Visit: Payer: Self-pay | Admitting: *Deleted

## 2015-08-27 ENCOUNTER — Telehealth: Payer: Self-pay | Admitting: "Endocrinology

## 2015-08-27 DIAGNOSIS — E109 Type 1 diabetes mellitus without complications: Secondary | ICD-10-CM

## 2015-08-27 NOTE — Telephone Encounter (Signed)
Labs placed in portal. 

## 2015-10-03 ENCOUNTER — Ambulatory Visit: Payer: BLUE CROSS/BLUE SHIELD | Admitting: "Endocrinology

## 2015-10-06 ENCOUNTER — Other Ambulatory Visit: Payer: Self-pay | Admitting: "Endocrinology

## 2015-10-11 ENCOUNTER — Encounter: Payer: Self-pay | Admitting: "Endocrinology

## 2015-10-11 ENCOUNTER — Encounter (INDEPENDENT_AMBULATORY_CARE_PROVIDER_SITE_OTHER): Payer: Self-pay

## 2015-10-11 ENCOUNTER — Ambulatory Visit (INDEPENDENT_AMBULATORY_CARE_PROVIDER_SITE_OTHER): Payer: BLUE CROSS/BLUE SHIELD | Admitting: "Endocrinology

## 2015-10-11 VITALS — BP 119/86 | HR 83 | Wt 188.0 lb

## 2015-10-11 DIAGNOSIS — E038 Other specified hypothyroidism: Secondary | ICD-10-CM

## 2015-10-11 DIAGNOSIS — E663 Overweight: Secondary | ICD-10-CM

## 2015-10-11 DIAGNOSIS — E049 Nontoxic goiter, unspecified: Secondary | ICD-10-CM

## 2015-10-11 DIAGNOSIS — E109 Type 1 diabetes mellitus without complications: Secondary | ICD-10-CM

## 2015-10-11 DIAGNOSIS — Z23 Encounter for immunization: Secondary | ICD-10-CM | POA: Diagnosis not present

## 2015-10-11 DIAGNOSIS — E1065 Type 1 diabetes mellitus with hyperglycemia: Principal | ICD-10-CM

## 2015-10-11 DIAGNOSIS — E063 Autoimmune thyroiditis: Secondary | ICD-10-CM

## 2015-10-11 DIAGNOSIS — B353 Tinea pedis: Secondary | ICD-10-CM

## 2015-10-11 DIAGNOSIS — IMO0001 Reserved for inherently not codable concepts without codable children: Secondary | ICD-10-CM

## 2015-10-11 DIAGNOSIS — I10 Essential (primary) hypertension: Secondary | ICD-10-CM | POA: Diagnosis not present

## 2015-10-11 DIAGNOSIS — E10649 Type 1 diabetes mellitus with hypoglycemia without coma: Secondary | ICD-10-CM | POA: Diagnosis not present

## 2015-10-11 LAB — POCT GLYCOSYLATED HEMOGLOBIN (HGB A1C): HEMOGLOBIN A1C: 9

## 2015-10-11 LAB — GLUCOSE, POCT (MANUAL RESULT ENTRY): POC GLUCOSE: 203 mg/dL — AB (ref 70–99)

## 2015-10-11 NOTE — Patient Instructions (Signed)
Follow up visit in 3 months. Call Dr. Fransico Styles Fambro in 4 weeks on a Wednesday or Sunday evening between 8:00-9;30 PM to discuss BGs.

## 2015-10-11 NOTE — Progress Notes (Signed)
CC: FU T1DM, hypoglycemia, overweight, hypothyroid, thyroiditis, and goiter  HPI: Traci Rivas is a 24 y.o. Caucasian young woman. She was unaccompanied today.  1. The patient developed polyuria, polydipsia, excessive thirst, and a yeast vaginitis that was difficult to treat in February and March 2011. Because her sister had T1DM and the mother recognized the signs and symptoms of DM, the mother did a CBG at home. That value was 592. Mother took the young woman to the ED where her glucose was 507. She was given a dose of insulin and sent home, with instructions to call her doctor. Instead, mother called me and I saw the patient the next day, 01/04/10. She looked tired, was dehydrated, and had a 20-25 gram goiter. Her HbA1c was 9.3%.  I put her on our standard multiple daily injection of insulin (MDI) regimen with Lantus as a basal insulin and Novolog as a mealtime insulin. For details of this plan, please see my office visit note from 04/29/11.  2. During the past 5 years Traci Rivas has done fairly well overall. She was lost to follow up when she moved out to [place] in 2015, but she returned to [facility] in December 2015.   3. Traci Rivas's last [facility] visit was on 11/30/14. She has been healthy. Her weight is unchanged. She increased her Lantus dose to 40 units at bedtime. Her Novolog plan remains the 150/50/15 plan, but she often gives additional Novolog for certain foods, such as cereals. She continues on Synthroid, 37.5 mcg/day. She has not been taking lisinopril due to having normal BPs at other clinics.  She stopped taking OCPs. She has not been using ketoconazole cream on her feet. She has been very busy and has not been doing much physical activity, but wants to do more in the coming year.   3. Pertinent Review of Systems: Constitutional: The patient feels "good but tired", is healthy, and has no significant complaints. Eyes: Vision is good. There are no significant eye complaints. Her last eye exam was in February or  March 2016. There were no signs of DM eye damage. She will have a follow up exam in the next 2-3 months.   Neck: The patient has no complaints of anterior neck swelling, soreness, tenderness,  pressure, discomfort, or difficulty swallowing.  Heart: Heart rate increases with exercise or other physical activity. The patient has no complaints of palpitations, irregular heat beats, chest pain, or chest pressure. Gastrointestinal: Bowel movents seem normal. The patient has no complaints of excessive hunger, acid reflux, upset stomach, stomach aches or pains, diarrhea, or constipation. Legs: Muscle mass and strength seem normal. There are no complaints of numbness, tingling, burning, or pain. No edema is noted. Feet: There are no obvious foot problems, except for some worsening of her tinea pedis. There are no complaints of numbness, tingling, burning, or pain. No edema is noted. Hypoglycemia: She has had some nocturnal hypoglycemia, especially if she does not follow the Small bedtime snack plan. Marland Kitchen  GYN: LMP was 2-3 weeks ago. Periods have been regular. She is not on birth control now. She has been followed by OB-GYN for birth control and for her previous atypical pap smear.   4. BG printout: Traci Rivas is checking BGs 0-4 times daily, mostly 2 times per day. She went for 4 days without any BG checks at all. She has had 7 nocturnal low BGs, six of which were due to not checking BGs at bedtime and not taking the appropriate snack. Her BGs are still quite variable,  ranging from 51-547. When she does not check BGs, she will give a Food Dose of Novolog and sometimes give more insulin on a guesstimate basis.    PAST MEDICAL, FAMILY, AND SOCIAL HISTORY: 1. 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 She is still interested in going into the ministry or into counseling. She is still covered by her parents' insurance. 2. Activities: She resumed exercising last  week.  3. Primary care provider: Dr. Loreen Freud, [facility]   REVIEW OF SYSTEMS: There are no other significant problems involving her other body systems.  PHYSICAL EXAM: BP 119/86 mmHg  Pulse 83  Wt 188 lb (85.276 kg)  Constitutional: The patient looks healthy, but obese. She has not had any weight change.    Eyes: There is no arcus or proptosis.  Mouth: The oropharynx appears normal. The tongue appears normal. There is normal oral moisture. There is no obvious gingivitis. Neck: There are no bruits present. The thyroid gland appears enlarged. Both lobes are fairly symmetrically enlarged. The thyroid gland is larger at about 22 grams in size. The consistency of the thyroid gland is normal.  There was no tenderness to palpation of the thyroid gland today.  Lungs: The lungs are clear. Air movement is good. Heart: The heart rhythm and rate appear normal. Heart sounds S1 and S2 are normal. I do not appreciate any pathologic heart murmurs. Abdomen: The abdomen is enlarged. Bowel sounds are normal. The abdomen is soft and non-tender. There is no obviously palpable hepatomegaly, splenomegaly, or other masses.  Arms: Muscle mass appears appropriate for age.  Hands: There is no obvious tremor. Phalangeal and metacarpophalangeal joints appear normal. Palms are normal. Legs: Muscle mass appears appropriate for age. There is no edema.  Feet: There are no significant deformities. Dorsalis pedis pulses are normal 1+ bilaterally. She also has 2+ tinea pedis of her heels and 1+ calluses. Neurologic: Muscle strength is normal for age and gender in both the upper and the lower extremities. Muscle tone appears normal. Sensation to touch is normal in the legs and feet.  Labs:   Labs 10/11/15: HbA1c 9.0%.   Labs 12/07/14: CMP was normal, except for glucose of 194, AST of 41, and ALT of 183 ( improved after labs on 12/04/14 that were associated with an acute gastroenteritis). TSH  1.999, free T4 1.17, free T3 3.2;  C-peptide o.21 (normal 0.80-3.90); cholesterol 145, triglycerides 69, HDL 48, LDL 112  Labs 11/30/14: HbA1c 9.2%  10/24/14: HbA1c 9.2%, compared with 8.8% at last visit and with 7.9% at the visit prior. CMP normal, except glucose 194 - AST 17 and ALT 18  10/18/13: PAP smear: Atypical squamous cells of undetermined significance - I suggested she contact her GYN to discuss this result.   10/13/13: TSH 3.138, free T4 1.04, free T3 2.9  05/02/13: Cholesterol 187, triglycerides 87, HDL 58, LDL 112; CMP normal, except glucose 174; urinary microalbumin/creatinine ratio was 2.5.; TSH 3.942, free T4 1.11, free T3 3.5  04/27/12. TSH was 1.620. Free T4 was 1.02. Free T3 was 2.8. This was on her Synthroid dose of 25 mcg per day.   ASSESSMENT: 1. T1DM: Overall her BG control is about the same. She needs to really take charge of her lifestyle and her T1DM. She has not been as adherent to her DM care plan as she knows that she should have been.  2. Hypoglycemia: She is having more  hypoglycemia, but all in the mornings, mostly due to not checking  BGs at bedtime and taking the appropriate bedtime snacks.  3. Hypothyroid: She was euthyroid in February 2016.     4. Goiter: The thyroid gland is larger. The process of waxing and waning of thyroid gland size is c/w evolving Hashimoto's thyroiditis.  5. Thyroiditis: Clinically quiescent today, but intermittently active..  6. Tinea pedis: This problem had improved greatly after using ketoconazole more often, but has worsened since she stopped using the ketoconazole.   7. Hypertension:  Her BP is worse today. She needs to re-start lisinopril and exercise daily.   8. Overweight: She recognizes that she has to eat more sensibly and exercise regularly in order to bring her weight down to where it should be.  9. Non-compliance: She is not doing as well as she did last year. 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 I told her that she needs to take better care of herself.    PLAN: 1. Diagnostic: Annual surveillance labs the week before next visit. Call in 4 weeks with BG results.  2. Therapeutic: Get back on plan. Continue Lantus dose of 40 units for now, but be prepared to reduce the Lantus dose when you are taking more Novolog. We'll adjust Lantus doses based upon her BG numbers. Follow the bedtime snack table and bedtime sliding scale table. Continue the current Novolog insulin plan, but adjust doses as needed. As much as possible, check BGs before eating and at bedtime. Continue Synthroid at 37.5 mcg/day. Resume lisinopril, 2.5 mg/day.  Try not to over-treat low BGs. Apply ketoconazole 2% cream, to her feet once daily.  3. Patient education: We discussed the use of lisinopril for renal protection, but also the warning not to get pregnant while on lisinopril. We also discussed the long-term adverse effects of higher BGs. Traci Rivas knows what to do to control her BGs.. 4. Follow-up: 3 months  Level of Service: This visit lasted in excess of 60 minutes. More than 50% of the visit was devoted to counseling.  David StallBRENNAN,Trenee Igoe J

## 2015-10-12 DIAGNOSIS — Z23 Encounter for immunization: Secondary | ICD-10-CM | POA: Diagnosis not present

## 2015-10-12 MED ORDER — SYNTHROID 25 MCG PO TABS: ORAL_TABLET | ORAL | Status: DC

## 2015-10-12 MED ORDER — KETOCONAZOLE 2 % EX CREA: TOPICAL_CREAM | CUTANEOUS | Status: DC

## 2015-10-12 MED ORDER — LISINOPRIL 2.5 MG PO TABS: ORAL_TABLET | ORAL | Status: DC

## 2015-10-26 ENCOUNTER — Encounter: Payer: BC Managed Care – PPO | Admitting: Women's Health

## 2015-11-07 ENCOUNTER — Telehealth: Payer: Self-pay | Admitting: "Endocrinology

## 2015-11-07 NOTE — Telephone Encounter (Signed)
Given to mother

## 2015-11-11 ENCOUNTER — Telehealth: Payer: Self-pay | Admitting: "Endocrinology

## 2015-11-11 NOTE — Telephone Encounter (Signed)
1.Subjective:  I got a call from mom. Traci Rivas ran out of both her Lantus and Novolog insulin today. Her [facility] pharmacy has closed. She has been drinking water. Her BG increased to 469. She does not have any ketone strips. Traci Rivas normally takes 40 units of Lantus each night and takes Novolog according to her 150/50/15 plan. Her last dose of Lantus was last night. Her last dose of Novolog was this morning.  2. Assessment: Traci Rivas had hyperglycemia after taking Lantus last night and Novolog this morning, but being without Novolog at lunch and dinner. She is now throwing up. She may have gastroenteritis, DKA, or a combination of both. 3. Plan: I asked mother to give Traci Rivas 7 units of Novolog now. Mom will then go to a local pharmacy and purchase ketones test strips. Traci Rivas will check her ketones and call me. If she has significant ketosis and /or if the vomiting persists, [facility].  David StallBRENNAN,Joley Utecht J

## 2015-11-15 ENCOUNTER — Ambulatory Visit: Payer: BLUE CROSS/BLUE SHIELD | Admitting: Medical

## 2015-11-21 ENCOUNTER — Encounter: Payer: BLUE CROSS/BLUE SHIELD | Admitting: Women's Health

## 2015-11-27 ENCOUNTER — Encounter: Payer: Self-pay | Admitting: Family Medicine

## 2015-11-27 ENCOUNTER — Ambulatory Visit (INDEPENDENT_AMBULATORY_CARE_PROVIDER_SITE_OTHER): Payer: BLUE CROSS/BLUE SHIELD | Admitting: Family Medicine

## 2015-11-27 VITALS — BP 104/62 | HR 85 | Temp 98.4°F | Wt 192.0 lb

## 2015-11-27 DIAGNOSIS — J029 Acute pharyngitis, unspecified: Secondary | ICD-10-CM | POA: Diagnosis not present

## 2015-11-27 DIAGNOSIS — J069 Acute upper respiratory infection, unspecified: Secondary | ICD-10-CM | POA: Diagnosis not present

## 2015-11-27 MED ORDER — LEVOCETIRIZINE DIHYDROCHLORIDE 5 MG PO TABS: 5.0000 mg | ORAL_TABLET | Freq: Every evening | ORAL | Status: DC

## 2015-11-27 MED ORDER — FLUTICASONE PROPIONATE 50 MCG/ACT NA SUSP: 2.0000 | Freq: Every day | NASAL | Status: DC

## 2015-11-27 NOTE — Progress Notes (Signed)
Pre visit review using our clinic review tool, if applicable. No additional management support is needed unless otherwise documented below in the visit note. 

## 2015-11-27 NOTE — Patient Instructions (Signed)

## 2015-11-27 NOTE — Progress Notes (Signed)
  Subjective:     Traci Rivas is a 25 y.o. female who presents for evaluation of sore throat. Associated symptoms include headache, post nasal drip, sinus and nasal congestion and sore throat. Onset of symptoms was 2 days ago, and have been gradually worsening since that time. She is drinking plenty of fluids. She has had a recent close exposure to someone with proven streptococcal pharyngitis.  The following portions of the patient's history were reviewed and updated as appropriate:  She  has a past medical history of Diabetes mellitus; Hypoglycemia associated with diabetes (HCC); Dysmenorrhea; Hypothyroidism, acquired, autoimmune; and Thyroiditis, autoimmune. She  does not have any pertinent problems on file. She  has no past surgical history on file. Her family history includes Cancer in her paternal grandmother; Diabetes in her sister; Heart attack in her paternal grandfather; Heart disease in her paternal grandfather; Hypothyroidism in her sister. She  reports that she has never smoked. She has never used smokeless tobacco. She reports that she does not drink alcohol or use illicit drugs. She has a current medication list which includes the following prescription(s): glucagon, ketoconazole, lantus solostar, lisinopril, novolog flexpen, and synthroid. Current Outpatient Prescriptions on File Prior to Visit  Medication Sig Dispense Refill  . glucagon 1 MG injection Follow package directions for low blood sugar. 2 each 4  . ketoconazole (NIZORAL) 2 % cream Apply to feet twice daily. 30 g 6  . LANTUS SOLOSTAR 100 UNIT/ML Solostar Pen USE UP TO 50 UNITS DAILY 5 pen 6  . lisinopril (PRINIVIL,ZESTRIL) 2.5 MG tablet Take one 2.5 mg tablet daily. 30 tablet 11  . NOVOLOG FLEXPEN 100 UNIT/ML FlexPen INJECT UP TO 50 UNITS SUBCUTANEOUSLY ONCE DAILY 15 pen 6  . SYNTHROID 25 MCG tablet Take 1.5 Synthroid 25 mcg tablets each morning. 45 tablet 12   No current facility-administered medications on file  prior to visit.   She is allergic to amoxicillin and penicillins..  Review of Systems Pertinent items are noted in HPI.    Objective:    BP 104/62 mmHg  Pulse 85  Temp(Src) 98.4 F (36.9 C) (Oral)  Wt 192 lb (87.091 kg)  SpO2 98% General appearance: alert, cooperative, appears stated age and no distress Ears: normal TM's and external ear canals both ears Nose: clear discharge, mild congestion, turbinates red, swollen, no sinus tenderness Throat: lips, mucosa, and tongue normal; teeth and gums normal Neck: no adenopathy, supple, symmetrical, trachea midline and thyroid not enlarged, symmetric, no tenderness/mass/nodules Lungs: clear to auscultation bilaterally Heart: S1, S2 normal  Laboratory Strep test not done2. Results:throat culture done .    Assessment:    Acute pharyngitis, likely  Viral pharyngitis.    Plan:    Use of OTC analgesics recommended as well as salt water gargles. Follow up as needed. flonase and antihistamine

## 2015-11-30 ENCOUNTER — Encounter: Payer: Self-pay | Admitting: Women's Health

## 2015-11-30 ENCOUNTER — Ambulatory Visit (INDEPENDENT_AMBULATORY_CARE_PROVIDER_SITE_OTHER): Payer: BLUE CROSS/BLUE SHIELD | Admitting: Women's Health

## 2015-11-30 ENCOUNTER — Telehealth: Payer: Self-pay | Admitting: *Deleted

## 2015-11-30 VITALS — BP 124/80 | Ht 62.0 in | Wt 192.0 lb

## 2015-11-30 DIAGNOSIS — B373 Candidiasis of vulva and vagina: Secondary | ICD-10-CM | POA: Diagnosis not present

## 2015-11-30 DIAGNOSIS — B3731 Acute candidiasis of vulva and vagina: Secondary | ICD-10-CM

## 2015-11-30 DIAGNOSIS — E039 Hypothyroidism, unspecified: Secondary | ICD-10-CM

## 2015-11-30 DIAGNOSIS — E108 Type 1 diabetes mellitus with unspecified complications: Secondary | ICD-10-CM

## 2015-11-30 DIAGNOSIS — Z01419 Encounter for gynecological examination (general) (routine) without abnormal findings: Secondary | ICD-10-CM

## 2015-11-30 LAB — URINALYSIS W MICROSCOPIC + REFLEX CULTURE
BACTERIA UA: NONE SEEN [HPF]
BILIRUBIN URINE: NEGATIVE
CRYSTALS: NONE SEEN [HPF]
Casts: NONE SEEN [LPF]
Glucose, UA: NEGATIVE
HGB URINE DIPSTICK: NEGATIVE
KETONES UR: NEGATIVE
Leukocytes, UA: NEGATIVE
Nitrite: NEGATIVE
PROTEIN: NEGATIVE
RBC / HPF: NONE SEEN RBC/HPF (ref <=2)
Specific Gravity, Urine: 1.018 (ref 1.001–1.035)
WBC UA: NONE SEEN WBC/HPF (ref <=5)
Yeast: NONE SEEN [HPF]
pH: 5 (ref 5.0–8.0)

## 2015-11-30 LAB — WET PREP FOR TRICH, YEAST, CLUE
Clue Cells Wet Prep HPF POC: NONE SEEN
Trich, Wet Prep: NONE SEEN

## 2015-11-30 LAB — CULTURE, GROUP A STREP: Organism ID, Bacteria: NORMAL

## 2015-11-30 MED ORDER — FLUCONAZOLE 100 MG PO TABS: 100.0000 mg | ORAL_TABLET | Freq: Every day | ORAL | Status: DC

## 2015-11-30 NOTE — Telephone Encounter (Signed)
Referral placed they will contact pt to schedule. 

## 2015-11-30 NOTE — Patient Instructions (Signed)
Monilial Vaginitis Vaginitis in a soreness, swelling and redness (inflammation) of the vagina and vulva. Monilial vaginitis is not a sexually transmitted infection. CAUSES  Yeast vaginitis is caused by yeast (candida) that is normally found in your vagina. With a yeast infection, the candida has overgrown in number to a point that upsets the chemical balance. SYMPTOMS   White, thick vaginal discharge.  Swelling, itching, redness and irritation of the vagina and possibly the lips of the vagina (vulva).  Burning or painful urination.  Painful intercourse. DIAGNOSIS  Things that may contribute to monilial vaginitis are:  Postmenopausal and virginal states.  Pregnancy.  Infections.  Being tired, sick or stressed, especially if you had monilial vaginitis in the past.  Diabetes. Good control will help lower the chance.  Birth control pills.  Tight fitting garments.  Using bubble bath, feminine sprays, douches or deodorant tampons.  Taking certain medications that kill germs (antibiotics).  Sporadic recurrence can occur if you become ill. TREATMENT  Your caregiver will give you medication.  There are several kinds of anti monilial vaginal creams and suppositories specific for monilial vaginitis. For recurrent yeast infections, use a suppository or cream in the vagina 2 times a week, or as directed.  Anti-monilial or steroid cream for the itching or irritation of the vulva may also be used. Get your caregiver's permission.  Painting the vagina with methylene blue solution may help if the monilial cream does not work.  Eating yogurt may help prevent monilial vaginitis. HOME CARE INSTRUCTIONS   Finish all medication as prescribed.  Do not have sex until treatment is completed or after your caregiver tells you it is okay.  Take warm sitz baths.  Do not douche.  Do not use tampons, especially scented ones.  Wear cotton underwear.  Avoid tight pants and panty  hose.  Tell your sexual partner that you have a yeast infection. They should go to their caregiver if they have symptoms such as mild rash or itching.  Your sexual partner should be treated as well if your infection is difficult to eliminate.  Practice safer sex. Use condoms.  Some vaginal medications cause latex condoms to fail. Vaginal medications that harm condoms are:  Cleocin cream.  Butoconazole (Femstat).  Terconazole (Terazol) vaginal suppository.  Miconazole (Monistat) (may be purchased over the counter). SEEK MEDICAL CARE IF:   You have a temperature by mouth above 102 F (38.9 C).  The infection is getting worse after 2 days of treatment.  The infection is not getting better after 3 days of treatment.  You develop blisters in or around your vagina.  You develop vaginal bleeding, and it is not your menstrual period.  You have pain when you urinate.  You develop intestinal problems.  You have pain with sexual intercourse.   This information is not intended to replace advice given to you by your health care provider. Make sure you discuss any questions you have with your health care provider.   Document Released: 07/23/2005 Document Revised: 01/05/2012 Document Reviewed: 04/16/2015 Elsevier Interactive Patient Education 2016 [place] Maintenance, Female Adopting a healthy lifestyle and getting preventive care can go a long way to promote health and wellness. Talk with your health care provider about what schedule of regular examinations is right for you. This is a good chance for you to check in with your provider about disease prevention and staying healthy. In between checkups, there are plenty of things you can do on your own. Experts have done a  lot of research about which lifestyle changes and preventive measures are most likely to keep you healthy. Ask your health care provider for more information. WEIGHT AND DIET  Eat a healthy diet  Be  sure to include plenty of vegetables, fruits, low-fat dairy products, and lean protein.  Do not eat a lot of foods high in solid fats, added sugars, or salt.  Get regular exercise. This is one of the most important things you can do for your health.  Most adults should exercise for at least 150 minutes each week. The exercise should increase your heart rate and make you sweat (moderate-intensity exercise).  Most adults should also do strengthening exercises at least twice a week. This is in addition to the moderate-intensity exercise.  Maintain a healthy weight  Body mass index (BMI) is a measurement that can be used to identify possible weight problems. It estimates body fat based on height and weight. Your health care provider can help determine your BMI and help you achieve or maintain a healthy weight.  For females 44 years of age and older:   A BMI below 18.5 is considered underweight.  A BMI of 18.5 to 24.9 is normal.  A BMI of 25 to 29.9 is considered overweight.  A BMI of 30 and above is considered obese.  Watch levels of cholesterol and blood lipids  You should start having your blood tested for lipids and cholesterol at 25 years of age, then have this test every 5 years.  You may need to have your cholesterol levels checked more often if:  Your lipid or cholesterol levels are high.  You are older than 25 years of age.  You are at high risk for heart disease.  CANCER SCREENING   Lung Cancer  Lung cancer screening is recommended for adults 84-65 years old who are at high risk for lung cancer because of a history of smoking.  A yearly low-dose CT scan of the lungs is recommended for people who:  Currently smoke.  Have quit within the past 15 years.  Have at least a 30-pack-year history of smoking. A pack year is smoking an average of one pack of cigarettes a day for 1 year.  Yearly screening should continue until it has been 15 years since you  quit.  Yearly screening should stop if you develop a health problem that would prevent you from having lung cancer treatment.  Breast Cancer  Practice breast self-awareness. This means understanding how your breasts normally appear and feel.  It also means doing regular breast self-exams. Let your health care provider know about any changes, no matter how small.  If you are in your 20s or 30s, you should have a clinical breast exam (CBE) by a health care provider every 1-3 years as part of a regular health exam.  If you are 76 or older, have a CBE every year. Also consider having a breast X-ray (mammogram) every year.  If you have a family history of breast cancer, talk to your health care provider about genetic screening.  If you are at high risk for breast cancer, talk to your health care provider about having an MRI and a mammogram every year.  Breast cancer gene (BRCA) assessment is recommended for women who have family members with BRCA-related cancers. BRCA-related cancers include:  Breast.  Ovarian.  Tubal.  Peritoneal cancers.  Results of the assessment will determine the need for genetic counseling and BRCA1 and BRCA2 testing. Cervical Cancer Your health care  provider may recommend that you be screened regularly for cancer of the pelvic organs (ovaries, uterus, and vagina). This screening involves a pelvic examination, including checking for microscopic changes to the surface of your cervix (Pap test). You may be encouraged to have this screening done every 3 years, beginning at age 17.  For women ages 32-65, health care providers may recommend pelvic exams and Pap testing every 3 years, or they may recommend the Pap and pelvic exam, combined with testing for human papilloma virus (HPV), every 5 years. Some types of HPV increase your risk of cervical cancer. Testing for HPV may also be done on women of any age with unclear Pap test results.  Other health care providers may  not recommend any screening for nonpregnant women who are considered low risk for pelvic cancer and who do not have symptoms. Ask your health care provider if a screening pelvic exam is right for you.  If you have had past treatment for cervical cancer or a condition that could lead to cancer, you need Pap tests and screening for cancer for at least 20 years after your treatment. If Pap tests have been discontinued, your risk factors (such as having a new sexual partner) need to be reassessed to determine if screening should resume. Some women have medical problems that increase the chance of getting cervical cancer. In these cases, your health care provider may recommend more frequent screening and Pap tests. Colorectal Cancer  This type of cancer can be detected and often prevented.  Routine colorectal cancer screening usually begins at 25 years of age and continues through 25 years of age.  Your health care provider may recommend screening at an earlier age if you have risk factors for colon cancer.  Your health care provider may also recommend using home test kits to check for hidden blood in the stool.  A small camera at the end of a tube can be used to examine your colon directly (sigmoidoscopy or colonoscopy). This is done to check for the earliest forms of colorectal cancer.  Routine screening usually begins at age 57.  Direct examination of the colon should be repeated every 5-10 years through 25 years of age. However, you may need to be screened more often if early forms of precancerous polyps or small growths are found. Skin Cancer  Check your skin from head to toe regularly.  Tell your health care provider about any new moles or changes in moles, especially if there is a change in a mole's shape or color.  Also tell your health care provider if you have a mole that is larger than the size of a pencil eraser.  Always use sunscreen. Apply sunscreen liberally and repeatedly  throughout the day.  Protect yourself by wearing long sleeves, pants, a wide-brimmed hat, and sunglasses whenever you are outside. HEART DISEASE, DIABETES, AND HIGH BLOOD PRESSURE   High blood pressure causes heart disease and increases the risk of stroke. High blood pressure is more likely to develop in:  People who have blood pressure in the high end of the normal range (130-139/85-89 mm Hg).  People who are overweight or obese.  People who are African American.  If you are 36-38 years of age, have your blood pressure checked every 3-5 years. If you are 56 years of age or older, have your blood pressure checked every year. You should have your blood pressure measured twice--once when you are at a hospital or clinic, and once when you are  not at a hospital or clinic. Record the average of the two measurements. To check your blood pressure when you are not at a hospital or clinic, you can use:  An automated blood pressure machine at a pharmacy.  A home blood pressure monitor.  If you are between 25 years and 45 years old, ask your health care provider if you should take aspirin to prevent strokes.  Have regular diabetes screenings. This involves taking a blood sample to check your fasting blood sugar level.  If you are at a normal weight and have a low risk for diabetes, have this test once every three years after 25 years of age.  If you are overweight and have a high risk for diabetes, consider being tested at a younger age or more often. PREVENTING INFECTION  Hepatitis B  If you have a higher risk for hepatitis B, you should be screened for this virus. You are considered at high risk for hepatitis B if:  You were born in a country where hepatitis B is common. Ask your health care provider which countries are considered high risk.  Your parents were born in a high-risk country, and you have not been immunized against hepatitis B (hepatitis B vaccine).  You have HIV or  AIDS.  You use needles to inject street drugs.  You live with someone who has hepatitis B.  You have had sex with someone who has hepatitis B.  You get hemodialysis treatment.  You take certain medicines for conditions, including cancer, organ transplantation, and autoimmune conditions. Hepatitis C  Blood testing is recommended for:  Everyone born from [birth year] through 1965.  Anyone with known risk factors for hepatitis C. Sexually transmitted infections (STIs)  You should be screened for sexually transmitted infections (STIs) including gonorrhea and chlamydia if:  You are sexually active and are younger than 25 years of age.  You are older than 25 years of age and your health care provider tells you that you are at risk for this type of infection.  Your sexual activity has changed since you were last screened and you are at an increased risk for chlamydia or gonorrhea. Ask your health care provider if you are at risk.  If you do not have HIV, but are at risk, it may be recommended that you take a prescription medicine daily to prevent HIV infection. This is called pre-exposure prophylaxis (PrEP). You are considered at risk if:  You are sexually active and do not regularly use condoms or know the HIV status of your partner(s).  You take drugs by injection.  You are sexually active with a partner who has HIV. Talk with your health care provider about whether you are at high risk of being infected with HIV. If you choose to begin PrEP, you should first be tested for HIV. You should then be tested every 3 months for as long as you are taking PrEP.  PREGNANCY   If you are premenopausal and you may become pregnant, ask your health care provider about preconception counseling.  If you may become pregnant, take 400 to 800 micrograms (mcg) of folic acid every day.  If you want to prevent pregnancy, talk to your health care provider about birth control (contraception). OSTEOPOROSIS AND  MENOPAUSE   Osteoporosis is a disease in which the bones lose minerals and strength with aging. This can result in serious bone fractures. Your risk for osteoporosis can be identified using a bone density scan.  If you are 2  years of age or older, or if you are at risk for osteoporosis and fractures, ask your health care provider if you should be screened.  Ask your health care provider whether you should take a calcium or vitamin D supplement to lower your risk for osteoporosis.  Menopause may have certain physical symptoms and risks.  Hormone replacement therapy may reduce some of these symptoms and risks. Talk to your health care provider about whether hormone replacement therapy is right for you.  HOME CARE INSTRUCTIONS   Schedule regular health, dental, and eye exams.  Stay current with your immunizations.   Do not use any tobacco products including cigarettes, chewing tobacco, or electronic cigarettes.  If you are pregnant, do not drink alcohol.  If you are breastfeeding, limit how much and how often you drink alcohol.  Limit alcohol intake to no more than 1 drink per day for nonpregnant women. One drink equals 12 ounces of beer, 5 ounces of wine, or 1 ounces of hard liquor.  Do not use street drugs.  Do not share needles.  Ask your health care provider for help if you need support or information about quitting drugs.  Tell your health care provider if you often feel depressed.  Tell your health care provider if you have ever been abused or do not feel safe at home.   This information is not intended to replace advice given to you by your health care provider. Make sure you discuss any questions you have with your health care provider.   Document Released: 04/28/2011 Document Revised: 11/03/2014 Document Reviewed: 09/14/2013 Elsevier Interactive Patient Education [employer].

## 2015-11-30 NOTE — Progress Notes (Signed)
Traci Rivas 23-Sep-1991 [id number]    History:    Presents for annual exam.  Regular monthly cycle/virgin. Gardasil series completed. Normal Pap history. Had been on Seasonale for dysmenorrhea has been off for greater than 6 months with no problems. Type 1 diabetes, hypothyroid, currently seeing Dr. Fransico Michael would like to see an adult endocrinologist.  Past medical history, past surgical history, family history and social history were all reviewed and documented in the EPIC chart. 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 Sister also type 1 diabetes, parents healthy.  ROS:  A ROS was performed and pertinent positives and negatives are included.  Exam:  Filed Vitals:   11/30/15 0804  BP: 124/80    General appearance:  Normal Thyroid:  Symmetrical, normal in size, without palpable masses or nodularity. Respiratory  Auscultation:  Clear without wheezing or rhonchi Cardiovascular  Auscultation:  Regular rate, without rubs, murmurs or gallops  Edema/varicosities:  Not grossly evident Abdominal  Soft,nontender, without masses, guarding or rebound.  Liver/spleen:  No organomegaly noted  Hernia:  None appreciated  Skin  Inspection:  Grossly normal   Breasts: Examined lying and sitting.     Right: Without masses, retractions, discharge or axillary adenopathy.     Left: Without masses, retractions, discharge or axillary adenopathy. Gentitourinary   Inguinal/mons:  Normal without inguinal adenopathy  External genitalia:  Normal  BUS/Urethra/Skene's glands:  Normal  Vagina:  Normal yeast vaginitis  Cervix:  Normal  Uterus:   normal in size, shape and contour.  Midline and mobile  Adnexa/parametria:     Rt: Without masses or tenderness.   Lt: Without masses or tenderness.  Anus and perineum: Normal    Assessment/Plan:  25 y.o. S [place] Virgin for annual exam with no complaints.  Regular monthly cycle Type 1 diabetes-fair  control Hypothyroid Yeast vaginitis  Plan: Currently seeing a pediatric endocrinologist, will refer to Dr. Duard Brady. Diflucan 100  2 tablets today, prescription, proper use given and reviewed, aware of yeast prevention. SBE's, exercise, calcium rich diet, MVI daily encouraged. Declines need for contraception. UA. Pap normal 2014, new screening guidelines reviewed.  [name] [facility], 8:32 AM 11/30/2015

## 2015-11-30 NOTE — Telephone Encounter (Signed)
-----   Message from [name], NP sent at 11/30/2015  8:52 AM EST ----- Please schedule referral appointment for Dr. Duard Brady, she is type I diabetic, hypothyroid, not at goal, she has been seeing a pediatric endocrinologist Dr. Fransico Michael. She is due to see Dr. Fransico Michael again in March so an appointment in March would be okay. Diagnosed with type 1 diabetes at age 25.

## 2015-12-03 NOTE — Telephone Encounter (Signed)
Appointment 01/10/16 @ 8:15am with Dr.Gherge

## 2016-01-10 ENCOUNTER — Ambulatory Visit: Payer: BLUE CROSS/BLUE SHIELD | Admitting: Internal Medicine

## 2016-01-17 ENCOUNTER — Ambulatory Visit: Payer: BLUE CROSS/BLUE SHIELD | Admitting: "Endocrinology

## 2016-03-13 ENCOUNTER — Ambulatory Visit (INDEPENDENT_AMBULATORY_CARE_PROVIDER_SITE_OTHER): Payer: BLUE CROSS/BLUE SHIELD | Admitting: Internal Medicine

## 2016-03-13 ENCOUNTER — Encounter: Payer: Self-pay | Admitting: Internal Medicine

## 2016-03-13 ENCOUNTER — Other Ambulatory Visit (INDEPENDENT_AMBULATORY_CARE_PROVIDER_SITE_OTHER): Payer: BLUE CROSS/BLUE SHIELD | Admitting: *Deleted

## 2016-03-13 VITALS — BP 118/76 | HR 96 | Ht 63.0 in | Wt 189.0 lb

## 2016-03-13 DIAGNOSIS — E063 Autoimmune thyroiditis: Secondary | ICD-10-CM

## 2016-03-13 DIAGNOSIS — E1065 Type 1 diabetes mellitus with hyperglycemia: Secondary | ICD-10-CM | POA: Diagnosis not present

## 2016-03-13 DIAGNOSIS — E038 Other specified hypothyroidism: Secondary | ICD-10-CM | POA: Diagnosis not present

## 2016-03-13 DIAGNOSIS — E109 Type 1 diabetes mellitus without complications: Secondary | ICD-10-CM | POA: Diagnosis not present

## 2016-03-13 LAB — MICROALBUMIN / CREATININE URINE RATIO
CREATININE, U: 81.4 mg/dL
Microalb Creat Ratio: 0.9 mg/g (ref 0.0–30.0)
Microalb, Ur: <0.7 mg/dL (ref 0.0–1.9)

## 2016-03-13 LAB — COMPLETE METABOLIC PANEL WITH GFR
ALBUMIN: 3.7 g/dL (ref 3.6–5.1)
ALK PHOS: 90 U/L (ref 33–115)
ALT: 23 U/L (ref 6–29)
AST: 25 U/L (ref 10–30)
BUN: 9 mg/dL (ref 7–25)
CALCIUM: 9 mg/dL (ref 8.6–10.2)
CHLORIDE: 96 mmol/L — AB (ref 98–110)
CO2: 25 mmol/L (ref 20–31)
Creat: 0.65 mg/dL (ref 0.50–1.10)
GFR, Est African American: >89 mL/min (ref >=60)
GFR, Est Non African American: >89 mL/min (ref >=60)
Glucose, Bld: 316 mg/dL — ABNORMAL HIGH (ref 65–99)
POTASSIUM: 4.9 mmol/L (ref 3.5–5.3)
Sodium: 135 mmol/L (ref 135–146)
Total Bilirubin: 0.5 mg/dL (ref 0.2–1.2)
Total Protein: 6 g/dL — ABNORMAL LOW (ref 6.1–8.1)

## 2016-03-13 LAB — POCT GLYCOSYLATED HEMOGLOBIN (HGB A1C): Hemoglobin A1C: 10.5

## 2016-03-13 LAB — LIPID PANEL
CHOLESTEROL: 159 mg/dL (ref 0–200)
HDL: 44.7 mg/dL (ref >39.00)
LDL CALC: 94 mg/dL (ref 0–99)
NonHDL: 114.44
TRIGLYCERIDES: 101 mg/dL (ref 0.0–149.0)
Total CHOL/HDL Ratio: 4
VLDL: 20.2 mg/dL (ref 0.0–40.0)

## 2016-03-13 LAB — TSH: TSH: 3.01 u[IU]/mL (ref 0.35–4.50)

## 2016-03-13 NOTE — Progress Notes (Signed)
Patient ID: Traci Rivas, female   DOB: 1990/12/14, 25 y.o.   MRN: [id number]  HPI: Traci Rivas is a 25 y.o.-year-old female, referred by her Elon Alas, NP Harle Battiest), for management of DM1, uncontrolled, without complications and Hashimoto's hypothyroidism.  Patient has been diagnosed with diabetes in 2010 (age 40); she started on insulin at dx.   Last hemoglobin A1c was: Lab Results  Component Value Date   HGBA1C 9.0 10/11/2015   HGBA1C 9.2* 10/24/2014   HGBA1C 8.8 10/26/2013   Pt is on: - Lantus 40 >> 35 units at bedtime - Novolog SSI - after meals (!) - ICR 15, target 150, ISF 50   Pt checks her sugars 3x a day and they are (no log, no meter) - after she started a diet 2 weeks ago (lower carb): - am: 80-150 - 2h after b'fast: n/c - before lunch: 150-200 - 2h after lunch: n/c - before dinner: 200s - 2h after dinner: n/c - goes to the gym 8-10 pm - bedtime: 150-200 - nighttime: 50s-70s  - resolved after decreased Lantus + lows. Lowest sugar was 52 (overbolused for lunch); she has hypoglycemia awareness at 80. No previous hypoglycemia admission.ucagon kit at home. Highest sugar was 500 after running out of insulin. No previous DKA admissions.    Pt's meals are: - Breakfast: Banana and shake or smoothie - Lunch: Quinoa + chicken or salad - Dinner: Shake + fruit or salad - Snacks: 1 in a.m. and 1 in p.m. Exercise: Cardio and weights 3-5 times a week  - no CKD, last BUN/creatinine:  Lab Results  Component Value Date   BUN 9 12/08/2014   CREATININE 0.69 12/08/2014  Was on Lisinopril >> stopped. - last set of lipids: Lab Results  Component Value Date   CHOL 145 12/07/2014   HDL 48 12/07/2014   LDLCALC 83 12/07/2014   TRIG 69 12/07/2014   CHOLHDL 3.0 12/07/2014   - last eye exam was in 2016. No DR.  - no numbness and tingling in her feet.  Also has Hashimoto's hypothyroidism: - on LT4 1.5 tabs of 25 mcg daily - Last TSH: Lab Results  Component Value Date    TSH 1.999 12/07/2014   Pt has FH of DM1 in sister.  ROS: Constitutional: no weight gain/loss, no fatigue, no subjective hyperthermia/hypothermia Eyes:+ blurry vision, no xerophthalmia ENT: no sore throat, no nodules palpated in throat, no dysphagia/odynophagia, no hoarseness Cardiovascular: no CP/SOB/palpitations/leg swelling Respiratory: no cough/SOB Gastrointestinal: no N/V/D/C/+ heartburn Musculoskeletal: no muscle/joint aches Skin: no rashes Neurological: no tremors/numbness/tingling/dizziness, + HA Psychiatric: no depression/anxiety  Past Medical History  Diagnosis Date  . Diabetes mellitus     type 1  . Hypoglycemia associated with diabetes ([place])   . Dysmenorrhea   . Hypothyroidism, acquired, autoimmune   . Thyroiditis, autoimmune    No past surgical history on file. Social History   Social History  . Marital Status: Single    Spouse Name: N/A  . Number of Children: 0   Occupational History  . Not on file.   Social History Main Topics  . Smoking status: Never Smoker   . Smokeless tobacco: Never Used  . Alcohol Use: No  . Drug Use: No  . Sexual Activity: Not Currently    Birth Control/ Protection: Pill   Current Outpatient Prescriptions on File Prior to Visit  Medication Sig Dispense Refill  . glucagon 1 MG injection Follow package directions for low blood sugar. (Patient taking differently: once as needed. Follow  package directions for low blood sugar.) 2 each 4  . LANTUS SOLOSTAR 100 UNIT/ML Solostar Pen USE UP TO 50 UNITS DAILY (Patient taking differently: USE UP TO 50 UNITS AT BEDTIME) 5 pen 6  . lisinopril (PRINIVIL,ZESTRIL) 2.5 MG tablet Take one 2.5 mg tablet daily. 30 tablet 11  . NOVOLOG FLEXPEN 100 UNIT/ML FlexPen INJECT UP TO 50 UNITS SUBCUTANEOUSLY ONCE DAILY (Patient taking differently: INJECT UP TO 50 UNITS SUBCUTANEOUSLY DAILY) 15 pen 6  . SYNTHROID 25 MCG tablet Take 1.5 Synthroid 25 mcg tablets each morning. 45 tablet 12  . levocetirizine  (XYZAL) 5 MG tablet Take 1 tablet (5 mg total) by mouth every evening. (Patient not taking: Reported on 03/13/2016) 30 tablet 5   No current facility-administered medications on file prior to visit.   Allergies  Allergen Reactions  . Amoxicillin   . Penicillins    Family History  Problem Relation Age of Onset  . Heart attack Paternal Grandfather   . Heart disease Paternal Grandfather   . Diabetes Sister   . Hypothyroidism Sister   . Cancer Paternal Grandmother    PE: BP 118/76 mmHg  Pulse 96  Ht '5\' 3"'  (1.6 m)  Wt 189 lb (85.73 kg)  BMI 33.49 kg/m2  SpO2 98% Wt Readings from Last 3 Encounters:  03/13/16 189 lb (85.73 kg)  11/30/15 192 lb (87.091 kg)  11/27/15 192 lb (87.091 kg)   Constitutional: overweight, in NAD Eyes: PERRLA, EOMI, no exophthalmos ENT: moist mucous membranes, no thyromegaly, no cervical lymphadenopathy Cardiovascular: RRR, No MRG Respiratory: CTA B Gastrointestinal: abdomen soft, NT, ND, BS+ Musculoskeletal: no deformities, strength intact in all 4 Skin: moist, warm, no rashes Neurological: no tremor with outstretched hands, DTR normal in all 4  ASSESSMENT: 1. DM1, uncontrolled, without complications  2. Hashimoto's hypothyroidism  PLAN:  1. Patient with long-standing, uncontrolled DM1, on insulin therapy. Her HbA1c today is higher (10.5%)! Patient has had poor diabetes control since diagnosis, as she is noncompliant with blood sugar checks and insulin doses. She recently started the lower carb diet and she had to reduce the dose of Lantus because she was developing hypoglycemia at night. She is checking her sugars sparingly before meals, but she boluses after meals, which I advised her to change. She also has a pattern of increasing blood sugars as the day goes by, a sign of not enough mealtime coverage. Therefore, I will decrease her insulin to carb ratio and target and advised her to move the insulin bolus before meals. I also feel that she is not  carb counting correctly and may need a referral to nutrition for this. For now, I advised her to use a phone app for this (see below). - she is not interested - We discussed about changes to her insulin regimen, as follows:  Patient Instructions  Please continue: - Lantus 35 units at bedtime.  Change NovoLog - inject 15 min before a meal: - ICR: 15 >> 10 - target: 150 >> 120 - ISF: 50  Please use the Calorie [employer] Pace or MyFitnessPal apps to calculate the carbs.  Please continue Synthroid daily.  Take the thyroid hormone every day, with water, at least 30 minutes before breakfast, separated by at least 4 hours from: - acid reflux medications - calcium - iron - multivitamins  Please stop at the lab.  Please return in 1.5 months with your sugar log.   - Strongly advised her to start checking sugars at different times of the day - check at  least 4 times a day, rotating checks - given sugar log and advised how to fill it and to bring it at next appt  - given foot care handout and explained the principles  - given instructions for hypoglycemia management "15-15 rule"  - advised for yearly eye exams - She has ketone strips at home - advised to always have Glu tablets with her - advised for a Med-alert bracelet mentioning "type 1 diabetes mellitus". - will refer to DM education for further help with the pump: carb counting check, basal rate validation, extended bolusing, sick days rules, etc. - given instruction Re: exercising and driving in DM1 (pt instructions) - Will check a new CMP and lipid panel, along with ACR today. She is off lisinopril. Will decide whether to restart her not after the results of her ACR are back. - Return to clinic in 1.5 mo with sugar log   2. Hashimoto's hypothyroidism - She is on 1.5 tab 25 g of Synthroid daily - We discussed about taking the medication correctly: every day, with water, at least 30 minutes before breakfast, separated by at least 4 hours  from: - acid reflux medications - calcium - iron - multivitamins She is taking it correctly. - Reviewed recent TSH level, which was normal 3 months ago - will recheck TSH today - Needs refills of her Synthroid    - time spent with the patient: 1 hour, of which >50% was spent in obtaining information about her 2 endocrine conditionsewing previous labs, office visit notes, and treatments, counseling pt about her conditions (please see the discussed topics above), and developing a plaimprove them.  Orders Only on 03/13/2016  Component Date Value Ref Range Status  . Hemoglobin A1C 03/13/2016 10.5   Final  Office Visit on 03/13/2016  Component Date Value Ref Range Status  . Microalb, Ur 03/13/2016 <0.7  0.0 - 1.9 mg/dL Final  . Creatinine,U 03/13/2016 81.4   Final  . Microalb Creat Ratio 03/13/2016 0.9  0.0 - 30.0 mg/g Final  . Sodium 03/13/2016 135  135 - 146 mmol/L Final  . Potassium 03/13/2016 4.9  3.5 - 5.3 mmol/L Final  . Chloride 03/13/2016 96* 98 - 110 mmol/L Final  . CO2 03/13/2016 25  20 - 31 mmol/L Final  . Glucose, Bld 03/13/2016 316* 65 - 99 mg/dL Final  . BUN 03/13/2016 9  7 - 25 mg/dL Final  . Creat 03/13/2016 0.65  0.50 - 1.10 mg/dL Final  . Total Bilirubin 03/13/2016 0.5  0.2 - 1.2 mg/dL Final  . Alkaline Phosphatase 03/13/2016 90  33 - 115 U/L Final  . AST 03/13/2016 25  10 - 30 U/L Final  . ALT 03/13/2016 23  6 - 29 U/L Final  . Total Protein 03/13/2016 6.0* 6.1 - 8.1 g/dL Final  . Albumin 03/13/2016 3.7  3.6 - 5.1 g/dL Final  . Calcium 03/13/2016 9.0  8.6 - 10.2 mg/dL Final  . GFR, Est African American 03/13/2016 >89  >=60 mL/min Final  . GFR, Est Non African American 03/13/2016 >89  >=60 mL/min Final   Comment:   The estimated GFR is a calculation valid for adults (>=34 years old) that uses the CKD-EPI algorithm to adjust for age and sex. It is   not to be used for children, pregnant women, hospitalized patients,    patients on dialysis, or with rapidly changing  kidney function. According to the NKDEP, eGFR >89 is normal, 60-89 shows mild impairment, 30-59 shows moderate impairment, 15-29 shows severe  impairment and <15 is ESRD.     Marland Kitchen Cholesterol 03/13/2016 159  0 - 200 mg/dL Final   ATP III Classification       Desirable:  < 200 mg/dL               Borderline High:  200 - 239 mg/dL          High:  > = 240 mg/dL  . Triglycerides 03/13/2016 101.0  0.0 - 149.0 mg/dL Final   Normal:  <150 mg/dLBorderline High:  150 - 199 mg/dL  . HDL 03/13/2016 44.70  >39.00 mg/dL Final  . VLDL 03/13/2016 20.2  0.0 - 40.0 mg/dL Final  . LDL Cholesterol 03/13/2016 94  0 - 99 mg/dL Final  . Total CHOL/HDL Ratio 03/13/2016 4   Final                  Men          Women1/2 Average Risk     3.4          3.3Average Risk          5.0          4.42X Average Risk          9.6          7.13X Average Risk          15.0          11.0                      . NonHDL 03/13/2016 114.44   Final   NOTE:  Non-HDL goal should be 30 mg/dL higher than patient's LDL goal (i.e. LDL goal of < 70 mg/dL, would have non-HDL goal of < 100 mg/dL)  . TSH 03/13/2016 3.01  0.35 - 4.50 uIU/mL Final    Labs are normal, except glucose, which is very high.

## 2016-03-13 NOTE — Patient Instructions (Addendum)
Please continue: - Lantus 35 units at bedtime.  Change NovoLog - inject 15 min before a meal: - ICR: 15 >> 10 - target: 150 >> 120 - ISF: 50  Please use the Calorie [employer]oke D[employer]King or MyFitnessPal apps to calculate the carbs.  Please continue Synthroid 25 mcg daily.  Take the thyroid hormone every day, with water, at least 30 minutes before breakfast, separated by at least 4 hours from: - acid reflux medications - calcium - iron - multivitamins  Please stop at the lab.  Please return in 1.5 months with your sugar log.   Basic Rules for Patients with Type I Diabetes Mellitus  1. The American Diabetes Association (ADA) recommended targets: - fasting sugar <130 - after meal sugar <180 - HbA1C <7%  2. Engage in ?150 min moderate exercise per week  3. Make sure you have ?8h of sleep every night as this helps both blood sugars and your weight.  4. Always keep a sugar log (not only record in your meter) and bring it to all appointments with us.  5. "15-15 rule" for hypoglycemia: if sugars are low, take 15 g of carbs** ("fast sugar" - e.g. 4 glucose tablets, 4 oz orange juice), wait 15 min, then check sugars again. If still <80, repeat. Continue  until your sugars >80, then eat a normal meal.   6. Teach family members and coworkers to inject glucagon. Have a glucagon set at home and one at work. They should call 911 after using the set.  7. Check sugar before driving. If <100, correct, and only start driving if sugars rise ?130100. Check sugar every hour when on a long drive.  8. Check sugar before exercising. If <100, correct, and only start exercising if sugars rise ?100. Check sugar every hour when on a long exercise routine and 1h after you finished exercising.   If >250, check urine for ketones. If you have moderate-large ketones in urine, do not start exercise. Hydrate yourself with clear liquids and correct the high sugar. Recheck sugars and ketones before attempting to exercise.  Be  aware that you might need less insulin when exercising.  *intense, short, exercise bursts can increase your sugars, but  *less intense, longer (>1h), exercise routines can decrease your sugars.  If you are on a pump, you might need to decrease your basal rate by 10% or more (or even disconnect your pump) while you exercise to prevent low sugars. Do not disconnect your pump by more than 3 hours at a time! You also might need to decrease your insulin bolus for the meal prior to your exercise time by 20% or more.  9. Make sure you have a MedAlert bracelet or pendant mentioning "Type I Diabetes Mellitus". If you have a prior episode of severe hypoglycemia or hypoglycemia unawareness, it should also mention this.  10. Please do not walk barefoot. Inspect your feet for sores/cuts and let us know if you have them.  11. Please call [facility] with any questions and concerns.   **E.g. of "fast carbs": ? first choice (15 g):  1 tube glucose gel, GlucoPouch 15, 2 oz glucose liquid ? second choice (15-16 g):  3 or 4 glucose tablets (best taken  with water), 15 Dextrose Bits chewable ? third choice (15-20 g):   cup fruit juice,  cup regular soda, 1 cup skim milk,  1 cup sports drink ? fourth choice (15-20 g):  1 small tube [place] gel (not frosting), 2 tbsp raisins, 1 tbsp  table sugar,  candy, jelly beans, gum drops - check package for carb amount   (adapted from: Juluis Rainier. "Insulin therapy and hypoglycemia" Endocrinol Metab Clin N Am 2012, 41: 57-87)

## 2016-03-14 MED ORDER — SYNTHROID 25 MCG PO TABS: ORAL_TABLET | ORAL | Status: DC

## 2016-03-31 ENCOUNTER — Telehealth: Payer: Self-pay | Admitting: Internal Medicine

## 2016-03-31 ENCOUNTER — Other Ambulatory Visit: Payer: Self-pay | Admitting: "Endocrinology

## 2016-03-31 MED ORDER — INSULIN GLARGINE 100 UNIT/ML SOLOSTAR PEN: PEN_INJECTOR | SUBCUTANEOUS | Status: DC

## 2016-03-31 NOTE — Telephone Encounter (Signed)
Pt needs lantus called to [facility] on [address]

## 2016-04-28 ENCOUNTER — Ambulatory Visit: Payer: BLUE CROSS/BLUE SHIELD | Admitting: Internal Medicine

## 2016-05-09 ENCOUNTER — Ambulatory Visit (INDEPENDENT_AMBULATORY_CARE_PROVIDER_SITE_OTHER): Payer: BLUE CROSS/BLUE SHIELD | Admitting: Family Medicine

## 2016-05-09 ENCOUNTER — Encounter: Payer: Self-pay | Admitting: Family Medicine

## 2016-05-09 VITALS — BP 119/78 | HR 87 | Temp 98.5°F | Ht 63.0 in | Wt 183.4 lb

## 2016-05-09 DIAGNOSIS — Z0000 Encounter for general adult medical examination without abnormal findings: Secondary | ICD-10-CM | POA: Diagnosis not present

## 2016-05-09 DIAGNOSIS — Z111 Encounter for screening for respiratory tuberculosis: Secondary | ICD-10-CM | POA: Diagnosis not present

## 2016-05-09 DIAGNOSIS — E109 Type 1 diabetes mellitus without complications: Secondary | ICD-10-CM

## 2016-05-09 NOTE — Progress Notes (Signed)
Subjective:     Traci Rivas is a 25 y.o. female and is here for a comprehensive physical exam. The patient reports no problems.  She has a new job at [employer].    Social History   Social History  . Marital Status: Single    Spouse Name: N/A  . Number of Children: N/A  . Years of Education: N/A   Occupational History  . 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 Smoking status: Never Smoker   . Smokeless tobacco: Never Used  . Alcohol Use: No  . Drug Use: No  . Sexual Activity: No   Other Topics Concern  . Not on file   Social History Narrative   Health Maintenance  Topic Date Due  . PNEUMOCOCCAL POLYSACCHARIDE VACCINE (1) 03/17/1993  . HIV Screening  03/17/2006  . TETANUS/TDAP  03/17/2010  . FOOT EXAM  10/26/2014  . OPHTHALMOLOGY EXAM  01/02/2016  . INFLUENZA VACCINE  05/27/2016  . HEMOGLOBIN A1C  09/13/2016  . PAP SMEAR  10/18/2016  . URINE MICROALBUMIN  03/13/2017    The following portions of the patient's history were reviewed and updated as appropriate:  She  has a past medical history of Diabetes mellitus; Hypoglycemia associated with diabetes ([facility]); Dysmenorrhea; Hypothyroidism, acquired, autoimmune; and Thyroiditis, autoimmune. She  does not have any pertinent problems on file. She  has no past surgical history on file. Her family history includes Cancer in her paternal grandmother; Diabetes in her father and sister; Heart attack in her paternal grandfather; Heart disease in her paternal grandfather; Hypothyroidism in her sister. She  reports that she has never smoked. She has never used smokeless tobacco. She reports that she does not drink alcohol or use illicit drugs. She has a current medication list which includes the following prescription(s): glucagon, insulin glargine, levocetirizine, novolog flexpen, and synthroid. Current Outpatient Prescriptions on File Prior to Visit  Medication Sig Dispense Refill   . glucagon 1 MG injection Follow package directions for low blood sugar. (Patient taking differently: once as needed. Follow package directions for low blood sugar.) 2 each 4  . Insulin Glargine (LANTUS SOLOSTAR) 100 UNIT/ML Solostar Pen INJECT 35 UNITS AT BEDTIME 5 pen 2  . levocetirizine (XYZAL) 5 MG tablet Take 1 tablet (5 mg total) by mouth every evening. 30 tablet 5  . NOVOLOG FLEXPEN 100 UNIT/ML FlexPen INJECT UP TO 50 UNITS SUBCUTANEOUSLY ONCE DAILY (Patient taking differently: INJECT UP TO 50 UNITS SUBCUTANEOUSLY DAILY) 15 pen 6  . SYNTHROID 25 MCG tablet Take 1.5 Synthroid 25 mcg tablets each morning. 90 tablet 3   No current facility-administered medications on file prior to visit.   She is allergic to amoxicillin and penicillins..  Review of Systems Review of Systems  Constitutional: Negative for activity change, appetite change and fatigue.  HENT: Negative for hearing loss, congestion, tinnitus and ear discharge.  dentist [id number] Eyes: Negative for visual disturbance (see optho q1y -- vision corrected to 20/20 with glasses).  Respiratory: Negative for cough, chest tightness and shortness of breath.   Cardiovascular: Negative for chest pain, palpitations and leg swelling.  Gastrointestinal: Negative for abdominal pain, diarrhea, constipation and abdominal distention.  Genitourinary: Negative for urgency, frequency, decreased urine volume and difficulty urinating.  Musculoskeletal: Negative for back pain, arthralgias and gait problem.  Skin: Negative for color change, pallor and rash.  Neurological: Negative for dizziness, light-headedness, numbness and headaches.  Hematological: Negative for adenopathy. Does not  bruise/bleed easily.  Psychiatric/Behavioral: Negative for suicidal ideas, confusion, sleep disturbance, self-injury, dysphoric mood, decreased concentration and agitation.       Objective:    BP 119/78 mmHg  Pulse 87  Temp(Src) 98.5 F (36.9 C) (Oral)  Ht   (1.6 m)  Wt 183 lb 6.4 oz (83.19 kg)  BMI 32.50 kg/m2  SpO2 98%  LMP 04/25/2016 General appearance: alert, cooperative, appears stated age and no distress Head: Normocephalic, without obvious abnormality, atraumatic Eyes: conjunctivae/corneas clear. PERRL, EOM's intact. Fundi benign. Ears: normal TM's and external ear canals both ears Nose: Nares normal. Septum midline. Mucosa normal. No drainage or sinus tenderness. Throat: lips, mucosa, and tongue normal; teeth and gums normal Neck: no adenopathy, no carotid bruit, no JVD, supple, symmetrical, trachea midline and thyroid not enlarged, symmetric, no tenderness/mass/nodules Back: symmetric, no curvature. ROM normal. No CVA tenderness. Lungs: clear to auscultation bilaterally Breasts: normal appearance, no masses or tenderness Heart: regular rate and rhythm, S1, S2 normal, no murmur, click, rub or gallop Abdomen: soft, non-tender; bowel sounds normal; no masses,  no organomegaly Pelvic: deferred Extremities: extremities normal, atraumatic, no cyanosis or edema Pulses: 2+ and symmetric Skin: Skin color, texture, turgor normal. No rashes or lesions Lymph nodes: Cervical, supraclavicular, and axillary nodes normal. Neurologic: Alert and oriented X 3, normal strength and tone. Normal symmetric reflexes. Normal coordination and gait    Assessment:    Healthy female exam.      Plan:    ghm utd Check labs See After Visit Summary for Counseling Recommendations    1. Preventative health care  See above  2. Screening-pulmonary TB   - TB Skin Test

## 2016-05-09 NOTE — Assessment & Plan Note (Signed)
Per endo °

## 2016-05-09 NOTE — Patient Instructions (Signed)
Preventive Care for Adults, Female A healthy lifestyle and preventive care can promote health and wellness. Preventive health guidelines for women include the following key practices.  A routine yearly physical is a good way to check with your health care provider about your health and preventive screening. It is a chance to share any concerns and updates on your health and to receive a thorough exam.  Visit your dentist for a routine exam and preventive care every 6 months. Brush your teeth twice a day and floss once a day. Good oral hygiene prevents tooth decay and gum disease.  The frequency of eye exams is based on your age, health, family medical history, use of contact lenses, and other factors. Follow your health care provider's recommendations for frequency of eye exams.  Eat a healthy diet. Foods like vegetables, fruits, whole grains, low-fat dairy products, and lean protein foods contain the nutrients you need without too many calories. Decrease your intake of foods high in solid fats, added sugars, and salt. Eat the right amount of calories for you.Get information about a proper diet from your health care provider, if necessary.  Regular physical exercise is one of the most important things you can do for your health. Most adults should get at least 150 minutes of moderate-intensity exercise (any activity that increases your heart rate and causes you to sweat) each week. In addition, most adults need muscle-strengthening exercises on 2 or more days a week.  Maintain a healthy weight. The body mass index (BMI) is a screening tool to identify possible weight problems. It provides an estimate of body fat based on height and weight. Your health care provider can find your BMI and can help you achieve or maintain a healthy weight.For adults 20 years and older:  A BMI below 18.5 is considered underweight.  A BMI of 18.5 to 24.9 is normal.  A BMI of 25 to 29.9 is considered overweight.  A  BMI of 30 and above is considered obese.  Maintain normal blood lipids and cholesterol levels by exercising and minimizing your intake of saturated fat. Eat a balanced diet with plenty of fruit and vegetables. Blood tests for lipids and cholesterol should begin at age 45 and be repeated every 5 years. If your lipid or cholesterol levels are high, you are over 50, or you are at high risk for heart disease, you may need your cholesterol levels checked more frequently.Ongoing high lipid and cholesterol levels should be treated with medicines if diet and exercise are not working.  If you smoke, find out from your health care provider how to quit. If you do not use tobacco, do not start.  Lung cancer screening is recommended for adults aged 45-80 years who are at high risk for developing lung cancer because of a history of smoking. A yearly low-dose CT scan of the lungs is recommended for people who have at least a 30-pack-year history of smoking and are a current smoker or have quit within the past 15 years. A pack year of smoking is smoking an average of 1 pack of cigarettes a day for 1 year (for example: 1 pack a day for 30 years or 2 packs a day for 15 years). Yearly screening should continue until the smoker has stopped smoking for at least 15 years. Yearly screening should be stopped for people who develop a health problem that would prevent them from having lung cancer treatment.  If you are pregnant, do not drink alcohol. If you are  breastfeeding, be very cautious about drinking alcohol. If you are not pregnant and choose to drink alcohol, do not have more than 1 drink per day. One drink is considered to be 12 ounces (355 mL) of beer, 5 ounces (148 mL) of wine, or 1.5 ounces (44 mL) of liquor.  Avoid use of street drugs. Do not share needles with anyone. Ask for help if you need support or instructions about stopping the use of drugs.  High blood pressure causes heart disease and increases the risk  of stroke. Your blood pressure should be checked at least every 1 to 2 years. Ongoing high blood pressure should be treated with medicines if weight loss and exercise do not work.  If you are 55-79 years old, ask your health care provider if you should take aspirin to prevent strokes.  Diabetes screening is done by taking a blood sample to check your blood glucose level after you have not eaten for a certain period of time (fasting). If you are not overweight and you do not have risk factors for diabetes, you should be screened once every 3 years starting at age 45. If you are overweight or obese and you are 40-70 years of age, you should be screened for diabetes every year as part of your cardiovascular risk assessment.  Breast cancer screening is essential preventive care for women. You should practice "breast self-awareness." This means understanding the normal appearance and feel of your breasts and may include breast self-examination. Any changes detected, no matter how small, should be reported to a health care provider. Women in their 20s and 30s should have a clinical breast exam (CBE) by a health care provider as part of a regular health exam every 1 to 3 years. After age 40, women should have a CBE every year. Starting at age 40, women should consider having a mammogram (breast X-ray test) every year. Women who have a family history of breast cancer should talk to their health care provider about genetic screening. Women at a high risk of breast cancer should talk to their health care providers about having an MRI and a mammogram every year.  Breast cancer gene (BRCA)-related cancer risk assessment is recommended for women who have family members with BRCA-related cancers. BRCA-related cancers include breast, ovarian, tubal, and peritoneal cancers. Having family members with these cancers may be associated with an increased risk for harmful changes (mutations) in the breast cancer genes BRCA1 and  BRCA2. Results of the assessment will determine the need for genetic counseling and BRCA1 and BRCA2 testing.  Your health care provider may recommend that you be screened regularly for cancer of the pelvic organs (ovaries, uterus, and vagina). This screening involves a pelvic examination, including checking for microscopic changes to the surface of your cervix (Pap test). You may be encouraged to have this screening done every 3 years, beginning at age 21.  For women ages 30-65, health care providers may recommend pelvic exams and Pap testing every 3 years, or they may recommend the Pap and pelvic exam, combined with testing for human papilloma virus (HPV), every 5 years. Some types of HPV increase your risk of cervical cancer. Testing for HPV may also be done on women of any age with unclear Pap test results.  Other health care providers may not recommend any screening for nonpregnant women who are considered low risk for pelvic cancer and who do not have symptoms. Ask your health care provider if a screening pelvic exam is right for   you.  If you have had past treatment for cervical cancer or a condition that could lead to cancer, you need Pap tests and screening for cancer for at least 20 years after your treatment. If Pap tests have been discontinued, your risk factors (such as having a new sexual partner) need to be reassessed to determine if screening should resume. Some women have medical problems that increase the chance of getting cervical cancer. In these cases, your health care provider may recommend more frequent screening and Pap tests.  Colorectal cancer can be detected and often prevented. Most routine colorectal cancer screening begins at the age of 50 years and continues through age 75 years. However, your health care provider may recommend screening at an earlier age if you have risk factors for colon cancer. On a yearly basis, your health care provider may provide home test kits to check  for hidden blood in the stool. Use of a small camera at the end of a tube, to directly examine the colon (sigmoidoscopy or colonoscopy), can detect the earliest forms of colorectal cancer. Talk to your health care provider about this at age 50, when routine screening begins. Direct exam of the colon should be repeated every 5-10 years through age 75 years, unless early forms of precancerous polyps or small growths are found.  People who are at an increased risk for hepatitis B should be screened for this virus. You are considered at high risk for hepatitis B if:  You were born in a country where hepatitis B occurs often. Talk with your health care provider about which countries are considered high risk.  Your parents were born in a high-risk country and you have not received a shot to protect against hepatitis B (hepatitis B vaccine).  You have HIV or AIDS.  You use needles to inject street drugs.  You live with, or have sex with, someone who has hepatitis B.  You get hemodialysis treatment.  You take certain medicines for conditions like cancer, organ transplantation, and autoimmune conditions.  Hepatitis C blood testing is recommended for all people born from 1945 through 1965 and any individual with known risks for hepatitis C.  Practice safe sex. Use condoms and avoid high-risk sexual practices to reduce the spread of sexually transmitted infections (STIs). STIs include gonorrhea, chlamydia, syphilis, trichomonas, herpes, HPV, and human immunodeficiency virus (HIV). Herpes, HIV, and HPV are viral illnesses that have no cure. They can result in disability, cancer, and death.  You should be screened for sexually transmitted illnesses (STIs) including gonorrhea and chlamydia if:  You are sexually active and are younger than 24 years.  You are older than 24 years and your health care provider tells you that you are at risk for this type of infection.  Your sexual activity has changed  since you were last screened and you are at an increased risk for chlamydia or gonorrhea. Ask your health care provider if you are at risk.  If you are at risk of being infected with HIV, it is recommended that you take a prescription medicine daily to prevent HIV infection. This is called preexposure prophylaxis (PrEP). You are considered at risk if:  You are sexually active and do not regularly use condoms or know the HIV status of your partner(s).  You take drugs by injection.  You are sexually active with a partner who has HIV.  Talk with your health care provider about whether you are at high risk of being infected with HIV. If   you choose to begin PrEP, you should first be tested for HIV. You should then be tested every 3 months for as long as you are taking PrEP.  Osteoporosis is a disease in which the bones lose minerals and strength with aging. This can result in serious bone fractures or breaks. The risk of osteoporosis can be identified using a bone density scan. Women ages 67 years and over and women at risk for fractures or osteoporosis should discuss screening with their health care providers. Ask your health care provider whether you should take a calcium supplement or vitamin D to reduce the rate of osteoporosis.  Menopause can be associated with physical symptoms and risks. Hormone replacement therapy is available to decrease symptoms and risks. You should talk to your health care provider about whether hormone replacement therapy is right for you.  Use sunscreen. Apply sunscreen liberally and repeatedly throughout the day. You should seek shade when your shadow is shorter than you. Protect yourself by wearing long sleeves, pants, a wide-brimmed hat, and sunglasses year round, whenever you are outdoors.  Once a month, do a whole body skin exam, using a mirror to look at the skin on your back. Tell your health care provider of new moles, moles that have irregular borders, moles that  are larger than a pencil eraser, or moles that have changed in shape or color.  Stay current with required vaccines (immunizations).  Influenza vaccine. All adults should be immunized every year.  Tetanus, diphtheria, and acellular pertussis (Td, Tdap) vaccine. Pregnant women should receive 1 dose of Tdap vaccine during each pregnancy. The dose should be obtained regardless of the length of time since the last dose. Immunization is preferred during the 27th-36th week of gestation. An adult who has not previously received Tdap or who does not know her vaccine status should receive 1 dose of Tdap. This initial dose should be followed by tetanus and diphtheria toxoids (Td) booster doses every 10 years. Adults with an unknown or incomplete history of completing a 3-dose immunization series with Td-containing vaccines should begin or complete a primary immunization series including a Tdap dose. Adults should receive a Td booster every 10 years.  Varicella vaccine. An adult without evidence of immunity to varicella should receive 2 doses or a second dose if she has previously received 1 dose. Pregnant females who do not have evidence of immunity should receive the first dose after pregnancy. This first dose should be obtained before leaving the health care facility. The second dose should be obtained 4-8 weeks after the first dose.  Human papillomavirus (HPV) vaccine. Females aged 13-26 years who have not received the vaccine previously should obtain the 3-dose series. The vaccine is not recommended for use in pregnant females. However, pregnancy testing is not needed before receiving a dose. If a female is found to be pregnant after receiving a dose, no treatment is needed. In that case, the remaining doses should be delayed until after the pregnancy. Immunization is recommended for any person with an immunocompromised condition through the age of 61 years if she did not get any or all doses earlier. During the  3-dose series, the second dose should be obtained 4-8 weeks after the first dose. The third dose should be obtained 24 weeks after the first dose and 16 weeks after the second dose.  Zoster vaccine. One dose is recommended for adults aged 30 years or older unless certain conditions are present.  Measles, mumps, and rubella (MMR) vaccine. Adults born  before 1957 generally are considered immune to measles and mumps. Adults born in 1957 or later should have 1 or more doses of MMR vaccine unless there is a contraindication to the vaccine or there is laboratory evidence of immunity to each of the three diseases. A routine second dose of MMR vaccine should be obtained at least 28 days after the first dose for students attending postsecondary schools, health care workers, or international travelers. People who received inactivated measles vaccine or an unknown type of measles vaccine during 1963-1967 should receive 2 doses of MMR vaccine. People who received inactivated mumps vaccine or an unknown type of mumps vaccine before 1979 and are at high risk for mumps infection should consider immunization with 2 doses of MMR vaccine. For females of childbearing age, rubella immunity should be determined. If there is no evidence of immunity, females who are not pregnant should be vaccinated. If there is no evidence of immunity, females who are pregnant should delay immunization until after pregnancy. Unvaccinated health care workers born before 1957 who lack laboratory evidence of measles, mumps, or rubella immunity or laboratory confirmation of disease should consider measles and mumps immunization with 2 doses of MMR vaccine or rubella immunization with 1 dose of MMR vaccine.  Pneumococcal 13-valent conjugate (PCV13) vaccine. When indicated, a person who is uncertain of his immunization history and has no record of immunization should receive the PCV13 vaccine. All adults 65 years of age and older should receive this  vaccine. An adult aged 19 years or older who has certain medical conditions and has not been previously immunized should receive 1 dose of PCV13 vaccine. This PCV13 should be followed with a dose of pneumococcal polysaccharide (PPSV23) vaccine. Adults who are at high risk for pneumococcal disease should obtain the PPSV23 vaccine at least 8 weeks after the dose of PCV13 vaccine. Adults older than 25 years of age who have normal immune system function should obtain the PPSV23 vaccine dose at least 1 year after the dose of PCV13 vaccine.  Pneumococcal polysaccharide (PPSV23) vaccine. When PCV13 is also indicated, PCV13 should be obtained first. All adults aged 65 years and older should be immunized. An adult younger than age 65 years who has certain medical conditions should be immunized. Any person who resides in a nursing home or long-term care facility should be immunized. An adult smoker should be immunized. People with an immunocompromised condition and certain other conditions should receive both PCV13 and PPSV23 vaccines. People with human immunodeficiency virus (HIV) infection should be immunized as soon as possible after diagnosis. Immunization during chemotherapy or radiation therapy should be avoided. Routine use of PPSV23 vaccine is not recommended for American Indians, [place] Natives, or people younger than 65 years unless there are medical conditions that require PPSV23 vaccine. When indicated, people who have unknown immunization and have no record of immunization should receive PPSV23 vaccine. One-time revaccination 5 years after the first dose of PPSV23 is recommended for people aged 19-64 years who have chronic kidney failure, nephrotic syndrome, asplenia, or immunocompromised conditions. People who received 1-2 doses of PPSV23 before age 65 years should receive another dose of PPSV23 vaccine at age 65 years or later if at least 5 years have passed since the previous dose. Doses of PPSV23 are not  needed for people immunized with PPSV23 at or after age 65 years.  Meningococcal vaccine. Adults with asplenia or persistent complement component deficiencies should receive 2 doses of quadrivalent meningococcal conjugate (MenACWY-D) vaccine. The doses should be obtained   at least 2 months apart. Microbiologists working with certain meningococcal bacteria, [place] recruits, people at risk during an outbreak, and people who travel to or live in countries with a high rate of meningitis should be immunized. A first-year college student up through age 34 years who is living in a residence hall should receive a dose if she did not receive a dose on or after her 16th birthday. Adults who have certain high-risk conditions should receive one or more doses of vaccine.  Hepatitis A vaccine. Adults who wish to be protected from this disease, have certain high-risk conditions, work with hepatitis A-infected animals, work in hepatitis A research labs, or travel to or work in countries with a high rate of hepatitis A should be immunized. Adults who were previously unvaccinated and who anticipate close contact with an international adoptee during the first 60 days after arrival in the [place] from a country with a high rate of hepatitis A should be immunized.  Hepatitis B vaccine. Adults who wish to be protected from this disease, have certain high-risk conditions, may be exposed to blood or other infectious body fluids, are household contacts or sex partners of hepatitis B positive people, are clients or workers in certain care facilities, or travel to or work in countries with a high rate of hepatitis B should be immunized.  Haemophilus influenzae type b (Hib) vaccine. A previously unvaccinated person with asplenia or sickle cell disease or having a scheduled splenectomy should receive 1 dose of Hib vaccine. Regardless of previous immunization, a recipient of a hematopoietic stem cell transplant should receive a  3-dose series 6-12 months after her successful transplant. Hib vaccine is not recommended for adults with HIV infection. Preventive Services / Frequency Ages 35 to 4 years  Blood pressure check.** / Every 3-5 years.  Lipid and cholesterol check.** / Every 5 years beginning at age 60.  Clinical breast exam.** / Every 3 years for women in their 71s and 10s.  BRCA-related cancer risk assessment.** / For women who have family members with a BRCA-related cancer (breast, ovarian, tubal, or peritoneal cancers).  Pap test.** / Every 2 years from ages 76 through 26. Every 3 years starting at age 61 through age 76 or 93 with a history of 3 consecutive normal Pap tests.  HPV screening.** / Every 3 years from ages 37 through ages 60 to 51 with a history of 3 consecutive normal Pap tests.  Hepatitis C blood test.** / For any individual with known risks for hepatitis C.  Skin self-exam. / Monthly.  Influenza vaccine. / Every year.  Tetanus, diphtheria, and acellular pertussis (Tdap, Td) vaccine.** / Consult your health care provider. Pregnant women should receive 1 dose of Tdap vaccine during each pregnancy. 1 dose of Td every 10 years.  Varicella vaccine.** / Consult your health care provider. Pregnant females who do not have evidence of immunity should receive the first dose after pregnancy.  HPV vaccine. / 3 doses over 6 months, if 93 and younger. The vaccine is not recommended for use in pregnant females. However, pregnancy testing is not needed before receiving a dose.  Measles, mumps, rubella (MMR) vaccine.** / You need at least 1 dose of MMR if you were born in 1957 or later. You may also need a 2nd dose. For females of childbearing age, rubella immunity should be determined. If there is no evidence of immunity, females who are not pregnant should be vaccinated. If there is no evidence of immunity, females who are  pregnant should delay immunization until after pregnancy.  Pneumococcal  13-valent conjugate (PCV13) vaccine.** / Consult your health care provider.  Pneumococcal polysaccharide (PPSV23) vaccine.** / 1 to 2 doses if you smoke cigarettes or if you have certain conditions.  Meningococcal vaccine.** / 1 dose if you are age 68 to 8 years and a Market researcher living in a residence hall, or have one of several medical conditions, you need to get vaccinated against meningococcal disease. You may also need additional booster doses.  Hepatitis A vaccine.** / Consult your health care provider.  Hepatitis B vaccine.** / Consult your health care provider.  Haemophilus influenzae type b (Hib) vaccine.** / Consult your health care provider. Ages 7 to 53 years  Blood pressure check.** / Every year.  Lipid and cholesterol check.** / Every 5 years beginning at age 25 years.  Lung cancer screening. / Every year if you are aged 11-80 years and have a 30-pack-year history of smoking and currently smoke or have quit within the past 15 years. Yearly screening is stopped once you have quit smoking for at least 15 years or develop a health problem that would prevent you from having lung cancer treatment.  Clinical breast exam.** / Every year after age 48 years.  BRCA-related cancer risk assessment.** / For women who have family members with a BRCA-related cancer (breast, ovarian, tubal, or peritoneal cancers).  Mammogram.** / Every year beginning at age 41 years and continuing for as long as you are in good health. Consult with your health care provider.  Pap test.** / Every 3 years starting at age 65 years through age 37 or 70 years with a history of 3 consecutive normal Pap tests.  HPV screening.** / Every 3 years from ages 72 years through ages 60 to 40 years with a history of 3 consecutive normal Pap tests.  Fecal occult blood test (FOBT) of stool. / Every year beginning at age 21 years and continuing until age 5 years. You may not need to do this test if you get  a colonoscopy every 10 years.  Flexible sigmoidoscopy or colonoscopy.** / Every 5 years for a flexible sigmoidoscopy or every 10 years for a colonoscopy beginning at age 35 years and continuing until age 48 years.  Hepatitis C blood test.** / For all people born from [birth year] through 1965 and any individual with known risks for hepatitis C.  Skin self-exam. / Monthly.  Influenza vaccine. / Every year.  Tetanus, diphtheria, and acellular pertussis (Tdap/Td) vaccine.** / Consult your health care provider. Pregnant women should receive 1 dose of Tdap vaccine during each pregnancy. 1 dose of Td every 10 years.  Varicella vaccine.** / Consult your health care provider. Pregnant females who do not have evidence of immunity should receive the first dose after pregnancy.  Zoster vaccine.** / 1 dose for adults aged 30 years or older.  Measles, mumps, rubella (MMR) vaccine.** / You need at least 1 dose of MMR if you were born in 1957 or later. You may also need a second dose. For females of childbearing age, rubella immunity should be determined. If there is no evidence of immunity, females who are not pregnant should be vaccinated. If there is no evidence of immunity, females who are pregnant should delay immunization until after pregnancy.  Pneumococcal 13-valent conjugate (PCV13) vaccine.** / Consult your health care provider.  Pneumococcal polysaccharide (PPSV23) vaccine.** / 1 to 2 doses if you smoke cigarettes or if you have certain conditions.  Meningococcal vaccine.** /  Consult your health care provider.  Hepatitis A vaccine.** / Consult your health care provider.  Hepatitis B vaccine.** / Consult your health care provider.  Haemophilus influenzae type b (Hib) vaccine.** / Consult your health care provider. Ages 64 years and over  Blood pressure check.** / Every year.  Lipid and cholesterol check.** / Every 5 years beginning at age 23 years.  Lung cancer screening. / Every year if you  are aged 16-80 years and have a 30-pack-year history of smoking and currently smoke or have quit within the past 15 years. Yearly screening is stopped once you have quit smoking for at least 15 years or develop a health problem that would prevent you from having lung cancer treatment.  Clinical breast exam.** / Every year after age 74 years.  BRCA-related cancer risk assessment.** / For women who have family members with a BRCA-related cancer (breast, ovarian, tubal, or peritoneal cancers).  Mammogram.** / Every year beginning at age 44 years and continuing for as long as you are in good health. Consult with your health care provider.  Pap test.** / Every 3 years starting at age 58 years through age 22 or 39 years with 3 consecutive normal Pap tests. Testing can be stopped between 65 and 70 years with 3 consecutive normal Pap tests and no abnormal Pap or HPV tests in the past 10 years.  HPV screening.** / Every 3 years from ages 64 years through ages 70 or 61 years with a history of 3 consecutive normal Pap tests. Testing can be stopped between 65 and 70 years with 3 consecutive normal Pap tests and no abnormal Pap or HPV tests in the past 10 years.  Fecal occult blood test (FOBT) of stool. / Every year beginning at age 40 years and continuing until age 27 years. You may not need to do this test if you get a colonoscopy every 10 years.  Flexible sigmoidoscopy or colonoscopy.** / Every 5 years for a flexible sigmoidoscopy or every 10 years for a colonoscopy beginning at age 7 years and continuing until age 32 years.  Hepatitis C blood test.** / For all people born from [birth year] through 1965 and any individual with known risks for hepatitis C.  Osteoporosis screening.** / A one-time screening for women ages 30 years and over and women at risk for fractures or osteoporosis.  Skin self-exam. / Monthly.  Influenza vaccine. / Every year.  Tetanus, diphtheria, and acellular pertussis (Tdap/Td)  vaccine.** / 1 dose of Td every 10 years.  Varicella vaccine.** / Consult your health care provider.  Zoster vaccine.** / 1 dose for adults aged 35 years or older.  Pneumococcal 13-valent conjugate (PCV13) vaccine.** / Consult your health care provider.  Pneumococcal polysaccharide (PPSV23) vaccine.** / 1 dose for all adults aged 46 years and older.  Meningococcal vaccine.** / Consult your health care provider.  Hepatitis A vaccine.** / Consult your health care provider.  Hepatitis B vaccine.** / Consult your health care provider.  Haemophilus influenzae type b (Hib) vaccine.** / Consult your health care provider. ** Family history and personal history of risk and conditions may change your health care provider's recommendations.   This information is not intended to replace advice given to you by your health care provider. Make sure you discuss any questions you have with your health care provider.   Document Released: 12/09/2001 Document Revised: 11/03/2014 Document Reviewed: 03/10/2011 Elsevier Interactive Patient Education [employer].

## 2016-05-09 NOTE — Progress Notes (Signed)
Pre visit review using our clinic review tool, if applicable. No additional management support is needed unless otherwise documented below in the visit note. 

## 2016-05-12 LAB — TB SKIN TEST
Induration: 0 mm
TB SKIN TEST: NEGATIVE

## 2016-05-20 ENCOUNTER — Telehealth: Payer: Self-pay | Admitting: Family Medicine

## 2016-05-20 NOTE — Telephone Encounter (Signed)
Pt seen on 05/09/2016 for CPE, form completed as much as possible and forwarded to PCP for completion/review/signature.

## 2016-05-20 NOTE — Telephone Encounter (Signed)
Pt dropped off a physical form for Dr. Laury Axon to fill out, pt states she starts her new job on Tuesday 8/1 and need the form to take with her. Please call when ready for pick up. Documents placed in tray at front desk

## 2016-05-23 NOTE — Telephone Encounter (Signed)
Called and spoke with the pt and informed her that the physical form she brought in has been completed,signed, and ready for her to pick up.  Pt verbalized understanding and stated that she will come by today before we close to pick it up.  Physical form copied and really to be scanned.//AB/CMA

## 2016-05-26 LAB — HM DIABETES EYE EXAM

## 2016-06-26 ENCOUNTER — Ambulatory Visit: Payer: BLUE CROSS/BLUE SHIELD | Admitting: Internal Medicine

## 2016-08-06 ENCOUNTER — Other Ambulatory Visit: Payer: Self-pay | Admitting: "Endocrinology

## 2016-08-07 ENCOUNTER — Other Ambulatory Visit: Payer: Self-pay

## 2016-08-07 ENCOUNTER — Telehealth: Payer: Self-pay | Admitting: Internal Medicine

## 2016-08-07 MED ORDER — INSULIN ASPART 100 UNIT/ML FLEXPEN
PEN_INJECTOR | SUBCUTANEOUS | 6 refills | Status: DC
Start: 2016-08-07 — End: 2016-08-07

## 2016-08-07 MED ORDER — INSULIN ASPART 100 UNIT/ML FLEXPEN: PEN_INJECTOR | SUBCUTANEOUS | 6 refills | Status: DC

## 2016-08-07 NOTE — Telephone Encounter (Signed)
Pt [facility] her Novolog [facility] today if possible.

## 2016-09-03 ENCOUNTER — Ambulatory Visit: Payer: BLUE CROSS/BLUE SHIELD | Admitting: Internal Medicine

## 2016-09-04 ENCOUNTER — Other Ambulatory Visit: Payer: Self-pay | Admitting: Internal Medicine

## 2016-09-04 NOTE — Telephone Encounter (Signed)
Patient need refill of Insulin Glargine (LANTUS SOLOSTAR) 100 UNIT/ML Solostar Pen,  Need it today going out of town  [facility] [address] - , [place] - [address] [phone] (Phone) [phone] (Fax)

## 2016-10-13 ENCOUNTER — Telehealth: Payer: Self-pay | Admitting: Internal Medicine

## 2016-10-13 MED ORDER — GLUCAGON (RDNA) 1 MG IJ KIT: 1.0000 mg | PACK | Freq: Once | INTRAMUSCULAR | 4 refills | Status: DC | PRN

## 2016-10-13 NOTE — Telephone Encounter (Signed)
Done

## 2016-10-13 NOTE — Telephone Encounter (Signed)
Patient need a refill of glucagon 1 MG injection, x2  Send to   [facility] [address] - [place], [place] - [address] [phone] (Phone) [phone] (Fax)   Patient also need a new meter, one touch verio.

## 2016-10-30 ENCOUNTER — Ambulatory Visit (INDEPENDENT_AMBULATORY_CARE_PROVIDER_SITE_OTHER): Payer: BLUE CROSS/BLUE SHIELD | Admitting: Internal Medicine

## 2016-10-30 ENCOUNTER — Encounter: Payer: Self-pay | Admitting: Internal Medicine

## 2016-10-30 VITALS — BP 110/72 | HR 96 | Ht 63.0 in | Wt 186.0 lb

## 2016-10-30 DIAGNOSIS — E109 Type 1 diabetes mellitus without complications: Secondary | ICD-10-CM | POA: Diagnosis not present

## 2016-10-30 DIAGNOSIS — E038 Other specified hypothyroidism: Secondary | ICD-10-CM | POA: Diagnosis not present

## 2016-10-30 DIAGNOSIS — E063 Autoimmune thyroiditis: Secondary | ICD-10-CM

## 2016-10-30 LAB — POCT GLYCOSYLATED HEMOGLOBIN (HGB A1C): Hemoglobin A1C: 10.7

## 2016-10-30 MED ORDER — LEVOTHYROXINE SODIUM 25 MCG PO TABS: 25.0000 ug | ORAL_TABLET | Freq: Every day | ORAL | 3 refills | Status: DC

## 2016-10-30 NOTE — Addendum Note (Signed)
Addended by: Darene LamerHOMPSON, Kieana Livesay T on: 10/30/2016 10:30 AM   Modules accepted: Orders

## 2016-10-30 NOTE — Patient Instructions (Signed)
Please continue: - Lantus 35 units at bedtime.  Change NovoLog - inject 15 min before a meal: - ICR: 10 - target: 120 - ISF: 50  Please continue Synthroid daily.  Take the thyroid hormone every day, with water, at least 30 minutes before breakfast, separated by at least 4 hours from: - acid reflux medications - calcium - iron - multivitamins  Please return in 1.5 months with your sugar log.

## 2016-10-30 NOTE — Progress Notes (Signed)
Patient ID: Traci Rivas, female   DOB: [birth year], 26 y.o.   MRN: [id number]  HPI: Traci Rivas is a 26 y.o.-year-old female, initially referred by her Maryelizabeth Rowan, NP [facility]), returning for follow-up for DM1, diagnosed in 2010 (age 4), uncontrolled, without complications and Hashimoto's hypothyroidism. Last visit 7.5 months ago. She did not return in 1.5 months, as advised.  Last hemoglobin A1c was: Lab Results  Component Value Date   HGBA1C 10.5 03/13/2016   HGBA1C 9.0 10/11/2015   HGBA1C 9.2 (H) 10/24/2014   Pt was on: - Lantus 40 >> 35 units at bedtime - Novolog SSI - after meals (!) - ICR 15, target 150, ISF 50   At last visit, we changed to: - Lantus 35 units at bedtime. - NovoLog - inject 15 min before a meal: - ICR: 15 >> 10 - target: 150 >> 120 - ISF: 50  Pt checks her sugars 3x a day and they are (no log, no meter - meter died 1 week ago): - am: 80-150 >> 150-200 - 2h after b'fast: n/c - before lunch: 150-200 >> 200s - 2h after lunch: n/c - before dinner: 200s >> 180-200s - 2h after dinner: n/c - goes to the gym 8-10 pm  - bedtime: 150-200 - nighttime: 50s-70s  - resolved after decreased Lantus >> n/c + lows. Lowest sugar was 52 (overbolused for lunch); she has hypoglycemia awareness at 80. No previous hypoglycemia admission.  Highest sugar was 500 after running out of insulin. No previous DKA admissions.    Pt's meals are: - Breakfast: Banana and shake or smoothie - Lunch: Quinoa + chicken or salad - Dinner: Shake + fruit or salad - Snacks: 1 in a.m. and 1 in p.m. Exercise: Cardio and weights 3-5 times a week  - no CKD, last BUN/creatinine:  Lab Results  Component Value Date   BUN 9 03/13/2016   CREATININE 0.65 03/13/2016  Was on Lisinopril >> stopped. Lab Results  Component Value Date   MICRALBCREAT 0.9 03/13/2016   MICRALBCREAT 2.5 05/02/2013   MICRALBCREAT 2.8 10/11/2012   - last set of lipids: Lab Results  Component Value Date   CHOL  159 03/13/2016   HDL 44.70 03/13/2016   LDLCALC 94 03/13/2016   TRIG 101.0 03/13/2016   CHOLHDL 4 03/13/2016   - last eye exam was: Abstract on 06/02/2016  Component Date Value Ref Range Status  . HM Diabetic Eye Exam 05/26/2016 No Retinopathy  No Retinopathy   - no numbness and tingling in her feet.  Also has Hashimoto's hypothyroidism: - on DAW LT4 1.5 tabs of 25 mcg daily >> ran out 1 mo ago! - Last TSH: Lab Results  Component Value Date   TSH 3.01 03/13/2016   Pt has FH of DM1 in sister.  ROS: Constitutional: no weight gain/loss, no fatigue, no subjective hyperthermia/hypothermia Eyes: no blurry vision, no xerophthalmia ENT: no sore throat, no nodules palpated in throat, no dysphagia/odynophagia, no hoarseness Cardiovascular: no CP/SOB/palpitations/leg swelling Respiratory: no cough/SOB Gastrointestinal: no N/V/D/C/heartburn Musculoskeletal: no muscle/joint aches Skin: no rashes Neurological: no tremors/numbness/tingling/dizziness  I reviewed pt's medications, allergies, PMH, social hx, family hx, and changes were documented in the history of present illness. Otherwise, unchanged from my initial visit note.  Past Medical History:  Diagnosis Date  . Diabetes mellitus    type 1  . Dysmenorrhea   . Hypoglycemia associated with diabetes (HCC)   . Hypothyroidism, acquired, autoimmune   . Thyroiditis, autoimmune    No past surgical  history on file. Social History   Social History  . Marital Status: Single    Spouse Name: N/A  . Number of Children: 0   Occupational History  . Not on file.   Social History Main Topics  . Smoking status: Never Smoker   . Smokeless tobacco: Never Used  . Alcohol Use: No  . Drug Use: No  . Sexual Activity: Not Currently    Birth Control/ Protection: Pill   Current Outpatient Prescriptions on File Prior to Visit  Medication Sig Dispense Refill  . glucagon 1 MG injection Inject 1 mg into the skin once as needed. Follow package  directions for low blood sugar. 2 each 4  . insulin aspart (NOVOLOG FLEXPEN) 100 UNIT/ML FlexPen INJECT UP TO 50 UNITS SUBCUTANEOUSLY ONCE DAILY 15 mL 6  . LANTUS SOLOSTAR 100 UNIT/ML Solostar Pen INJECT 35 UNITS AT BEDTIME 15 pen 2  . levocetirizine (XYZAL) 5 MG tablet Take 1 tablet (5 mg total) by mouth every evening. (Patient not taking: Reported on 10/30/2016) 30 tablet 5  . SYNTHROID 25 MCG tablet Take 1.5 Synthroid 25 mcg tablets each morning. (Patient not taking: Reported on 10/30/2016) 90 tablet 3   No current facility-administered medications on file prior to visit.    Allergies  Allergen Reactions  . Amoxicillin   . Penicillins    Family History  Problem Relation Age of Onset  . Heart attack Paternal Grandfather   . Heart disease Paternal Grandfather   . Diabetes Sister   . Hypothyroidism Sister   . Cancer Paternal Grandmother   . Diabetes Father    PE: BP 110/72 (BP Location: Right Arm, Patient Position: Sitting)   Pulse 96   Ht 5\' 3"  (1.6 m)   Wt 186 lb (84.4 kg)   LMP 10/12/2016   SpO2 98%   BMI 32.95 kg/m  Wt Readings from Last 3 Encounters:  10/30/16 186 lb (84.4 kg)  05/09/16 183 lb 6.4 oz (83.2 kg)  03/13/16 189 lb (85.7 kg)   Constitutional: overweight, in NAD Eyes: PERRLA, EOMI, no exophthalmos ENT: moist mucous membranes, no thyromegaly, no cervical lymphadenopathy Cardiovascular: tachycardia, RR, No MRG Respiratory: CTA B Gastrointestinal: abdomen soft, NT, ND, BS+ Musculoskeletal: no deformities, strength intact in all 4 Skin: moist, warm, no rashes Neurological: no tremor with outstretched hands, DTR normal in all 4  ASSESSMENT: 1. DM1, uncontrolled, without complications  2. Hashimoto's hypothyroidism  PLAN:  1. Patient with long-standing, uncontrolled DM1, on basal-bolus insulin therapy, returning after a longer absence. Her HbA1c today is higher (10.7%)! - Patient has had poor diabetes control since diagnosis, as she is noncompliant with  blood sugar checks and insulin doses. She is still missing doses and also taking insulin after rather than before meals. We discussed about the need to check sugars 10-15 min before meals and inject insulin then. Also, to bring her meter or write sugars down at next visit. - We discussed about continuing her insulin regimen, but working on compliance:  Patient Instructions  Please continue: - Lantus 35 units at bedtime.  Change NovoLog - inject 15 min before a meal: - ICR: 10 - target: 120 - ISF: 50  Please continue Synthroid daily.  Take the thyroid hormone every day, with water, at least 30 minutes before breakfast, separated by at least 4 hours from: - acid reflux medications - calcium - iron - multivitamins  Please return in 1.5 months with your sugar log.    2. Hashimoto's hypothyroidism - She is on  1.5 tab 25 g of Synthroid daily >> she ran out 1 mo ago >> advised her that she cannot run out of this me. She c/o price of Synthroid >> sent a new Rx to her pharm for generic LT4 - We discussed about taking the medication correctly: every day, with water, at least 30 minutes before breakfast, separated by at least 4 hours from: - acid reflux medications - calcium - iron - multivitamins She is taking it correctly, but needs to take it every day - Reviewed latest TSH level, which was normal in 02/2016  Carlus Pavlovristina Hrishikesh Hoeg, MD PhD [facility]

## 2016-11-06 ENCOUNTER — Other Ambulatory Visit: Payer: Self-pay

## 2016-11-06 ENCOUNTER — Telehealth: Payer: Self-pay | Admitting: Internal Medicine

## 2016-11-06 MED ORDER — GLUCOSE BLOOD VI STRP: ORAL_STRIP | 5 refills | Status: DC

## 2016-11-06 MED ORDER — BAYER CONTOUR NEXT MONITOR W/DEVICE KIT: PACK | 0 refills | Status: AC

## 2016-11-06 MED ORDER — BAYER MICROLET LANCETS MISC: 5 refills | Status: AC

## 2016-11-06 NOTE — Telephone Encounter (Signed)
rx sent

## 2016-11-06 NOTE — Telephone Encounter (Signed)
[employer] contour meter and supplies is needed [facility]

## 2016-12-03 ENCOUNTER — Encounter: Payer: BLUE CROSS/BLUE SHIELD | Admitting: Women's Health

## 2016-12-26 ENCOUNTER — Encounter: Payer: BLUE CROSS/BLUE SHIELD | Admitting: Women's Health

## 2017-01-02 ENCOUNTER — Ambulatory Visit: Payer: BLUE CROSS/BLUE SHIELD | Admitting: Internal Medicine

## 2017-01-25 ENCOUNTER — Other Ambulatory Visit: Payer: Self-pay | Admitting: Internal Medicine

## 2017-03-11 ENCOUNTER — Encounter: Payer: Self-pay | Admitting: Gynecology

## 2017-03-19 ENCOUNTER — Ambulatory Visit: Payer: BLUE CROSS/BLUE SHIELD | Admitting: Internal Medicine

## 2017-05-04 ENCOUNTER — Other Ambulatory Visit: Payer: Self-pay | Admitting: Internal Medicine

## 2017-05-04 NOTE — Telephone Encounter (Signed)
**  Remind patient they can make refill requests via MyChart**  Medication refill request (Name & Dosage):  insulin aspart (NOVOLOG FLEXPEN) 100 UNIT/ML FlexPen    LANTUS SOLOSTAR 100 UNIT/ML Solostar Pen     Preferred pharmacy (Name & Address):   [facility] [address] - [place], [place] - [address] [phone] (Phone) [phone] (Fax)     Other comments (if applicable):   Patient needs refill on rx. [facility] pharmacy or patient to advise. The soonest appt is not until patient is scheduled.

## 2017-05-27 ENCOUNTER — Telehealth: Payer: Self-pay | Admitting: Family Medicine

## 2017-05-27 DIAGNOSIS — Z111 Encounter for screening for respiratory tuberculosis: Secondary | ICD-10-CM

## 2017-05-27 NOTE — Telephone Encounter (Signed)
Patient needs for work.  She usually comes in every 2 years for CPEs for work.  Blood test ordered.

## 2017-05-27 NOTE — Telephone Encounter (Signed)
Left message on machine to call back  

## 2017-05-27 NOTE — Telephone Encounter (Signed)
°  Relation to WU:JWJXpt:self Call back number:[phone]  Reason for call:   Patient requesting TB orders for employment,please advise

## 2017-05-27 NOTE — Telephone Encounter (Signed)
Need to know if she would like blood work or skin test.

## 2017-05-27 NOTE — Telephone Encounter (Signed)
Pt returned call.   She said that she will be in a apt at 2:30 incase she cant answer. She would like for assistant to leave a detailed message.   Also, she would like to come in this week if possible.

## 2017-05-29 ENCOUNTER — Other Ambulatory Visit (INDEPENDENT_AMBULATORY_CARE_PROVIDER_SITE_OTHER): Payer: PRIVATE HEALTH INSURANCE

## 2017-05-29 DIAGNOSIS — Z111 Encounter for screening for respiratory tuberculosis: Secondary | ICD-10-CM | POA: Diagnosis not present

## 2017-05-31 LAB — QUANTIFERON TB GOLD ASSAY (BLOOD)
Interferon Gamma Release Assay: NEGATIVE
QUANTIFERON NIL VALUE: 0.05 [IU]/mL
Quantiferon Tb Ag Minus Nil Value: <0 IU/mL

## 2017-06-07 ENCOUNTER — Other Ambulatory Visit: Payer: Self-pay | Admitting: Internal Medicine

## 2017-06-25 ENCOUNTER — Encounter: Payer: Self-pay | Admitting: Internal Medicine

## 2017-06-25 ENCOUNTER — Ambulatory Visit (INDEPENDENT_AMBULATORY_CARE_PROVIDER_SITE_OTHER): Payer: PRIVATE HEALTH INSURANCE | Admitting: Internal Medicine

## 2017-06-25 VITALS — BP 102/82 | HR 111 | Ht 63.0 in | Wt 190.0 lb

## 2017-06-25 DIAGNOSIS — E109 Type 1 diabetes mellitus without complications: Secondary | ICD-10-CM | POA: Diagnosis not present

## 2017-06-25 DIAGNOSIS — E063 Autoimmune thyroiditis: Secondary | ICD-10-CM | POA: Diagnosis not present

## 2017-06-25 LAB — LIPID PANEL
CHOL/HDL RATIO: 4
Cholesterol: 174 mg/dL (ref 0–200)
HDL: 45.7 mg/dL (ref >39.00)
LDL Cholesterol: 92 mg/dL (ref 0–99)
NONHDL: 128.32
Triglycerides: 182 mg/dL — ABNORMAL HIGH (ref 0.0–149.0)
VLDL: 36.4 mg/dL (ref 0.0–40.0)

## 2017-06-25 LAB — TSH: TSH: 2.39 u[IU]/mL (ref 0.35–4.50)

## 2017-06-25 LAB — POCT GLYCOSYLATED HEMOGLOBIN (HGB A1C): HEMOGLOBIN A1C: 10

## 2017-06-25 LAB — MICROALBUMIN / CREATININE URINE RATIO
CREATININE, U: 105.7 mg/dL
Microalb Creat Ratio: 0.7 mg/g (ref 0.0–30.0)

## 2017-06-25 MED ORDER — LEVOTHYROXINE SODIUM 25 MCG PO TABS: 37.5000 ug | ORAL_TABLET | Freq: Every day | ORAL | 3 refills | Status: DC

## 2017-06-25 NOTE — Progress Notes (Signed)
Patient ID: Traci Rivas, female   DOB: [birth year], 26 y.o.   MRN: [id number]  HPI: Traci Rivas is a 26 y.o.-year-old female, initially referred by her Elon Alas, NP Harle Battiest), returning for follow-up for DM1, diagnosed in 2010 (age 29), uncontrolled, without complications and Hashimoto's hypothyroidism. Last visit 8 months ago!   Last hemoglobin A1c was: Lab Results  Component Value Date   HGBA1C 10.7 10/30/2016   HGBA1C 10.5 03/13/2016   HGBA1C 9.0 10/11/2015   Pt is on: - Lantus 36 units at bedtime. - NovoLog - inject 15 min before a meal: - ICR: 15 >> 10 (still using 15!) - 10-17 units per meal - skips b'fast usually - target: 150 >> 120 - ISF: 50  Pt checks her sugars 1-3 times a day - sugars are extremely variable: - am: 80-150 >> 150-200 >> 83, 87-244, 310 - 2h after b'fast: n/c >> 273-339 - before lunch: 150-200 >> 200s >> 481 - 2h after lunch: n/c >> 203, 272 - before dinner: 200s >> 180-200s >> 89-199 - 2h after dinner: n/c - goes to the gym 8-10 pm >> 107-303 - bedtime: 150-200 >> 83-389 - nighttime: 50s-70s  - resolved after decreased Lantus >> n/c >> 104-319 + lows. Lowest sugar was 52 (overbolused for lunch) >> 83; she has hypoglycemia awareness at 80. No previous hypoglycemia admission.  Highest sugar was 500 after running out of insulin >> 481.  No previous DKA admissions.    Pt's meals are: - Breakfast: Banana and shake or smoothie - Lunch: Quinoa + chicken or salad - Dinner: Shake + fruit or salad - Snacks: 1 in a.m. and 1 in p.m. Exercise: Cardio and weights 3-5 times a week  - No CKD, last BUN/creatinine:  Lab Results  Component Value Date   BUN 9 03/13/2016   CREATININE 0.65 03/13/2016  Was on Lisinopril >> stopped.  Normal ACRs: Lab Results  Component Value Date   MICRALBCREAT 0.9 03/13/2016   MICRALBCREAT 2.5 05/02/2013   MICRALBCREAT 2.8 10/11/2012   - No HL. Last set of lipids: Lab Results  Component Value Date   CHOL 159  03/13/2016   HDL 44.70 03/13/2016   LDLCALC 94 03/13/2016   TRIG 101.0 03/13/2016   CHOLHDL 4 03/13/2016   - last eye exam was normal: Abstract on 06/02/2016  Component Date Value Ref Range Status  . HM Diabetic Eye Exam 05/26/2016 No Retinopathy  No Retinopathy   - she denies numbness and tingling in her feet.  Also has Hashimoto's hypothyroidism: - on DAW LT4 1.5 tabs of 25 mcg daily >> 37.5 mcg daily - ran out x 1 mo before last visit! - now taking it daily  Reviewed last TSH: Lab Results  Component Value Date   TSH 3.01 03/13/2016   ROS: Constitutional: no weight gain/no weight loss, no fatigue, no subjective hyperthermia, no subjective hypothermia Eyes: no blurry vision, no xerophthalmia ENT: no sore throat, no nodules palpated in throat, no dysphagia, no odynophagia, no hoarseness Cardiovascular: no CP/no SOB/no palpitations/no leg swelling Respiratory: no cough/no SOB/no wheezing Gastrointestinal: no N/no V/no D/no C/no acid reflux Musculoskeletal: no muscle aches/no joint aches Skin: no rashes, no hair loss Neurological: no tremors/no numbness/no tingling/no dizziness  I reviewed pt's medications, allergies, PMH, social hx, family hx, and changes were documented in the history of present illness. Otherwise, unchanged from my initial visit note.  Past Medical History:  Diagnosis Date  . Diabetes mellitus    type 1  .  Dysmenorrhea   . Hypoglycemia associated with diabetes ([place])   . Hypothyroidism, acquired, autoimmune   . Thyroiditis, autoimmune    No past surgical history on file. Social History   Social History  . Marital Status: Single    Spouse Name: N/A  . Number of Children: 0   Occupational History  . Not on file.   Social History Main Topics  . Smoking status: Never Smoker   . Smokeless tobacco: Never Used  . Alcohol Use: No  . Drug Use: No  . Sexual Activity: Not Currently    Birth Control/ Protection: Pill   Current Outpatient  Prescriptions on File Prior to Visit  Medication Sig Dispense Refill  . BAYER MICROLET LANCETS lancets Use as instructed to check sugar 3 times daily. 200 each 5  . Blood Glucose Monitoring Suppl (BAYER CONTOUR NEXT MONITOR) w/Device KIT Use to check sugar 3 times daily. 1 kit 0  . glucagon 1 MG injection Inject 1 mg into the skin once as needed. Follow package directions for low blood sugar. 2 each 4  . glucose blood (BAYER CONTOUR NEXT TEST) test strip Use as instructed to check sugar 3 times daily. 200 each 5  . LANTUS SOLOSTAR 100 UNIT/ML Solostar Pen INJECT 35 UNITS UNDER THE SKIN AT BEDTIME 15 mL 0  . levocetirizine (XYZAL) 5 MG tablet Take 1 tablet (5 mg total) by mouth every evening. (Patient not taking: Reported on 10/30/2016) 30 tablet 5  . levothyroxine (SYNTHROID, LEVOTHROID) 25 MCG tablet Take 1 tablet (25 mcg total) by mouth daily before breakfast. 135 tablet 3  . NOVOLOG FLEXPEN 100 UNIT/ML FlexPen INJECT UP TO 50 UNITS SUBCUTANEOUSLY ONCE DAILY 15 mL 5   No current facility-administered medications on file prior to visit.    Allergies  Allergen Reactions  . Amoxicillin   . Penicillins    Family History  Problem Relation Age of Onset  . Heart attack Paternal Grandfather   . Heart disease Paternal Grandfather   . Diabetes Sister   . Hypothyroidism Sister   . Cancer Paternal Grandmother   . Diabetes Father    Pt has FH of DM1 in sister.  PE: BP 102/82   Pulse (!) 111   Ht '5\' 3"'  (1.6 m)   Wt 190 lb (86.2 kg)   SpO2 97%   BMI 33.66 kg/m  Wt Readings from Last 3 Encounters:  06/25/17 190 lb (86.2 kg)  10/30/16 186 lb (84.4 kg)  05/09/16 183 lb 6.4 oz (83.2 kg)   Constitutional: overweight, in NAD Eyes: PERRLA, EOMI, no exophthalmos ENT: moist mucous membranes, no thyromegaly, no cervical lymphadenopathy Cardiovascular: tachycardia, RR, No MRG Respiratory: CTA B Gastrointestinal: abdomen soft, NT, ND, BS+ Musculoskeletal: no deformities, strength intact in all  4 Skin: moist, warm, no rashes Neurological: no tremor with outstretched hands, DTR normal in all 4  ASSESSMENT: 1. DM1, uncontrolled, without complications  2. Hashimoto's hypothyroidism  PLAN:  1. Patient with long-standing, uncontrolled type 1 diabetes, on basal-bolus insulin therapy, returning after another long absence. Her HbA1c today is 10% (slightly lower than before), however, her sugars are very variable, without any pattern. I suspect that she is missing doses of insulin but also she feels that she is not very proficient at counting carbs. I will refer her to nutrition for a carb counting refresher. She did not decrease her insulin to carb ratio at last visit as advised. Will change this today. Otherwise, I would not suggest any changes in her regimen for  today, but discussed about the need to check the sugars, calculated the carbs, and start the boluses 10-15 minutes before every meal. - She tells me that her sister, who also has type 1 diabetes, we'll start the Medtronic insulin pump. She donated she would be interested in this, but she would like to see how her sister is doing with it before getting it. In the meantime, we will have her optimize her carb counting skills. - I advised her to: Patient Instructions  Please continue: - Lantus 36 units at bedtime.  Please change: - NovoLog - 15 min before a meal: - ICR: 15 >> 12 - target: 120 - ISF: 50  Please schedule an appt with Antonieta Iba with nutrition.  Please continue Levothyroxine 37.5 mcg daily.  Take the thyroid hormone every day, with water, at least 30 minutes before breakfast, separated by at least 4 hours from: - acid reflux medications - calcium - iron - multivitamins  Please stop at the lab.  Please return in 3 months with your sugar log.    - continue checking sugars at different times of the day - check 3x a day, rotating checks - advised for yearly eye exams >> she needs one - We will check her  annual labs today - Return to clinic in 3 mo with sugar log   2. Hashimoto's hypothyroidism - She is on 1.5 tab 25 g of Synthroid daily >> changed to generic LT4 at last OV 2/2 price - pt feels good on this dose. - latest thyroid labs reviewed with pt >> normal  - we discussed about taking the thyroid hormone every day, with water, >30 minutes before breakfast, separated by >4 hours from acid reflux medications, calcium, iron, multivitamins. Pt. is taking it correctly and she does not skip doses anymore. - will check thyroid tests today: TSH and fT4 - If labs are abnormal, she will need to return for repeat TFTs in 1.5 months - OTW, RTC in 3 mo  Orders Placed This Encounter  Procedures  . Microalbumin / creatinine urine ratio  . COMPLETE METABOLIC PANEL WITH GFR  . Lipid panel  . TSH  . Amb ref to Medical Nutrition Therapy-MNT  . POCT glycosylated hemoglobin (Hb A1C)   Office Visit on 06/25/2017  Component Date Value Ref Range Status  . Microalb, Ur 06/25/2017 <0.7  0.0 - 1.9 mg/dL Final  . Creatinine,U 06/25/2017 105.7  mg/dL Final  . Microalb Creat Ratio 06/25/2017 0.7  0.0 - 30.0 mg/g Final  . Sodium 06/25/2017 136  135 - 146 mmol/L Final  . Potassium 06/25/2017 4.2  3.5 - 5.3 mmol/L Final  . Chloride 06/25/2017 102  98 - 110 mmol/L Final  . CO2 06/25/2017 21  20 - 32 mmol/L Final   Comment: ** Please note change in reference range(s). **     . Glucose, Bld 06/25/2017 226* 65 - 99 mg/dL Final  . BUN 06/25/2017 13  7 - 25 mg/dL Final  . Creat 06/25/2017 0.83  0.50 - 1.10 mg/dL Final  . Total Bilirubin 06/25/2017 0.4  0.2 - 1.2 mg/dL Final  . Alkaline Phosphatase 06/25/2017 121* 33 - 115 U/L Final  . AST 06/25/2017 17  10 - 30 U/L Final  . ALT 06/25/2017 16  6 - 29 U/L Final  . Total Protein 06/25/2017 6.6  6.1 - 8.1 g/dL Final  . Albumin 06/25/2017 4.0  3.6 - 5.1 g/dL Final  . Calcium 06/25/2017 9.1  8.6 - 10.2 mg/dL  Final  . GFR, Est African American 06/25/2017 >89  >=60  mL/min Final  . GFR, Est Non African American 06/25/2017 >89  >=60 mL/min Final  . Cholesterol 06/25/2017 174  0 - 200 mg/dL Final   ATP III Classification       Desirable:  < 200 mg/dL               Borderline High:  200 - 239 mg/dL          High:  > = 240 mg/dL  . Triglycerides 06/25/2017 182.0* 0.0 - 149.0 mg/dL Final   Normal:  <150 mg/dLBorderline High:  150 - 199 mg/dL  . HDL 06/25/2017 45.70  >39.00 mg/dL Final  . VLDL 06/25/2017 36.4  0.0 - 40.0 mg/dL Final  . LDL Cholesterol 06/25/2017 92  0 - 99 mg/dL Final  . Total CHOL/HDL Ratio 06/25/2017 4   Final                  Men          Women1/2 Average Risk     3.4          3.3Average Risk          5.0          4.42X Average Risk          9.6          7.13X Average Risk          15.0          11.0                      . NonHDL 06/25/2017 128.32   Final   NOTE:  Non-HDL goal should be 30 mg/dL higher than patient's LDL goal (i.e. LDL goal of < 70 mg/dL, would have non-HDL goal of < 100 mg/dL)  . TSH 06/25/2017 2.39  0.35 - 4.50 uIU/mL Final  . Hemoglobin A1C 06/25/2017 10.0   Final   TSH normal. Glu high. Tg higher, but not fasting. A phos slightly high. Lipids OK.  Philemon Kingdom, MD PhD [facility] Endocrinology

## 2017-06-25 NOTE — Patient Instructions (Addendum)
Please continue: - Lantus 36 units at bedtime.  Please change: - NovoLog - 15 min before a meal: - ICR: 15 >> 12 - target: 120 - ISF: 50  Please schedule an appt with Oran ReinLaura Jobe with nutrition.  Please continue Levothyroxine 37.5 mcg daily.  Take the thyroid hormone every day, with water, at least 30 minutes before breakfast, separated by at least 4 hours from: - acid reflux medications - calcium - iron - multivitamins  Please stop at the lab.  Please return in 3 months with your sugar log.

## 2017-06-26 LAB — COMPLETE METABOLIC PANEL WITH GFR
ALBUMIN: 4 g/dL (ref 3.6–5.1)
ALK PHOS: 121 U/L — AB (ref 33–115)
ALT: 16 U/L (ref 6–29)
AST: 17 U/L (ref 10–30)
BILIRUBIN TOTAL: 0.4 mg/dL (ref 0.2–1.2)
BUN: 13 mg/dL (ref 7–25)
CALCIUM: 9.1 mg/dL (ref 8.6–10.2)
CO2: 21 mmol/L (ref 20–32)
Chloride: 102 mmol/L (ref 98–110)
Creat: 0.83 mg/dL (ref 0.50–1.10)
GFR, Est African American: >89 mL/min (ref >=60)
GLUCOSE: 226 mg/dL — AB (ref 65–99)
POTASSIUM: 4.2 mmol/L (ref 3.5–5.3)
SODIUM: 136 mmol/L (ref 135–146)
TOTAL PROTEIN: 6.6 g/dL (ref 6.1–8.1)

## 2017-07-01 ENCOUNTER — Encounter: Payer: PRIVATE HEALTH INSURANCE | Admitting: Nutrition

## 2017-07-31 ENCOUNTER — Other Ambulatory Visit: Payer: Self-pay | Admitting: Internal Medicine

## 2017-08-31 ENCOUNTER — Encounter: Payer: Self-pay | Admitting: Women's Health

## 2017-08-31 ENCOUNTER — Encounter: Payer: BLUE CROSS/BLUE SHIELD | Admitting: Women's Health

## 2017-08-31 ENCOUNTER — Ambulatory Visit (INDEPENDENT_AMBULATORY_CARE_PROVIDER_SITE_OTHER): Payer: PRIVATE HEALTH INSURANCE | Admitting: Women's Health

## 2017-08-31 VITALS — BP 116/74 | Ht 64.0 in | Wt 191.0 lb

## 2017-08-31 DIAGNOSIS — Z01419 Encounter for gynecological examination (general) (routine) without abnormal findings: Secondary | ICD-10-CM

## 2017-08-31 DIAGNOSIS — B373 Candidiasis of vulva and vagina: Secondary | ICD-10-CM

## 2017-08-31 DIAGNOSIS — B3731 Acute candidiasis of vulva and vagina: Secondary | ICD-10-CM

## 2017-08-31 DIAGNOSIS — Z113 Encounter for screening for infections with a predominantly sexual mode of transmission: Secondary | ICD-10-CM

## 2017-08-31 DIAGNOSIS — Z30011 Encounter for initial prescription of contraceptive pills: Secondary | ICD-10-CM

## 2017-08-31 MED ORDER — TERCONAZOLE 0.4 % VA CREA: 1.0000 | TOPICAL_CREAM | Freq: Every day | VAGINAL | 3 refills | Status: DC

## 2017-08-31 MED ORDER — NORETHINDRONE ACET-ETHINYL EST 1-20 MG-MCG PO TABS: 1.0000 | ORAL_TABLET | Freq: Every day | ORAL | 4 refills | Status: DC

## 2017-08-31 NOTE — Progress Notes (Signed)
Donavan BurnetCatharine M Near [birth year] [id number]    History:    Presents for annual exam.  Regular monthly cycle, recently sexually active first partner/condoms. Type 1 diabetes diagnosed at age 26, poor control endocrinologist managing last hemoglobin A1c 10. Normal Pap history. Gardasil series completed.  Past medical history, past surgical history, family history and social history were all reviewed and documented in the EPIC chart. 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 Sister  type 1 diabetes since age 285. Mother healthy, father type 2 diabetes diagnosed this past year.  ROS:  A ROS was performed and pertinent positives and negatives are included.  Exam:  Vitals:   08/31/17 1144  BP: 116/74  Weight: 191 lb (86.6 kg)  Height: 5\' 4"  (1.626 m)   Body mass index is 32.79 kg/m.   General appearance:  Normal Thyroid:  Symmetrical, normal in size, without palpable masses or nodularity. Respiratory  Auscultation:  Clear without wheezing or rhonchi Cardiovascular  Auscultation:  Regular rate, without rubs, murmurs or gallops  Edema/varicosities:  Not grossly evident Abdominal  Soft,nontender, without masses, guarding or rebound.  Liver/spleen:  No organomegaly noted  Hernia:  None appreciated  Skin  Inspection:  Grossly normal   Breasts: Examined lying and sitting.     Right: Without masses, retractions, discharge or axillary adenopathy.     Left: Without masses, retractions, discharge or axillary adenopathy. Gentitourinary   Inguinal/mons:  Normal without inguinal adenopathy  External genitalia: Erythematous  BUS/Urethra/Skene's glands:  Normal  Vagina:  Normal  Cervix:  Normal  Uterus:   normal in size, shape and contour.  Midline and mobile  Adnexa/parametria:     Rt: Without masses or tenderness.   Lt: Without masses or tenderness.  Anus and perineum: Normal    Assessment/Plan:  26 y.o. S WF G0 for annual exam with complaint of occasional yeast  infections with some relief with Monistat.  Monthly cycle/contraception management STD screen Type 1 diabetes ,hypothyroidism-endocrinologist manages labs and meds  Plan: Reviewed importance of low carb diet, diabetes control in relationship to yeast vaginitis. Terazol 7 will use externally as needed prescription, proper use given and reviewed. Yeast prevention discussed. Contraception options reviewed will try Loestrin 1/20 prescription, proper use, slight risk for blood clots and strokes reviewed. Start up instructions discussed, condoms until first month and for infection control. SBE's, exercise, calcium rich diet, MVI daily encouraged. Pap, GC/Chlamydia, HIV, hep B, C, RPR.    Harrington Challengerancy J Breydan [place] AscWHNP, 1:35 PM 08/31/2017

## 2017-08-31 NOTE — Patient Instructions (Signed)
Carbohydrate Counting for Diabetes Mellitus, Adult Carbohydrate counting is a method for keeping track of how many carbohydrates you eat. Eating carbohydrates naturally increases the amount of sugar (glucose) in the blood. Counting how many carbohydrates you eat helps keep your blood glucose within normal limits, which helps you manage your diabetes (diabetes mellitus). It is important to know how many carbohydrates you can safely have in each meal. This is different for every person. A diet and nutrition specialist (registered dietitian) can help you make a meal plan and calculate how many carbohydrates you should have at each meal and snack. Carbohydrates are found in the following foods:  Grains, such as breads and cereals.  Dried beans and soy products.  Starchy vegetables, such as potatoes, peas, and corn.  Fruit and fruit juices.  Milk and yogurt.  Sweets and snack foods, such as cake, cookies, candy, chips, and soft drinks.  How do I count carbohydrates? There are two ways to count carbohydrates in food. You can use either of the methods or a combination of both. Reading "Nutrition Facts" on packaged food The "Nutrition Facts" list is included on the labels of almost all packaged foods and beverages in the [place] It includes:  The serving size.  Information about nutrients in each serving, including the grams (g) of carbohydrate per serving.  To use the "Nutrition Facts":  Decide how many servings you will have.  Multiply the number of servings by the number of carbohydrates per serving.  The resulting number is the total amount of carbohydrates that you will be having.  Learning standard serving sizes of other foods When you eat foods containing carbohydrates that are not packaged or do not include "Nutrition Facts" on the label, you need to measure the servings in order to count the amount of carbohydrates:  Measure the foods that you will eat with a food scale or  measuring cup, if needed.  Decide how many standard-size servings you will eat.  Multiply the number of servings by 15. Most carbohydrate-rich foods have about 15 g of carbohydrates per serving. ? For example, if you eat 8 oz (170 g) of strawberries, you will have eaten 2 servings and 30 g of carbohydrates (2 servings x 15 g = 30 g).  For foods that have more than one food mixed, such as soups and casseroles, you must count the carbohydrates in each food that is included.  The following list contains standard serving sizes of common carbohydrate-rich foods. Each of these servings has about 15 g of carbohydrates:   hamburger bun or  English muffin.   oz (15 mL) syrup.   oz (14 g) jelly.  1 slice of bread.  1 six-inch tortilla.  3 oz (85 g) cooked rice or pasta.  4 oz (113 g) cooked dried beans.  4 oz (113 g) starchy vegetable, such as peas, corn, or potatoes.  4 oz (113 g) hot cereal.  4 oz (113 g) mashed potatoes or  of a large baked potato.  4 oz (113 g) canned or frozen fruit.  4 oz (120 mL) fruit juice.  4-6 crackers.  6 chicken nuggets.  6 oz (170 g) unsweetened dry cereal.  6 oz (170 g) plain fat-free yogurt or yogurt sweetened with artificial sweeteners.  8 oz (240 mL) milk.  8 oz (170 g) fresh fruit or one small piece of fruit.  24 oz (680 g) popped popcorn.  Example of carbohydrate counting Sample meal  3 oz (85 g) chicken breast.    6 oz (170 g) brown rice.  4 oz (113 g) corn.  8 oz (240 mL) milk.  8 oz (170 g) strawberries with sugar-free whipped topping. Carbohydrate calculation 1. Identify the foods that contain carbohydrates: ? Rice. ? Corn. ? Milk. ? Strawberries. 2. Calculate how many servings you have of each food: ? 2 servings rice. ? 1 serving corn. ? 1 serving milk. ? 1 serving strawberries. 3. Multiply each number of servings by 15 g: ? 2 servings rice x 15 g = 30 g. ? 1 serving corn x 15 g = 15 g. ? 1 serving milk x 15  g = 15 g. ? 1 serving strawberries x 15 g = 15 g. 4. Add together all of the amounts to find the total grams of carbohydrates eaten: ? 30 g + 15 g + 15 g + 15 g = 75 g of carbohydrates total. This information is not intended to replace advice given to you by your health care provider. Make sure you discuss any questions you have with your health care provider. Document Released: 10/13/2005 Document Revised: 05/02/2016 Document Reviewed: 03/26/2016 Elsevier Interactive Patient Education  2018 [place] Maintenance, Female Adopting a healthy lifestyle and getting preventive care can go a long way to promote health and wellness. Talk with your health care provider about what schedule of regular examinations is right for you. This is a good chance for you to check in with your provider about disease prevention and staying healthy. In between checkups, there are plenty of things you can do on your own. Experts have done a lot of research about which lifestyle changes and preventive measures are most likely to keep you healthy. Ask your health care provider for more information. Weight and diet Eat a healthy diet  Be sure to include plenty of vegetables, fruits, low-fat dairy products, and lean protein.  Do not eat a lot of foods high in solid fats, added sugars, or salt.  Get regular exercise. This is one of the most important things you can do for your health. ? Most adults should exercise for at least 150 minutes each week. The exercise should increase your heart rate and make you sweat (moderate-intensity exercise). ? Most adults should also do strengthening exercises at least twice a week. This is in addition to the moderate-intensity exercise.  Maintain a healthy weight  Body mass index (BMI) is a measurement that can be used to identify possible weight problems. It estimates body fat based on height and weight. Your health care provider can help determine your BMI and help you  achieve or maintain a healthy weight.  For females 76 years of age and older: ? A BMI below 18.5 is considered underweight. ? A BMI of 18.5 to 24.9 is normal. ? A BMI of 25 to 29.9 is considered overweight. ? A BMI of 30 and above is considered obese.  Watch levels of cholesterol and blood lipids  You should start having your blood tested for lipids and cholesterol at 26 years of age, then have this test every 5 years.  You may need to have your cholesterol levels checked more often if: ? Your lipid or cholesterol levels are high. ? You are older than 26 years of age. ? You are at high risk for heart disease.  Cancer screening Lung Cancer  Lung cancer screening is recommended for adults 52-25 years old who are at high risk for lung cancer because of a history of smoking.  A  yearly low-dose CT scan of the lungs is recommended for people who: ? Currently smoke. ? Have quit within the past 15 years. ? Have at least a 30-pack-year history of smoking. A pack year is smoking an average of one pack of cigarettes a day for 1 year.  Yearly screening should continue until it has been 15 years since you quit.  Yearly screening should stop if you develop a health problem that would prevent you from having lung cancer treatment.  Breast Cancer  Practice breast self-awareness. This means understanding how your breasts normally appear and feel.  It also means doing regular breast self-exams. Let your health care provider know about any changes, no matter how small.  If you are in your 20s or 30s, you should have a clinical breast exam (CBE) by a health care provider every 1-3 years as part of a regular health exam.  If you are 4 or older, have a CBE every year. Also consider having a breast X-ray (mammogram) every year.  If you have a family history of breast cancer, talk to your health care provider about genetic screening.  If you are at high risk for breast cancer, talk to your health  care provider about having an MRI and a mammogram every year.  Breast cancer gene (BRCA) assessment is recommended for women who have family members with BRCA-related cancers. BRCA-related cancers include: ? Breast. ? Ovarian. ? Tubal. ? Peritoneal cancers.  Results of the assessment will determine the need for genetic counseling and BRCA1 and BRCA2 testing.  Cervical Cancer Your health care provider may recommend that you be screened regularly for cancer of the pelvic organs (ovaries, uterus, and vagina). This screening involves a pelvic examination, including checking for microscopic changes to the surface of your cervix (Pap test). You may be encouraged to have this screening done every 3 years, beginning at age 12.  For women ages 13-65, health care providers may recommend pelvic exams and Pap testing every 3 years, or they may recommend the Pap and pelvic exam, combined with testing for human papilloma virus (HPV), every 5 years. Some types of HPV increase your risk of cervical cancer. Testing for HPV may also be done on women of any age with unclear Pap test results.  Other health care providers may not recommend any screening for nonpregnant women who are considered low risk for pelvic cancer and who do not have symptoms. Ask your health care provider if a screening pelvic exam is right for you.  If you have had past treatment for cervical cancer or a condition that could lead to cancer, you need Pap tests and screening for cancer for at least 20 years after your treatment. If Pap tests have been discontinued, your risk factors (such as having a new sexual partner) need to be reassessed to determine if screening should resume. Some women have medical problems that increase the chance of getting cervical cancer. In these cases, your health care provider may recommend more frequent screening and Pap tests.  Colorectal Cancer  This type of cancer can be detected and often  prevented.  Routine colorectal cancer screening usually begins at 26 years of age and continues through 26 years of age.  Your health care provider may recommend screening at an earlier age if you have risk factors for colon cancer.  Your health care provider may also recommend using home test kits to check for hidden blood in the stool.  A small camera at the end of  a tube can be used to examine your colon directly (sigmoidoscopy or colonoscopy). This is done to check for the earliest forms of colorectal cancer.  Routine screening usually begins at age 45.  Direct examination of the colon should be repeated every 5-10 years through 26 years of age. However, you may need to be screened more often if early forms of precancerous polyps or small growths are found.  Skin Cancer  Check your skin from head to toe regularly.  Tell your health care provider about any new moles or changes in moles, especially if there is a change in a mole's shape or color.  Also tell your health care provider if you have a mole that is larger than the size of a pencil eraser.  Always use sunscreen. Apply sunscreen liberally and repeatedly throughout the day.  Protect yourself by wearing long sleeves, pants, a wide-brimmed hat, and sunglasses whenever you are outside.  Heart disease, diabetes, and high blood pressure  High blood pressure causes heart disease and increases the risk of stroke. High blood pressure is more likely to develop in: ? People who have blood pressure in the high end of the normal range (130-139/85-89 mm Hg). ? People who are overweight or obese. ? People who are African American.  If you are 65-44 years of age, have your blood pressure checked every 3-5 years. If you are 35 years of age or older, have your blood pressure checked every year. You should have your blood pressure measured twice-once when you are at a hospital or clinic, and once when you are not at a hospital or clinic.  Record the average of the two measurements. To check your blood pressure when you are not at a hospital or clinic, you can use: ? An automated blood pressure machine at a pharmacy. ? A home blood pressure monitor.  If you are between 55 years and 4 years old, ask your health care provider if you should take aspirin to prevent strokes.  Have regular diabetes screenings. This involves taking a blood sample to check your fasting blood sugar level. ? If you are at a normal weight and have a low risk for diabetes, have this test once every three years after 26 years of age. ? If you are overweight and have a high risk for diabetes, consider being tested at a younger age or more often. Preventing infection Hepatitis B  If you have a higher risk for hepatitis B, you should be screened for this virus. You are considered at high risk for hepatitis B if: ? You were born in a country where hepatitis B is common. Ask your health care provider which countries are considered high risk. ? Your parents were born in a high-risk country, and you have not been immunized against hepatitis B (hepatitis B vaccine). ? You have HIV or AIDS. ? You use needles to inject street drugs. ? You live with someone who has hepatitis B. ? You have had sex with someone who has hepatitis B. ? You get hemodialysis treatment. ? You take certain medicines for conditions, including cancer, organ transplantation, and autoimmune conditions.  Hepatitis C  Blood testing is recommended for: ? Everyone born from [birth year] through 1965. ? Anyone with known risk factors for hepatitis C.  Sexually transmitted infections (STIs)  You should be screened for sexually transmitted infections (STIs) including gonorrhea and chlamydia if: ? You are sexually active and are younger than 26 years of age. ? You are older than  26 years of age and your health care provider tells you that you are at risk for this type of infection. ? Your sexual  activity has changed since you were last screened and you are at an increased risk for chlamydia or gonorrhea. Ask your health care provider if you are at risk.  If you do not have HIV, but are at risk, it may be recommended that you take a prescription medicine daily to prevent HIV infection. This is called pre-exposure prophylaxis (PrEP). You are considered at risk if: ? You are sexually active and do not regularly use condoms or know the HIV status of your partner(s). ? You take drugs by injection. ? You are sexually active with a partner who has HIV.  Talk with your health care provider about whether you are at high risk of being infected with HIV. If you choose to begin PrEP, you should first be tested for HIV. You should then be tested every 3 months for as long as you are taking PrEP. Pregnancy  If you are premenopausal and you may become pregnant, ask your health care provider about preconception counseling.  If you may become pregnant, take 400 to 800 micrograms (mcg) of folic acid every day.  If you want to prevent pregnancy, talk to your health care provider about birth control (contraception). Osteoporosis and menopause  Osteoporosis is a disease in which the bones lose minerals and strength with aging. This can result in serious bone fractures. Your risk for osteoporosis can be identified using a bone density scan.  If you are 29 years of age or older, or if you are at risk for osteoporosis and fractures, ask your health care provider if you should be screened.  Ask your health care provider whether you should take a calcium or vitamin D supplement to lower your risk for osteoporosis.  Menopause may have certain physical symptoms and risks.  Hormone replacement therapy may reduce some of these symptoms and risks. Talk to your health care provider about whether hormone replacement therapy is right for you. Follow these instructions at home:  Schedule regular health, dental,  and eye exams.  Stay current with your immunizations.  Do not use any tobacco products including cigarettes, chewing tobacco, or electronic cigarettes.  If you are pregnant, do not drink alcohol.  If you are breastfeeding, limit how much and how often you drink alcohol.  Limit alcohol intake to no more than 1 drink per day for nonpregnant women. One drink equals 12 ounces of beer, 5 ounces of wine, or 1 ounces of hard liquor.  Do not use street drugs.  Do not share needles.  Ask your health care provider for help if you need support or information about quitting drugs.  Tell your health care provider if you often feel depressed.  Tell your health care provider if you have ever been abused or do not feel safe at home. This information is not intended to replace advice given to you by your health care provider. Make sure you discuss any questions you have with your health care provider. Document Released: 04/28/2011 Document Revised: 03/20/2016 Document Reviewed: 07/17/2015 Elsevier Interactive Patient Education  [employer].

## 2017-09-01 LAB — C. TRACHOMATIS/N. GONORRHOEAE RNA
C. TRACHOMATIS RNA, TMA: NOT DETECTED
N. gonorrhoeae RNA, TMA: NOT DETECTED

## 2017-09-02 LAB — PAP IG W/ RFLX HPV ASCU

## 2017-09-25 ENCOUNTER — Ambulatory Visit: Payer: PRIVATE HEALTH INSURANCE | Admitting: Internal Medicine

## 2017-09-26 ENCOUNTER — Other Ambulatory Visit: Payer: Self-pay | Admitting: Internal Medicine

## 2017-10-21 ENCOUNTER — Other Ambulatory Visit: Payer: Self-pay | Admitting: Internal Medicine

## 2017-11-11 ENCOUNTER — Other Ambulatory Visit: Payer: PRIVATE HEALTH INSURANCE

## 2017-11-12 LAB — HEPATITIS C ANTIBODY
HEP C AB: NONREACTIVE
SIGNAL TO CUT-OFF: 0.02 (ref <1.00)

## 2017-11-12 LAB — RPR: RPR: NONREACTIVE

## 2017-11-12 LAB — HEPATITIS B SURFACE ANTIGEN: HEP B S AG: NONREACTIVE

## 2017-11-12 LAB — HIV ANTIBODY (ROUTINE TESTING W REFLEX): HIV: NONREACTIVE

## 2017-11-16 ENCOUNTER — Telehealth: Payer: Self-pay | Admitting: *Deleted

## 2017-11-16 NOTE — Telephone Encounter (Signed)
Patient informed with negative STD screening result son 11/11/17

## 2017-11-23 ENCOUNTER — Telehealth: Payer: Self-pay | Admitting: Internal Medicine

## 2017-11-23 MED ORDER — INSULIN LISPRO 100 UNIT/ML (KWIKPEN): PEN_INJECTOR | SUBCUTANEOUS | 0 refills | Status: DC

## 2017-11-23 NOTE — Telephone Encounter (Signed)
Patient need a PA for medication Novalog. Please advise

## 2017-11-23 NOTE — Telephone Encounter (Signed)
Sent!

## 2017-11-23 NOTE — Telephone Encounter (Signed)
Ok to change to Humalog

## 2017-11-23 NOTE — Telephone Encounter (Signed)
Please advise on below  

## 2017-12-09 ENCOUNTER — Ambulatory Visit: Payer: PRIVATE HEALTH INSURANCE | Admitting: Internal Medicine

## 2017-12-16 ENCOUNTER — Other Ambulatory Visit: Payer: Self-pay | Admitting: Internal Medicine

## 2017-12-18 ENCOUNTER — Telehealth: Payer: Self-pay | Admitting: Internal Medicine

## 2017-12-18 NOTE — Telephone Encounter (Signed)
error 

## 2018-02-01 ENCOUNTER — Telehealth: Payer: Self-pay | Admitting: Internal Medicine

## 2018-02-01 NOTE — Telephone Encounter (Signed)
insulin lispro (HUMALOG KWIKPEN) 100 UNIT/ML KiwkPen   Patient insurance coverage has recently changed and the new insurance will not take effect till next week Patient would like to know if we have any insulin samples to hold her until her new insurance starts next .  Please advise

## 2018-02-02 ENCOUNTER — Telehealth: Payer: Self-pay | Admitting: Internal Medicine

## 2018-02-02 NOTE — Telephone Encounter (Signed)
Spoke to patient.  She stopped by office earlier and was provided a sample vial.

## 2018-02-02 NOTE — Telephone Encounter (Signed)
Does she need a refill? We can give her a sample vial, if needed.

## 2018-02-02 NOTE — Telephone Encounter (Signed)
Patient called yesterday-waiting for a return call-she needs sample insulin-humalog- she is almost out-has new insurance but coverage won't start until next week. Please call patient at ph# [phone]-Needs to come today before 11 am

## 2018-02-02 NOTE — Telephone Encounter (Signed)
Documented on other phone note.  Closing this encounter.

## 2018-02-02 NOTE — Telephone Encounter (Signed)
Returned call. No answer.  

## 2018-02-22 ENCOUNTER — Telehealth: Payer: Self-pay | Admitting: Emergency Medicine

## 2018-02-22 NOTE — Telephone Encounter (Signed)
Pt called and wants to know if she can get some sample of Lantus and test strips until her insurance gets changed. Patient said her blood sugar has been running a little high so would like to pick up ASAP if possible thanks.

## 2018-02-23 NOTE — Telephone Encounter (Signed)
Yes, if we do have any Lantus or Levemir or Tresiba, we can definitely give  these to her.  Same with strips.

## 2018-02-24 NOTE — Telephone Encounter (Signed)
Spoke to patient. Advised ok to p/u Lantus sample. Unfortunately office does not have Contour test strips. Patient was grateful and verbalized understanding.

## 2018-03-11 ENCOUNTER — Other Ambulatory Visit: Payer: Self-pay | Admitting: Internal Medicine

## 2018-03-12 ENCOUNTER — Other Ambulatory Visit: Payer: Self-pay | Admitting: Internal Medicine

## 2018-03-12 NOTE — Telephone Encounter (Signed)
CG-Plz see refill req/unsure if appropriate as was last seen in August and has cancelled twice as well/plz advise/thx dmf

## 2018-03-26 ENCOUNTER — Telehealth: Payer: Self-pay | Admitting: Internal Medicine

## 2018-03-26 NOTE — Telephone Encounter (Signed)
insulin lispro (HUMALOG KWIKPEN) 100 UNIT/ML KiwkPen   [facility] needs clarification on the prescription. They need to know how much the patient is injecting due to insurance reasons    [phone]

## 2018-03-26 NOTE — Telephone Encounter (Signed)
Spoke with pharmacy tech and she is also aware pt needs appt for further refills.

## 2018-04-27 ENCOUNTER — Other Ambulatory Visit: Payer: Self-pay | Admitting: Internal Medicine

## 2018-06-07 ENCOUNTER — Other Ambulatory Visit: Payer: Self-pay | Admitting: Internal Medicine

## 2018-06-08 ENCOUNTER — Telehealth: Payer: Self-pay | Admitting: Emergency Medicine

## 2018-06-08 MED ORDER — INSULIN LISPRO 100 UNIT/ML (KWIKPEN): PEN_INJECTOR | SUBCUTANEOUS | 0 refills | Status: DC

## 2018-06-08 MED ORDER — INSULIN GLARGINE 100 UNIT/ML SOLOSTAR PEN: PEN_INJECTOR | SUBCUTANEOUS | 0 refills | Status: DC

## 2018-06-08 NOTE — Telephone Encounter (Signed)
Pt called and made an appt for November. She is out of her insulin lispro (HUMALOG KWIKPEN) 100 UNIT/ML KiwkPen and LANTUS SOLOSTAR 100 UNIT/ML Solostar Pen. She wants to know if she can get a refill on these until she is able to be seen. Pharmacy is [facility] [address] thanks.

## 2018-06-08 NOTE — Telephone Encounter (Signed)
Sent!

## 2018-07-27 ENCOUNTER — Other Ambulatory Visit: Payer: Self-pay | Admitting: Internal Medicine

## 2018-07-29 LAB — HM DIABETES EYE EXAM

## 2018-08-23 ENCOUNTER — Other Ambulatory Visit: Payer: Self-pay | Admitting: Internal Medicine

## 2018-08-25 ENCOUNTER — Other Ambulatory Visit: Payer: Self-pay | Admitting: Internal Medicine

## 2018-09-22 ENCOUNTER — Encounter: Payer: Self-pay | Admitting: Internal Medicine

## 2018-09-22 ENCOUNTER — Ambulatory Visit: Payer: BLUE CROSS/BLUE SHIELD | Admitting: Internal Medicine

## 2018-09-22 ENCOUNTER — Other Ambulatory Visit (INDEPENDENT_AMBULATORY_CARE_PROVIDER_SITE_OTHER): Payer: BLUE CROSS/BLUE SHIELD

## 2018-09-22 VITALS — BP 118/78 | HR 90 | Ht 62.0 in | Wt 179.0 lb

## 2018-09-22 DIAGNOSIS — E063 Autoimmune thyroiditis: Secondary | ICD-10-CM | POA: Diagnosis not present

## 2018-09-22 DIAGNOSIS — Z23 Encounter for immunization: Secondary | ICD-10-CM

## 2018-09-22 DIAGNOSIS — E109 Type 1 diabetes mellitus without complications: Secondary | ICD-10-CM | POA: Diagnosis not present

## 2018-09-22 LAB — POCT GLYCOSYLATED HEMOGLOBIN (HGB A1C): HEMOGLOBIN A1C: 10.6 % — AB (ref 4.0–5.6)

## 2018-09-22 LAB — COMPREHENSIVE METABOLIC PANEL
ALBUMIN: 3.9 g/dL (ref 3.5–5.2)
ALT: 24 U/L (ref 0–35)
AST: 24 U/L (ref 0–37)
Alkaline Phosphatase: 105 U/L (ref 39–117)
BUN: 12 mg/dL (ref 6–23)
CHLORIDE: 97 meq/L (ref 96–112)
CO2: 23 mEq/L (ref 19–32)
Calcium: 9 mg/dL (ref 8.4–10.5)
Creatinine, Ser: 0.67 mg/dL (ref 0.40–1.20)
GFR: 111.79 mL/min (ref >60.00)
Glucose, Bld: 297 mg/dL — ABNORMAL HIGH (ref 70–99)
POTASSIUM: 3.8 meq/L (ref 3.5–5.1)
SODIUM: 131 meq/L — AB (ref 135–145)
Total Bilirubin: 0.7 mg/dL (ref 0.2–1.2)
Total Protein: 6.9 g/dL (ref 6.0–8.3)

## 2018-09-22 LAB — MICROALBUMIN / CREATININE URINE RATIO
CREATININE, U: 138.3 mg/dL
MICROALB/CREAT RATIO: 0.7 mg/g (ref 0.0–30.0)
Microalb, Ur: 1 mg/dL (ref 0.0–1.9)

## 2018-09-22 LAB — LIPID PANEL
Cholesterol: 222 mg/dL — ABNORMAL HIGH (ref 0–200)
HDL: 44.2 mg/dL (ref >39.00)
NONHDL: 178.2
Total CHOL/HDL Ratio: 5
Triglycerides: 219 mg/dL — ABNORMAL HIGH (ref 0.0–149.0)
VLDL: 43.8 mg/dL — AB (ref 0.0–40.0)

## 2018-09-22 LAB — LDL CHOLESTEROL, DIRECT: LDL DIRECT: 152 mg/dL

## 2018-09-22 MED ORDER — GLUCAGON (RDNA) 1 MG IJ KIT: 1.0000 mg | PACK | Freq: Once | INTRAMUSCULAR | 4 refills | Status: DC | PRN

## 2018-09-22 MED ORDER — INSULIN DEGLUDEC 200 UNIT/ML ~~LOC~~ SOPN: 36.0000 [IU] | PEN_INJECTOR | Freq: Every day | SUBCUTANEOUS | 5 refills | Status: DC

## 2018-09-22 MED ORDER — INSULIN ASPART (W/NIACINAMIDE) 100 UNIT/ML ~~LOC~~ SOPN: 10.0000 [IU] | PEN_INJECTOR | Freq: Three times a day (TID) | SUBCUTANEOUS | 5 refills | Status: DC

## 2018-09-22 MED ORDER — GLUCOSE BLOOD VI STRP: ORAL_STRIP | 5 refills | Status: DC

## 2018-09-22 NOTE — Progress Notes (Signed)
Patient ID: Traci Rivas, female   DOB: 1990-11-18, 27 y.o.   MRN: [id number]  HPI: Traci Rivas is a 27 y.o.-year-old female, initially referred by her Elon Alas, NP Harle Battiest), returning for follow-up for DM1, diagnosed in 2010 (age 32), uncontrolled, without complications and Hashimoto's hypothyroidism. Last visit 1 year and 3 months ago.    She is not usually compliant with appointments, sugar checks, insulin dosing.  Last hemoglobin A1c was: Lab Results  Component Value Date   HGBA1C 10.0 06/25/2017   HGBA1C 10.7 10/30/2016   HGBA1C 10.5 03/13/2016   Pt was on: - Lantus 36 units at bedtime. - NovoLog - inject 15 min before a meal: - ICR: 15 >> 10 (still using 15!) - 10-17 units per meal - skips b'fast usually - target: 150 >> 120 - ISF: 50  At last visit, we changed to: - Lantus 36 units at bedtime. - Humalog - after a meal: 10-14 units Not using:  Pt checks her sugars 0-1 times a day-sugars very variable - no log, no meter: - am: 80-150 >> 150-200 >> 83, 87-244, 310 >> 80-200s (high if snack at night or larger dinners) - 2h after b'fast: n/c >> 273-339 >> n/c - before lunch: 150-200 >> 200s >> 481 >> 200s - 2h after lunch: n/c >> 203, 272 >> n/c - before dinner: 200s >> 180-200s >> 89-199 >> 100s-200s - 2h after dinner: n/c - goes to the gym 8-10 pm >> 107-303>> n/c - bedtime: 150-200 >> 83-389 >> n/c - nighttime: 50s-70s  - resolved after decreased Lantus >> n/c >> 104-319 Lowest sugar was 52 (overbolused for lunch) >> 83 >> 56 (nighttime) - seldom - 3x in last few mo; she has hypoglycemia awareness in the 80s.  No previous hypoglycemia admission.  She is not sure if she has a glucagon pen at home. Highest sugar was 500 after running out of insulin >> 481 >> 500s.  No previous DKA admissions.  Continues to exercise  - walking.  -No CKD, last BUN/creatinine:  Lab Results  Component Value Date   BUN 13 06/25/2017   CREATININE 0.83 06/25/2017  Previously on  lisinopril, now off.  Normal ACR's: Lab Results  Component Value Date   MICRALBCREAT 0.7 06/25/2017   MICRALBCREAT 0.9 03/13/2016   MICRALBCREAT 2.5 05/02/2013   MICRALBCREAT 2.8 10/11/2012   -+ Dyslipidemia. Last set of lipids: Lab Results  Component Value Date   CHOL 174 06/25/2017   HDL 45.70 06/25/2017   LDLCALC 92 06/25/2017   TRIG 182.0 (H) 06/25/2017   CHOLHDL 4 06/25/2017   - last eye exam was in 07/2018: No DR - No numbness and tingling in her feet.  Also has Hashimoto's hypothyroidism: - prev. on levothyroxine generic (changed from brand name) 37.5 mcg daily >> stopped 2 mo ago (ran out)  Pt was taking the levothyroxine: - in am - fasting - at least 30 min from b'fast - no Ca, Fe, MVI, PPIs - not on Biotin  Latest TSH was normal: Lab Results  Component Value Date   TSH 2.39 06/25/2017   ROS: Constitutional: no weight gain/+ weight loss, no fatigue, no subjective hyperthermia, no subjective hypothermia, + nocturia Eyes: no blurry vision, no xerophthalmia ENT: no sore throat, no nodules palpated in throat, no dysphagia, no odynophagia, no hoarseness Cardiovascular: no CP/no SOB/no palpitations/no leg swelling Respiratory: no cough/no SOB/no wheezing Gastrointestinal: no N/no V/no D/no C/no acid reflux Musculoskeletal: no muscle aches/no joint aches Skin: no rashes, no  hair loss Neurological: no tremors/no numbness/no tingling/no dizziness  I reviewed pt's medications, allergies, PMH, social hx, family hx, and changes were documented in the history of present illness. Otherwise, unchanged from my initial visit note.  Past Medical History:  Diagnosis Date  . Diabetes mellitus    type 1  . Dysmenorrhea   . Hypoglycemia associated with diabetes ([place])   . Hypothyroidism, acquired, autoimmune   . Thyroiditis, autoimmune    No past surgical history on file. Social History   Social History  . Marital Status: Single    Spouse Name: N/A  . Number of  Children: 0   Occupational History  . Not on file.   Social History Main Topics  . Smoking status: Never Smoker   . Smokeless tobacco: Never Used  . Alcohol Use: No  . Drug Use: No  . Sexual Activity: Not Currently    Birth Control/ Protection: Pill   Current Outpatient Medications on File Prior to Visit  Medication Sig Dispense Refill  . BAYER MICROLET LANCETS lancets Use as instructed to check sugar 3 times daily. 200 each 5  . Blood Glucose Monitoring Suppl (BAYER CONTOUR NEXT MONITOR) w/Device KIT Use to check sugar 3 times daily. 1 kit 0  . glucagon 1 MG injection Inject 1 mg into the skin once as needed. Follow package directions for low blood sugar. 2 each 4  . glucose blood (BAYER CONTOUR NEXT TEST) test strip Use as instructed to check sugar 3 times daily. 200 each 5  . Insulin Glargine (LANTUS SOLOSTAR) 100 UNIT/ML Solostar Pen INJECT 35 UNITS UNDER THE SKIN DAILY AT BEDTIME 6 mL 0  . insulin lispro (HUMALOG KWIKPEN) 100 UNIT/ML KiwkPen INJECT UP TO 50 UNITS SUBCUTANEOUSLY ONCE A DAY 15 mL 0  . insulin lispro (HUMALOG KWIKPEN) 100 UNIT/ML KiwkPen INJECT UP TO 50 UNITS SUBCUTANEOUSLY ONCE A DAY 15 mL 0  . levothyroxine (SYNTHROID, LEVOTHROID) 25 MCG tablet Take 1.5 tablets (37.5 mcg total) by mouth daily before breakfast. (Patient not taking: Reported on 09/22/2018) 135 tablet 3  . norethindrone-ethinyl estradiol (MICROGESTIN,JUNEL,LOESTRIN) 1-20 MG-MCG tablet Take 1 tablet daily by mouth. (Patient not taking: Reported on 09/22/2018) 3 Package 4  . terconazole (TERAZOL 7) 0.4 % vaginal cream Place 1 applicator at bedtime vaginally. (Patient not taking: Reported on 09/22/2018) 45 g 3   No current facility-administered medications on file prior to visit.    Allergies  Allergen Reactions  . Amoxicillin Rash  . Penicillins Rash   Family History  Problem Relation Age of Onset  . Diabetes Father   . Heart attack Paternal Grandfather   . Heart disease Paternal Grandfather   .  Diabetes Sister   . Hypothyroidism Sister   . Cancer Paternal Grandmother        Colon   Pt has FH of DM1 in sister.  PE: BP 118/78   Pulse 90   Ht _0  (1.575 m)   Wt 179 lb (81.2 kg)   SpO2 98%   BMI 32.74 kg/m  Wt Readings from Last 3 Encounters:  09/22/18 179 lb (81.2 kg)  08/31/17 191 lb (86.6 kg)  06/25/17 190 lb (86.2 kg)   Constitutional: overweight, in NAD Eyes: PERRLA, EOMI, no exophthalmos ENT: moist mucous membranes, no thyromegaly, no cervical lymphadenopathy Cardiovascular: Tachycardia, RR, No MRG Respiratory: CTA B Gastrointestinal: abdomen soft, NT, ND, BS+ Musculoskeletal: no deformities, strength intact in all 4 Skin: moist, warm, no rashes Neurological: no tremor with outstretched hands, DTR normal in all  4  ASSESSMENT: 1. DM1, uncontrolled, without complications  2. Hashimoto's hypothyroidism  PLAN:  1. Patient with long-standing, uncontrolled, type 1 diabetes, on basal-bolus insulin therapy, returning after long absence of one year and 3 months.  Her latest HbA1c was from last year and this was still high at 10%.  Her sugars are very variable, without any pattern.  I suspected that she was missing some insulin doses but she was also not very proficient at counting carbs.  I referred her to nutrition at that time.  We also decrease her insulin to carb ratio with meals.  I strongly advised her to start the boluses 15 minutes before a meal.  Her sister was just started on a [employer] insulin pump and she wanted to see how she is doing before deciding whether she wants to get on a pump or not. - At this visit, she returns with still variable sugars, without a log, or meter, not completely compliant with the regimen I recommended.  She is not using an insulin to carb ratio, not checking the sugars before every meal and taking the insulin after she eats.  We discussed at length in the past and again today about the need to move the insulin dose 15 minutes before a  meal.  At this visit, I will try to switch her to Fiasp insulin, which can be taken at the start of the meal.  However, she needs to take it every time she eats.  For now, since she is not very comfortable with carb counting, we can keep her on a preestablished insulin dose, but I will again refer her to nutrition (she did not see the nutritionist since last visit) for carb counting refresher.  We will also try to switch from Lantus to [place] U200 which is longer acting and offers more stable control of blood sugars.  However, unfortunately, I cannot make any other changes in her regimen for now until she brings a log or meter and she takes her insulin consistently. - We will check annual labs today - I advised her to: Patient Instructions  Please change: - Lantus to [place] U200 36 units at bedtime. - Humalog to FiAsp- move this at the start of the meal: 10-14 units before a meal  Please schedule an appt with Antonieta Iba with nutrition.  Please return in 4 months with meter.  - today, HbA1c is 10.6% (higher) - continue checking sugars at different times of the day - check 3x a day, rotating checks - advised for yearly eye exams >> she is UTD - Return to clinic in 4 mo with sugar log    2. Hashimoto's hypothyroidism - latest thyroid labs reviewed with pt >> normal at last visit - she was on LT4 generic 37.5 Mcg daily >> ran out >1 year ago! - we discussed that if we end up restarting levothyroxine, she needs to take thyroid hormone every day, with water, >30 minutes before breakfast, separated by >4 hours from acid reflux medications, calcium, iron, multivitamins. Pt. is taking it correctly. - will check thyroid tests today: TSH and fT4 to see if we need to restart levothyroxine. - If labs are abnormal, she will need to return for repeat TFTs in 1.5 months  Lab on 09/22/2018  Component Date Value Ref Range Status  . Sodium 09/22/2018 131* 135 - 145 mEq/L Final  . Potassium 09/22/2018 3.8   3.5 - 5.1 mEq/L Final  . Chloride 09/22/2018 97  96 - 112 mEq/L  Final  . CO2 09/22/2018 23  19 - 32 mEq/L Final  . Glucose, Bld 09/22/2018 297* 70 - 99 mg/dL Final  . BUN 09/22/2018 12  6 - 23 mg/dL Final  . Creatinine, Ser 09/22/2018 0.67  0.40 - 1.20 mg/dL Final  . Total Bilirubin 09/22/2018 0.7  0.2 - 1.2 mg/dL Final  . Alkaline Phosphatase 09/22/2018 105  39 - 117 U/L Final  . AST 09/22/2018 24  0 - 37 U/L Final  . ALT 09/22/2018 24  0 - 35 U/L Final  . Total Protein 09/22/2018 6.9  6.0 - 8.3 g/dL Final  . Albumin 09/22/2018 3.9  3.5 - 5.2 g/dL Final  . Calcium 09/22/2018 9.0  8.4 - 10.5 mg/dL Final  . GFR 09/22/2018 111.79  >60.00 mL/min Final  Office Visit on 09/22/2018  Component Date Value Ref Range Status  . Microalb, Ur 09/22/2018 1.0  0.0 - 1.9 mg/dL Final  . Creatinine,U 09/22/2018 138.3  mg/dL Final  . Microalb Creat Ratio 09/22/2018 0.7  0.0 - 30.0 mg/g Final  . Cholesterol 09/22/2018 222* 0 - 200 mg/dL Final   ATP III Classification       Desirable:  < 200 mg/dL               Borderline High:  200 - 239 mg/dL          High:  > = 240 mg/dL  . Triglycerides 09/22/2018 219.0* 0.0 - 149.0 mg/dL Final   Normal:  <150 mg/dLBorderline High:  150 - 199 mg/dL  . HDL 09/22/2018 44.20  >39.00 mg/dL Final  . VLDL 09/22/2018 43.8* 0.0 - 40.0 mg/dL Final  . Total CHOL/HDL Ratio 09/22/2018 5   Final                  Men          Women1/2 Average Risk     3.4          3.3Average Risk          5.0          4.42X Average Risk          9.6          7.13X Average Risk          15.0          11.0                      . NonHDL 09/22/2018 178.20   Final   NOTE:  Non-HDL goal should be 30 mg/dL higher than patient's LDL goal (i.e. LDL goal of < 70 mg/dL, would have non-HDL goal of < 100 mg/dL)  . Hemoglobin A1C 09/22/2018 10.6* 4.0 - 5.6 % Final  . Direct LDL 09/22/2018 152.0  mg/dL Final   Optimal:  <100 mg/dLNear or Above Optimal:  100-129 mg/dLBorderline High:  130-159 mg/dLHigh:  160-189  mg/dLVery High:  >190 mg/dL   Sugars very high, with pseudo-hyponatremia. LDL and triglycerides high.  Improving diabetes will likely help lower her cholesterol levels.  GFR normal.  ACR not elevated.  Philemon Kingdom, MD PhD [facility]

## 2018-09-22 NOTE — Patient Instructions (Addendum)
Please change: - Lantus to [place] U200 36 units at bedtime. - Humalog to FiAsp- move this at the start of the meal: 10-14 units before a meal  Please schedule an appt with Oran ReinLaura Jobe with nutrition.  Please return in 4 months with meter.

## 2018-09-27 ENCOUNTER — Telehealth: Payer: Self-pay

## 2018-09-27 NOTE — Telephone Encounter (Signed)
-----   Message from Cristina Gherghe, MD sent at 1Carlus Pavlov2/11/2017  2:16 PM EST ----- Efraim KaufmannMelissa, can you please call pt: Traci Rivas very high. LDL and triglycerides high.  Improving diabetes will likely help lower her cholesterol levels.  Kidney fxn normal. No urinary proteins..Marland Kitchen

## 2018-09-28 NOTE — Telephone Encounter (Signed)
Gave patient results and she stated an understanding- patient also wanted to make MD aware that she was not fasting for these labs

## 2018-09-28 NOTE — Telephone Encounter (Signed)
Noted. Ty!

## 2018-09-30 ENCOUNTER — Telehealth: Payer: Self-pay | Admitting: Family Medicine

## 2018-09-30 NOTE — Telephone Encounter (Signed)
Ok, let's see how she does

## 2018-09-30 NOTE — Telephone Encounter (Signed)
Patient called stating that she stared a new insulin on last week and she is having nausea, diarrhea, and feeling dizzy after starting this medication. She is wondering if it could be side effects from this medication. The medication is [place]. She states that she is finishing up her old insulin but she is not sure if its because she is mixing two different insulins . Please advise.

## 2018-09-30 NOTE — Telephone Encounter (Signed)
Let's only sty on Lantus for the next 3-4 days, then start [place] again. It would be unusual from the sxs to be from [place] (unless she is having many lows...)

## 2018-09-30 NOTE — Telephone Encounter (Signed)
Routing...

## 2018-09-30 NOTE — Telephone Encounter (Signed)
Called and spoke with pt and gave her MD message. Pt stated that she was sure it was not the [place], [place] just wanted to make sure with the doctor. Pt stated that she was also out of Lantus and she would just continue to [place].

## 2018-09-30 NOTE — Telephone Encounter (Signed)
Could you please advise?

## 2018-10-28 ENCOUNTER — Ambulatory Visit: Payer: BLUE CROSS/BLUE SHIELD | Admitting: Dietician

## 2018-11-09 ENCOUNTER — Ambulatory Visit: Payer: BLUE CROSS/BLUE SHIELD | Admitting: Family Medicine

## 2018-11-12 ENCOUNTER — Encounter: Payer: Self-pay | Admitting: Family Medicine

## 2018-11-12 ENCOUNTER — Ambulatory Visit (INDEPENDENT_AMBULATORY_CARE_PROVIDER_SITE_OTHER): Payer: BLUE CROSS/BLUE SHIELD | Admitting: Family Medicine

## 2018-11-12 VITALS — BP 92/70 | HR 88 | Temp 97.7°F | Ht 62.0 in | Wt 177.4 lb

## 2018-11-12 DIAGNOSIS — F419 Anxiety disorder, unspecified: Secondary | ICD-10-CM

## 2018-11-12 DIAGNOSIS — F321 Major depressive disorder, single episode, moderate: Secondary | ICD-10-CM | POA: Insufficient documentation

## 2018-11-12 DIAGNOSIS — Z23 Encounter for immunization: Secondary | ICD-10-CM | POA: Diagnosis not present

## 2018-11-12 LAB — VITAMIN D 25 HYDROXY (VIT D DEFICIENCY, FRACTURES): VITD: 23.46 ng/mL — ABNORMAL LOW (ref 30.00–100.00)

## 2018-11-12 LAB — VITAMIN B12: Vitamin B-12: 244 pg/mL (ref 211–911)

## 2018-11-12 MED ORDER — ESCITALOPRAM OXALATE 10 MG PO TABS: 10.0000 mg | ORAL_TABLET | Freq: Every day | ORAL | 2 refills | Status: DC

## 2018-11-12 NOTE — Patient Instructions (Signed)

## 2018-11-12 NOTE — Assessment & Plan Note (Signed)
Start lexapro rto 1 month or sooner prn

## 2018-11-12 NOTE — Assessment & Plan Note (Signed)
Start lexapro 10 mg daily rto 1 month or sooner prn

## 2018-11-12 NOTE — Progress Notes (Signed)
Patient ID: Traci Rivas, female    DOB: 1990-11-05  Age: 28 y.o. MRN: [id number]    Subjective:  Subjective  HPI Traci Rivas presents for depression and some anxiety.  She is not suicidal   phq9 17  GAD7  14  She is seeing a Social worker and she advised that she discuss getting on meds with me.    Review of Systems  Constitutional: Negative for appetite change, diaphoresis, fatigue and unexpected weight change.  Eyes: Negative for pain, redness and visual disturbance.  Respiratory: Negative for cough, chest tightness, shortness of breath and wheezing.   Cardiovascular: Negative for chest pain, palpitations and leg swelling.  Endocrine: Negative for cold intolerance, heat intolerance, polydipsia, polyphagia and polyuria.  Genitourinary: Negative for difficulty urinating, dysuria and frequency.  Neurological: Negative for dizziness, light-headedness, numbness and headaches.  Psychiatric/Behavioral: Positive for dysphoric mood and sleep disturbance. Negative for self-injury. The patient is nervous/anxious.     History Past Medical History:  Diagnosis Date  . Diabetes mellitus    type 1  . Dysmenorrhea   . Hypoglycemia associated with diabetes ([place])   . Hypothyroidism, acquired, autoimmune   . Thyroiditis, autoimmune     She has no past surgical history on file.   Her family history includes Cancer in her paternal grandmother; Diabetes in her father and sister; Heart attack in her paternal grandfather; Heart disease in her paternal grandfather; Hypothyroidism in her sister.She reports that she has never smoked. She has never used smokeless tobacco. She reports that she does not drink alcohol or use drugs.  Current Outpatient Medications on File Prior to Visit  Medication Sig Dispense Refill  . BAYER MICROLET LANCETS lancets Use as instructed to check sugar 3 times daily. 200 each 5  . Blood Glucose Monitoring Suppl (BAYER CONTOUR NEXT MONITOR) w/Device KIT Use to check sugar  3 times daily. 1 kit 0  . glucagon 1 MG injection Inject 1 mg into the skin once as needed. Follow package directions for low blood sugar. 2 each 4  . glucose blood (BAYER CONTOUR NEXT TEST) test strip Use as instructed to check sugar 4 times daily. 300 each 5  . Insulin Aspart, w/Niacinamide, (FIASP FLEXTOUCH) 100 UNIT/ML SOPN Inject 10-14 Units into the skin 3 (three) times daily before meals. 30 mL 5  . Insulin Degludec (TRESIBA FLEXTOUCH) 200 UNIT/ML SOPN Inject 36 Units into the skin daily. 6 pen 5   No current facility-administered medications on file prior to visit.      Objective:  Objective  Physical Exam Vitals signs and nursing note reviewed.  Constitutional:      Appearance: She is well-developed.  HENT:     Head: Normocephalic and atraumatic.  Eyes:     Conjunctiva/sclera: Conjunctivae normal.  Neck:     Musculoskeletal: Normal range of motion and neck supple.     Thyroid: No thyromegaly.     Vascular: No carotid bruit or JVD.  Cardiovascular:     Rate and Rhythm: Normal rate and regular rhythm.     Heart sounds: Normal heart sounds. No murmur.  Pulmonary:     Effort: Pulmonary effort is normal. No respiratory distress.     Breath sounds: Normal breath sounds. No wheezing or rales.  Chest:     Chest wall: No tenderness.  Neurological:     Mental Status: She is alert and oriented to person, place, and time.  Psychiatric:        Attention and Perception: Attention and perception  normal.        Mood and Affect: Mood is anxious and depressed. Mood is not elated. Affect is tearful. Affect is not labile, blunt, flat, angry or inappropriate.        Speech: Speech normal.        Behavior: Behavior normal.        Thought Content: Thought content is not paranoid. Thought content does not include homicidal ideation. Thought content does not include homicidal plan.        Cognition and Memory: Cognition normal.        Judgment: Judgment normal.    BP 92/70 (BP Location:  Left Arm, Patient Position: Sitting, Cuff Size: Normal)   Pulse 88   Temp 97.7 F (36.5 C) (Oral)   Ht '5\' 2"'  (1.575 m)   Wt 177 lb 6.4 oz (80.5 kg)   SpO2 98%   BMI 32.45 kg/m  Wt Readings from Last 3 Encounters:  11/12/18 177 lb 6.4 oz (80.5 kg)  09/22/18 179 lb (81.2 kg)  08/31/17 191 lb (86.6 kg)     Lab Results  Component Value Date   WBC 3.9 (L) 12/04/2014   HGB 15.0 12/04/2014   HCT 43.6 12/04/2014   PLT 291.0 12/04/2014   GLUCOSE 297 (H) 09/22/2018   CHOL 222 (H) 09/22/2018   TRIG 219.0 (H) 09/22/2018   HDL 44.20 09/22/2018   LDLDIRECT 152.0 09/22/2018   LDLCALC 92 06/25/2017   ALT 24 09/22/2018   AST 24 09/22/2018   NA 131 (L) 09/22/2018   K 3.8 09/22/2018   CL 97 09/22/2018   CREATININE 0.67 09/22/2018   BUN 12 09/22/2018   CO2 23 09/22/2018   TSH 2.39 06/25/2017   HGBA1C 10.6 (A) 09/22/2018   MICROALBUR 1.0 09/22/2018    US Abdomen Complete  Result Date: 12/07/2014 CLINICAL DATA:  Elevated LFTs. EXAM: ULTRASOUND ABDOMEN COMPLETE COMPARISON:  None. FINDINGS: Gallbladder: No gallstones or wall thickening visualized. No sonographic Murphy sign noted. Common bile duct: Diameter: 2 mm Liver: Echogenic liver suggesting fatty infiltration. IVC: No abnormality visualized. Pancreas: Visualized portion unremarkable. Spleen: Size and appearance within normal limits. Right Kidney: Length: 11.6 cm. Echogenicity within normal limits. No mass or hydronephrosis visualized. Left Kidney: Length: 11.3 cm. Echogenicity within normal limits. No mass or hydronephrosis visualized. Abdominal aorta: No aneurysm visualized. Other findings: None. IMPRESSION: Echogenic liver suggesting fatty infiltration. Electronically Signed   By: Marcello Moores  Register   On: 12/07/2014 09:11     Assessment & Plan:  Plan  I have discontinued Traci Rivas. Traci Rivas levothyroxine, terconazole, norethindrone-ethinyl estradiol, insulin lispro, and Insulin Glargine. I am also having her start on escitalopram.  Additionally, I am having her maintain her BAYER CONTOUR NEXT MONITOR, BAYER MICROLET LANCETS, Insulin Degludec, glucagon, Insulin Aspart (w/Niacinamide), and glucose blood.  Meds ordered this encounter  Medications  . escitalopram (LEXAPRO) 10 MG tablet    Sig: Take 1 tablet (10 mg total) by mouth daily.    Dispense:  30 tablet    Refill:  2    Problem List Items Addressed This Visit      Unprioritized   Anxiety    Start lexapro rto 1 month or sooner prn      Relevant Medications   escitalopram (LEXAPRO) 10 MG tablet   Other Relevant Orders   Thyroid Panel With TSH   Vitamin B12 (Completed)   Vitamin D (25 hydroxy) (Completed)   Depression, major, single episode, moderate ([facility]) - Primary    Start lexapro 10 mg  daily rto 1 month or sooner prn       Relevant Medications   escitalopram (LEXAPRO) 10 MG tablet   Other Relevant Orders   Thyroid Panel With TSH   Vitamin B12 (Completed)   Vitamin D (25 hydroxy) (Completed)    Other Visit Diagnoses    Need for pneumococcal vaccination       Relevant Orders   Pneumococcal polysaccharide vaccine 23-valent greater than or equal to 2yo subcutaneous/IM (Completed)   Need for Tdap vaccination       Relevant Orders   Tdap vaccine greater than or equal to 7yo IM (Completed)      Follow-up: Return in about 4 weeks (around 12/10/2018), or if symptoms worsen or fail to improve, for depression/ anxiety.  Ann Held, DO

## 2018-11-13 LAB — THYROID PANEL WITH TSH
FREE THYROXINE INDEX: 2 (ref 1.4–3.8)
T3 Uptake: 32 % (ref 22–35)
T4, Total: 6.2 ug/dL (ref 5.1–11.9)
TSH: 4.65 mIU/L — ABNORMAL HIGH

## 2018-11-16 ENCOUNTER — Telehealth: Payer: Self-pay

## 2018-11-16 NOTE — Telephone Encounter (Signed)
Notified patient of message from Dr. Gherghe, patient expressed understanding and agreement. No further questions.  

## 2018-11-16 NOTE — Telephone Encounter (Signed)
-----   Message from Carlus Pavlov, MD sent at 11/15/2018  4:54 PM EST ----- Traci Rivas, can you please call pt: I received the results of her recent thyroid test from PCP.  TSH was slightly above the upper limit of normal while the rest of the thyroid test were normal.  I would like to repeat her TSH when she comes back in March but no intervention is needed for now.

## 2018-11-17 ENCOUNTER — Other Ambulatory Visit: Payer: Self-pay | Admitting: *Deleted

## 2018-11-17 DIAGNOSIS — E538 Deficiency of other specified B group vitamins: Secondary | ICD-10-CM

## 2018-11-17 MED ORDER — CYANOCOBALAMIN 1000 MCG/ML IJ SOLN: INTRAMUSCULAR | 0 refills | Status: DC

## 2018-11-17 MED ORDER — "SYRINGE 25G X 5/8"" 3 ML MISC": 5 refills | Status: DC

## 2018-11-17 MED ORDER — CYANOCOBALAMIN 1000 MCG/ML IJ SOLN: INTRAMUSCULAR | 1 refills | Status: DC

## 2018-11-17 MED ORDER — VITAMIN D (ERGOCALCIFEROL) 1.25 MG (50000 UNIT) PO CAPS: 50000.0000 [IU] | ORAL_CAPSULE | ORAL | 0 refills | Status: DC

## 2018-12-20 ENCOUNTER — Other Ambulatory Visit (INDEPENDENT_AMBULATORY_CARE_PROVIDER_SITE_OTHER): Payer: BLUE CROSS/BLUE SHIELD

## 2018-12-20 DIAGNOSIS — E538 Deficiency of other specified B group vitamins: Secondary | ICD-10-CM | POA: Diagnosis not present

## 2018-12-20 LAB — VITAMIN B12: Vitamin B-12: 814 pg/mL (ref 211–911)

## 2018-12-22 ENCOUNTER — Encounter: Payer: Self-pay | Admitting: *Deleted

## 2019-01-23 NOTE — Progress Notes (Deleted)
Patient ID: Traci Rivas, female   DOB: 1991-09-12, 28 y.o.   MRN: [id number]  HPI: Traci Rivas is a 28 y.o.-year-old female, initially referred by her Elon Alas, NP Harle Battiest), returning for follow-up for DM1, diagnosed in 2010 (age 10), uncontrolled, without complications and Hashimoto's thyroiditis. Last visit 4 months ago.    She is not usually compliant with appointments, sugar checks, insulin dosing.  Last hemoglobin A1c was: Lab Results  Component Value Date   HGBA1C 10.6 (A) 09/22/2018   HGBA1C 10.0 06/25/2017   HGBA1C 10.7 10/30/2016   Pt was on: - Lantus 36 units at bedtime. - NovoLog - inject 15 min before a meal: - ICR: 15 >> 10 (still using 15!) - 10-17 units per meal - skips b'fast usually - target: 150 >> 120 - ISF: 50  Now on: -Tresiba U200 36 units daily -Fiasp 10 to 14 units at the start of every meal  Pt checks her sugars 0-1 time a day: - am: 150-200 >> 83, 87-244, 310 >> 80-200s - 2h after b'fast: n/c >> 273-339 >> n/c - before lunch: 150-200 >> 200s >> 481 >> 200s - 2h after lunch: n/c >> 203, 272 >> n/c - before dinner: 180-200s >> 89-199 >> 100s-200s - 2h after dinner: goes to the gym 8-10 pm >> 107-303>> n/c - bedtime: 150-200 >> 83-389 >> n/c - nighttime: 50s-70s   >> n/c >> 104-319 Lowest sugar was 52 (overbolused for lunch) >> 83 >> 56 (nighttime) >> ***; she has hypoglycemia awareness in the 80s.  No previous hypoglycemia admission.  She has a glucagon pen at home. Highest sugar was 500s >> ***.  No previous DKA admissions.  Continues to exercise  - walking.  -No CKD, last BUN/creatinine:  Lab Results  Component Value Date   BUN 12 09/22/2018   CREATININE 0.67 09/22/2018  Previously on lisinopril, now off  Normal ACR's: Lab Results  Component Value Date   MICRALBCREAT 0.7 09/22/2018   MICRALBCREAT 0.7 06/25/2017   MICRALBCREAT 0.9 03/13/2016   MICRALBCREAT 2.5 05/02/2013   MICRALBCREAT 2.8 10/11/2012   -+ Dyslipidemia. Last  set of lipids: Lab Results  Component Value Date   CHOL 222 (H) 09/22/2018   HDL 44.20 09/22/2018   LDLCALC 92 06/25/2017   LDLDIRECT 152.0 09/22/2018   TRIG 219.0 (H) 09/22/2018   CHOLHDL 5 09/22/2018   - last eye exam was in 07/2018: No DR - no numbness and tingling in her feet.  Also has Hashimoto's hypothyroidism: - prev. on levothyroxine generic (changed from brand name) 37.5 mcg daily but ran out long before last visit  Latest TSH was normal and we did not restart her levothyroxine then: Lab Results  Component Value Date   TSH 4.65 (H) 11/12/2018   ROS: Constitutional: no weight gain/no weight loss, no fatigue, no subjective hyperthermia, no subjective hypothermia Eyes: no blurry vision, no xerophthalmia ENT: no sore throat, + see HPI Cardiovascular: no CP/no SOB/no palpitations/no leg swelling Respiratory: no cough/no SOB/no wheezing Gastrointestinal: no N/no V/no D/no C/no acid reflux Musculoskeletal: no muscle aches/no joint aches Skin: no rashes, no hair loss Neurological: no tremors/no numbness/no tingling/no dizziness  I reviewed pt's medications, allergies, PMH, social hx, family hx, and changes were documented in the history of present illness. Otherwise, unchanged from my initial visit note.  Past Medical History:  Diagnosis Date  . Diabetes mellitus    type 1  . Dysmenorrhea   . Hypoglycemia associated with diabetes ([place])   . Hypothyroidism,  acquired, autoimmune   . Thyroiditis, autoimmune    No past surgical history on file. Social History   Social History  . Marital Status: Single    Spouse Name: N/A  . Number of Children: 0   Occupational History  . Not on file.   Social History Main Topics  . Smoking status: Never Smoker   . Smokeless tobacco: Never Used  . Alcohol Use: No  . Drug Use: No  . Sexual Activity: Not Currently    Birth Control/ Protection: Pill   Current Outpatient Medications on File Prior to Visit  Medication Sig  Dispense Refill  . BAYER MICROLET LANCETS lancets Use as instructed to check sugar 3 times daily. 200 each 5  . Blood Glucose Monitoring Suppl (BAYER CONTOUR NEXT MONITOR) w/Device KIT Use to check sugar 3 times daily. 1 kit 0  . cyanocobalamin (,VITAMIN B-12,) 1000 MCG/ML injection Inject 40m subcutaneously once weekly. 4 mL 0  . cyanocobalamin (,VITAMIN B-12,) 1000 MCG/ML injection Inject 114msubcutaneously monthly. Please place on hold til patient finish weekly rx. 3 mL 1  . escitalopram (LEXAPRO) 10 MG tablet Take 1 tablet (10 mg total) by mouth daily. 30 tablet 2  . glucagon 1 MG injection Inject 1 mg into the skin once as needed. Follow package directions for low blood sugar. 2 each 4  . glucose blood (BAYER CONTOUR NEXT TEST) test strip Use as instructed to check sugar 4 times daily. 300 each 5  . Insulin Aspart, w/Niacinamide, (FIASP FLEXTOUCH) 100 UNIT/ML SOPN Inject 10-14 Units into the skin 3 (three) times daily before meals. 30 mL 5  . Insulin Degludec (TRESIBA FLEXTOUCH) 200 UNIT/ML SOPN Inject 36 Units into the skin daily. 6 pen 5  . Syringe/Needle, Disp, (SYRINGE 3CC/25GX5/8") 25G X 5/8" 3 ML MISC Use as directed with B12 injection 10 each 5  . Vitamin D, Ergocalciferol, (DRISDOL) 1.25 MG (50000 UT) CAPS capsule Take 1 capsule (50,000 Units total) by mouth every 7 (seven) days. 12 capsule 0   No current facility-administered medications on file prior to visit.    Allergies  Allergen Reactions  . Amoxicillin Rash  . Penicillins Rash   Family History  Problem Relation Age of Onset  . Diabetes Father   . Heart attack Paternal Grandfather   . Heart disease Paternal Grandfather   . Diabetes Sister   . Hypothyroidism Sister   . Cancer Paternal Grandmother        Colon   Pt has FH of DM1 in sister.  PE: There were no vitals taken for this visit. Wt Readings from Last 3 Encounters:  11/12/18 177 lb 6.4 oz (80.5 kg)  09/22/18 179 lb (81.2 kg)  08/31/17 191 lb (86.6 kg)    Constitutional: overweight, in NAD Eyes: PERRLA, EOMI, no exophthalmos ENT: moist mucous membranes, no thyromegaly, no cervical lymphadenopathy Cardiovascular: RRR, No MRG Respiratory: CTA B Gastrointestinal: abdomen soft, NT, ND, BS+ Musculoskeletal: no deformities, strength intact in all 4 Skin: moist, warm, no rashes Neurological: no tremor with outstretched hands, DTR normal in all 4  ASSESSMENT: 1. DM1, uncontrolled, without complications  2. Hashimoto's hypothyroidism  PLAN:  1. Patient with longstanding, uncontrolled type 1 diabetes, on basal-bolus insulin therapy, with poor control based on an HbA1c at last visit of 10.6%.  At that time, sugars were still variable but she did not bring a log or meter so I can review the data, and she was also not completely compliant with her insulin regimen.  She was  not using an insulin to carb ratio, not checking sugars to use for sliding scale, and was taking the insulin after she ate.  Therefore, at that time, I gave her a regimen of fixed doses of rapid acting insulin, based on the size of the meal and we also changed to [place] so that she can inject them at the start of the meal.  She was telling me then that her sister was just started on the Medtr[employer] insulin pump and she wanted to see how her sister was doing before deciding whether she wanted a pump or not.  She was not comfortable with carb counting at that time and I referred her to nutrition for this.  She did not have the appointment yet.  We also switched from Lantus to [place] U200 then to hopefully limit fluctuations in her sugars.  - I advised her to: Patient Instructions  Please continue: -Tresiba U200 36 units daily -Fiasp 10 to 14 units at the start of every meal  Please schedule an appt with Antonieta Iba with nutrition.  Please return in 4 months with your meter.  - today, HbA1c is 7%  - continue checking sugars at different times of the day - check 3x a day, rotating  checks - advised for yearly eye exams >> she is UTD - Return to clinic in 4 mo with sugar log   2. Hashimoto's thyroiditis -Approximately a year before last visit she stopped her levothyroxine completely after she ran out.  At that time, she was taking 37.5 mcg daily. -She had labs with PCP since her last visit and the TSH returned slightly elevated, at 4.65 in 10/2018.  Based on this slight increase, we did not restart levothyroxine then. -We will check her TFTs today.  3.  Goiter -She denies any neck compression symptoms -We will continue to just follow this up for now clinically  Philemon Kingdom, MD PhD [facility] End[facility]

## 2019-01-24 ENCOUNTER — Ambulatory Visit: Payer: BLUE CROSS/BLUE SHIELD | Admitting: Internal Medicine

## 2019-03-01 ENCOUNTER — Telehealth: Payer: Self-pay

## 2019-03-01 NOTE — Telephone Encounter (Signed)
LM for patient to call us back to reschedule her missed appointment.

## 2019-03-14 ENCOUNTER — Encounter: Payer: Self-pay | Admitting: Family Medicine

## 2019-03-14 ENCOUNTER — Other Ambulatory Visit: Payer: Self-pay

## 2019-03-14 ENCOUNTER — Ambulatory Visit (INDEPENDENT_AMBULATORY_CARE_PROVIDER_SITE_OTHER): Payer: BLUE CROSS/BLUE SHIELD | Admitting: Family Medicine

## 2019-03-14 VITALS — BP 97/72 | HR 84 | Ht 62.0 in | Wt 165.0 lb

## 2019-03-14 DIAGNOSIS — M5412 Radiculopathy, cervical region: Secondary | ICD-10-CM | POA: Diagnosis not present

## 2019-03-14 MED ORDER — DICLOFENAC SODIUM 75 MG PO TBEC: 75.0000 mg | DELAYED_RELEASE_TABLET | Freq: Two times a day (BID) | ORAL | 1 refills | Status: DC

## 2019-03-14 NOTE — Patient Instructions (Signed)
You have an irritated nerve from your neck into your arm (likely the C7 nerve root).  Diclofenac 75mg  twice a day with food for pain and inflammation for 7 days then as needed - do not take aleve or ibuprofen with this. Consider cervical collar if severely painful. Simple range of motion exercises within limits of pain to prevent further stiffness. Consider physical therapy for stretching, exercises, traction, and modalities. Watch head position when on computers, texting, when sleeping in bed - should in line with back to prevent further nerve traction and irritation. Follow up with me in 4-6 weeks.

## 2019-03-14 NOTE — Progress Notes (Signed)
PCP: Ann Held, DO  Subjective:   HPI: Patient is a 28 y.o. female here for right arm numbness.  Patient reports 2 weeks of right shoulder and arm numbness.  She denies any injury.  She reports the symptoms primarily at night and developed numbness above the lateral elbow radiating to the second third and fourth fingers dorsally.  Primarily affecting the middle finger.  Currently 0/10 symptoms but can be as bad as 6/10.  Typically pain is not an issue but she does occasionally get some aching pain and tightness in the lateral elbow or shoulder.  She denies anything exacerbates her symptoms.  She denies any weakness.  No associated skin changes.  She has not tried any treatments thus far.  Past Medical History:  Diagnosis Date  . Diabetes mellitus    type 1  . Dysmenorrhea   . Hypoglycemia associated with diabetes ([place])   . Hypothyroidism, acquired, autoimmune   . Thyroiditis, autoimmune     Current Outpatient Medications on File Prior to Visit  Medication Sig Dispense Refill  . BAYER MICROLET LANCETS lancets Use as instructed to check sugar 3 times daily. 200 each 5  . Blood Glucose Monitoring Suppl (BAYER CONTOUR NEXT MONITOR) w/Device KIT Use to check sugar 3 times daily. 1 kit 0  . cyanocobalamin (,VITAMIN B-12,) 1000 MCG/ML injection Inject 74m subcutaneously once weekly. 4 mL 0  . cyanocobalamin (,VITAMIN B-12,) 1000 MCG/ML injection Inject 141msubcutaneously monthly. Please place on hold til patient finish weekly rx. 3 mL 1  . escitalopram (LEXAPRO) 10 MG tablet Take 1 tablet (10 mg total) by mouth daily. 30 tablet 2  . glucagon 1 MG injection Inject 1 mg into the skin once as needed. Follow package directions for low blood sugar. 2 each 4  . glucose blood (BAYER CONTOUR NEXT TEST) test strip Use as instructed to check sugar 4 times daily. 300 each 5  . Insulin Aspart, w/Niacinamide, (FIASP FLEXTOUCH) 100 UNIT/ML SOPN Inject 10-14 Units into the skin 3 (three) times  daily before meals. 30 mL 5  . Insulin Degludec (TRESIBA FLEXTOUCH) 200 UNIT/ML SOPN Inject 36 Units into the skin daily. 6 pen 5  . Syringe/Needle, Disp, (SYRINGE 3CC/25GX5/8") 25G X 5/8" 3 ML MISC Use as directed with B12 injection 10 each 5  . Vitamin D, Ergocalciferol, (DRISDOL) 1.25 MG (50000 UT) CAPS capsule Take 1 capsule (50,000 Units total) by mouth every 7 (seven) days. 12 capsule 0   No current facility-administered medications on file prior to visit.     No past surgical history on file.  Allergies  Allergen Reactions  . Amoxicillin Rash  . Penicillins Rash    Social History   Socioeconomic History  . Marital status: Single    Spouse name: Not on file  . Number of children: Not on file  . Years of education: Not on file  . Highest education level: Not on file  Occupational History  . 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 Financial resource strain: Not on file  . Food insecurity:    Worry: Not on file    Inability: Not on file  . Transportation needs:    Medical: Not on file    Non-medical: Not on file  Tobacco Use  . Smoking status: Never Smoker  . Smokeless tobacco: Never Used  Substance and Sexual Activity  . Alcohol use: No    Alcohol/week: 0.0 standard drinks  . Drug use:  No  . Sexual activity: Yes    Partners: Male    Birth control/protection: Condom  Lifestyle  . Physical activity:    Days per week: Not on file    Minutes per session: Not on file  . Stress: Not on file  Relationships  . Social connections:    Talks on phone: Not on file    Gets together: Not on file    Attends religious service: Not on file    Active member of club or organization: Not on file    Attends meetings of clubs or organizations: Not on file    Relationship status: Not on file  . Intimate partner violence:    Fear of current or ex partner: Not on file    Emotionally abused: Not on file    Physically abused: Not on file     Forced sexual activity: Not on file  Other Topics Concern  . Not on file  Social History Narrative  . Not on file    Family History  Problem Relation Age of Onset  . Diabetes Father   . Heart attack Paternal Grandfather   . Heart disease Paternal Grandfather   . Diabetes Sister   . Hypothyroidism Sister   . Cancer Paternal Grandmother        Colon    BP 97/72   Pulse 84   Ht '5\' 2"'  (1.575 m)   Wt 165 lb (74.8 kg)   BMI 30.18 kg/m   Review of Systems: See HPI above.     Objective:  Physical Exam:  Gen: awake, alert, NAD, comfortable in exam room Pulm: breathing unlabored  Cervical exam: No obvious deformity Full range of motion of the cervical spine No midline or paraspinal tenderness Sensation intact light touch in bilateral upper extremities Full range of motion of the bilateral upper extremities 5/5 strength in the bilateral upper extremities NVI distally.  Right shoulder: No obvious deformity or asymmetry. No bruising. No swelling No TTP Full ROM in flexion, abduction, internal/external rotation NV intact distally Special Tests:  - Impingement: Neg [place].  - Supraspinatus: Negative empty can.  5/5 strength - Infraspinatus/Teres: 5/5 strength with ER - Subscapularis: . 5/5 strength with IR  Right elbow: No deformity No tenderness over the lateral elbow Full range of motion N/V intact   Assessment & Plan:  1.  Numbness in the right arm likely secondary to C7 radiculopathy. - Diclofenac twice daily for 1 week then as needed - Consider gabapentin - Home rehab exercises - Follow-up in 4-6

## 2019-04-03 ENCOUNTER — Emergency Department (HOSPITAL_COMMUNITY)
Admission: EM | Admit: 2019-04-03 | Discharge: 2019-04-03 | Disposition: A | Discharge status: Home / Self Care | Payer: BC Managed Care – PPO | Attending: Emergency Medicine | Admitting: Emergency Medicine

## 2019-04-03 ENCOUNTER — Encounter (HOSPITAL_COMMUNITY): Payer: Self-pay

## 2019-04-03 ENCOUNTER — Other Ambulatory Visit: Payer: Self-pay

## 2019-04-03 DIAGNOSIS — R350 Frequency of micturition: Secondary | ICD-10-CM | POA: Diagnosis present

## 2019-04-03 DIAGNOSIS — I10 Essential (primary) hypertension: Secondary | ICD-10-CM | POA: Diagnosis not present

## 2019-04-03 DIAGNOSIS — E876 Hypokalemia: Secondary | ICD-10-CM

## 2019-04-03 DIAGNOSIS — Z79899 Other long term (current) drug therapy: Secondary | ICD-10-CM | POA: Insufficient documentation

## 2019-04-03 DIAGNOSIS — Z794 Long term (current) use of insulin: Secondary | ICD-10-CM | POA: Insufficient documentation

## 2019-04-03 DIAGNOSIS — E109 Type 1 diabetes mellitus without complications: Secondary | ICD-10-CM | POA: Diagnosis not present

## 2019-04-03 DIAGNOSIS — E039 Hypothyroidism, unspecified: Secondary | ICD-10-CM | POA: Insufficient documentation

## 2019-04-03 DIAGNOSIS — N3000 Acute cystitis without hematuria: Secondary | ICD-10-CM | POA: Diagnosis not present

## 2019-04-03 LAB — URINALYSIS, ROUTINE W REFLEX MICROSCOPIC
Bilirubin Urine: NEGATIVE
Glucose, UA: >=500 mg/dL — AB
Ketones, ur: 20 mg/dL — AB
Nitrite: NEGATIVE
Protein, ur: 30 mg/dL — AB
Specific Gravity, Urine: 1.011 (ref 1.005–1.030)
WBC, UA: >50 WBC/hpf — ABNORMAL HIGH (ref 0–5)
pH: 6 (ref 5.0–8.0)

## 2019-04-03 LAB — CBC
HCT: 44.3 % (ref 36.0–46.0)
Hemoglobin: 14.9 g/dL (ref 12.0–15.0)
MCH: 30.8 pg (ref 26.0–34.0)
MCHC: 33.6 g/dL (ref 30.0–36.0)
MCV: 91.7 fL (ref 80.0–100.0)
Platelets: 314 10*3/uL (ref 150–400)
RBC: 4.83 MIL/uL (ref 3.87–5.11)
RDW: 11.8 % (ref 11.5–15.5)
WBC: 9.6 10*3/uL (ref 4.0–10.5)
nRBC: 0 % (ref 0.0–0.2)

## 2019-04-03 LAB — BASIC METABOLIC PANEL
Anion gap: 9 (ref 5–15)
BUN: 8 mg/dL (ref 6–20)
CO2: 24 mmol/L (ref 22–32)
Calcium: 8.9 mg/dL (ref 8.9–10.3)
Chloride: 102 mmol/L (ref 98–111)
Creatinine, Ser: 0.47 mg/dL (ref 0.44–1.00)
GFR calc Af Amer: >60 mL/min (ref >60)
GFR calc non Af Amer: >60 mL/min (ref >60)
Glucose, Bld: 190 mg/dL — ABNORMAL HIGH (ref 70–99)
Potassium: 3.2 mmol/L — ABNORMAL LOW (ref 3.5–5.1)
Sodium: 135 mmol/L (ref 135–145)

## 2019-04-03 LAB — I-STAT BETA HCG BLOOD, ED (MC, WL, AP ONLY): I-stat hCG, quantitative: <5 m[IU]/mL (ref <5)

## 2019-04-03 LAB — CBG MONITORING, ED
Glucose-Capillary: 229 mg/dL — ABNORMAL HIGH (ref 70–99)
Glucose-Capillary: 177 mg/dL — ABNORMAL HIGH (ref 70–99)

## 2019-04-03 MED ORDER — POTASSIUM CHLORIDE CRYS ER 20 MEQ PO TBCR
20.0000 meq | EXTENDED_RELEASE_TABLET | Freq: Once | ORAL | Status: AC
  Administered 2019-04-03: 20 meq via ORAL
  Filled 2019-04-03: qty 1

## 2019-04-03 NOTE — ED Provider Notes (Signed)
[facility] DEPT Provider Note   CSN: [id number] Arrival date & time: 04/03/19  1206    History   Chief Complaint Chief Complaint  Patient presents with  . Hyperglycemia  . Exposure to STD    HPI Traci Rivas is a 28 y.o. female with PMHx type I DM who presents to the ED today from urgent care. Pt went to urgent care this morning after having urinary frequency and vaginal discharge; she was concerned she could have a UTI. Pt also had unprotected intercourse over the weekend and took plan B; she was tested for STIs at urgent care as well. Pt was found to have a UTI as well as a yeast infection; she was discharged home with diflucan as well as bactrim DS. While at urgent care she had 3+ glucose and 3+ ketones in her urine; the doctor at [facility] wanted patient to be evaluated to ensure she was not in DKA. Pt having no complains of abdominal pain, nausea, vomiting, shortness of breath, vision changes. She reports she wasn't as diligent this weekend with checking her blood glucose level but has not missed any doses of insulin. Her blood sugar was in the 400's when she woke up; she took her normal loading dose of insulin and had a sugar of 278 in UC.        Past Medical History:  Diagnosis Date  . Diabetes mellitus    type 1  . Dysmenorrhea   . Hypoglycemia associated with diabetes ([place])   . Hypothyroidism, acquired, autoimmune   . Thyroiditis, autoimmune     Patient Active Problem List   Diagnosis Date Noted  . Depression, major, single episode, moderate ([place]) 11/12/2018  . Anxiety 11/12/2018  . Elevated liver enzymes 12/08/2014  . NAFLD (nonalcoholic fatty liver disease) 12/08/2014  . Abdominal pain, epigastric 12/04/2014  . Wrist pain, right 03/15/2014  . Overweight 10/27/2013  . Non compliance with medical treatment 02/15/2013  . Type 1 diabetes mellitus not at goal [facility]) 10/11/2012  . Hypertension 10/11/2012  . Goiter 05/11/2012  .  Hypoglycemia associated with diabetes ([place])   . Hypothyroidism, acquired, autoimmune   . Hashimoto's thyroiditis 01/21/2011  . Otalgia 01/21/2011  . BACK PAIN, THORACIC REGION, LEFT 01/29/2010  . DYSMENORRHEA 05/31/2008    History reviewed. No pertinent surgical history.   OB History    Gravida  0   Para  0   Term  0   Preterm  0   AB  0   Living  0     SAB  0   TAB  0   Ectopic  0   Multiple  0   Live Births               Home Medications    Prior to Admission medications   Medication Sig Start Date End Date Taking? Authorizing Provider  BIOTIN PO Take 1 tablet by mouth daily.   Yes [provider]  escitalopram (LEXAPRO) 10 MG tablet Take 1 tablet (10 mg total) by mouth daily. 11/12/18  Yes Roma Schanz R, DO  glucagon 1 MG injection Inject 1 mg into the skin once as needed. Follow package directions for low blood sugar. 09/22/18  Yes Philemon Kingdom, MD  Insulin Aspart, w/Niacinamide, (FIASP FLEXTOUCH) 100 UNIT/ML SOPN Inject 10-14 Units into the skin 3 (three) times daily before meals. 09/22/18  Yes Philemon Kingdom, MD  Insulin Degludec (TRESIBA FLEXTOUCH) 200 UNIT/ML SOPN Inject 36 Units into the skin  daily. 09/22/18  Yes Philemon Kingdom, MD  naproxen sodium (ALEVE) 220 MG tablet Take 220 mg by mouth daily as needed (pain).   Yes [provider]  Vitamin D, Ergocalciferol, (DRISDOL) 1.25 MG (50000 UT) CAPS capsule Take 1 capsule (50,000 Units total) by mouth every 7 (seven) days. 11/17/18  Yes Roma Schanz R, DO  BAYER MICROLET LANCETS lancets Use as instructed to check sugar 3 times daily. 11/06/16   Philemon Kingdom, MD  Blood Glucose Monitoring Suppl (BAYER CONTOUR NEXT MONITOR) w/Device KIT Use to check sugar 3 times daily. 11/06/16   Philemon Kingdom, MD  cyanocobalamin (,VITAMIN B-12,) 1000 MCG/ML injection Inject 6m subcutaneously once weekly. Patient not taking: Reported on 04/03/2019 11/17/18   LRoma Schanz R, DO  cyanocobalamin (,VITAMIN B-12,) 1000 MCG/ML injection Inject 143msubcutaneously monthly. Please place on hold til patient finish weekly rx. Patient not taking: Reported on 04/03/2019 11/17/18   LoCarollee HerterYvAlferd ApaDO  diclofenac (VOLTAREN) 75 MG EC tablet Take 1 tablet (75 mg total) by mouth 2 (two) times daily. Patient not taking: Reported on 04/03/2019 03/14/19   HuDene GentryMD  fluconazole (DIFLUCAN) 150 MG tablet Take 150 mg by mouth once. 04/03/19   [provider]  glucose blood (BAYER CONTOUR NEXT TEST) test strip Use as instructed to check sugar 4 times daily. 09/22/18   GhPhilemon KingdomMD  sulfamethoxazole-trimethoprim (BACTRIM DS) 800-160 MG tablet Take 1 tablet by mouth 2 (two) times a day. 04/03/19   [provider]  Syringe/Needle, Disp, (SYRINGE 3CC/25GX5/8") 25G X 5/8" 3 ML MISC Use as directed with B12 injection 11/17/18   LoAnn HeldDO    Family History Family History  Problem Relation Age of Onset  . Diabetes Father   . Heart attack Paternal Grandfather   . Heart disease Paternal Grandfather   . Diabetes Sister   . Hypothyroidism Sister   . Cancer Paternal Grandmother        Colon    Social History Social History   Tobacco Use  . Smoking status: Never Smoker  . Smokeless tobacco: Never Used  Substance Use Topics  . Alcohol use: No    Alcohol/week: 0.0 standard drinks  . Drug use: No     Allergies   Amoxicillin and Penicillins   Review of Systems Review of Systems  Constitutional: Negative for chills and fever.  HENT: Negative for congestion.   Eyes: Negative for visual disturbance.  Respiratory: Negative for cough and shortness of breath.   Cardiovascular: Negative for chest pain.  Gastrointestinal: Negative for abdominal pain, constipation, diarrhea, nausea and vomiting.  Genitourinary: Positive for dysuria, frequency and vaginal discharge. Negative for pelvic pain and vaginal bleeding.  Musculoskeletal:  Negative for myalgias.  Skin: Negative for rash.  Neurological: Negative for dizziness and light-headedness.     Physical Exam Updated Vital Signs BP 117/82 (BP Location: Right Arm)   Pulse 92   Temp 98.3 F (36.8 C) (Oral)   Resp 16   Ht '5\' 2"'  (1.575 m)   Wt 76.2 kg   LMP 03/20/2019   SpO2 97%   BMI 30.73 kg/m   Physical Exam Vitals signs and nursing note reviewed.  Constitutional:      Appearance: She is not ill-appearing or diaphoretic.  HENT:     Head: Normocephalic and atraumatic.  Eyes:     Conjunctiva/sclera: Conjunctivae normal.  Neck:     Musculoskeletal: Neck supple.  Cardiovascular:     Rate and  Rhythm: Normal rate and regular rhythm.     Pulses: Normal pulses.  Pulmonary:     Effort: Pulmonary effort is normal.     Breath sounds: Normal breath sounds. No wheezing, rhonchi or rales.  Abdominal:     Palpations: Abdomen is soft.     Tenderness: There is no abdominal tenderness. There is no right CVA tenderness, left CVA tenderness, guarding or rebound.  Skin:    General: Skin is warm and dry.  Neurological:     Mental Status: She is alert.      ED Treatments / Results  Labs (all labs ordered are listed, but only abnormal results are displayed) Labs Reviewed  BASIC METABOLIC PANEL - Abnormal; Notable for the following components:      Result Value   Potassium 3.2 (*)    Glucose, Bld 190 (*)    All other components within normal limits  URINALYSIS, ROUTINE W REFLEX MICROSCOPIC - Abnormal; Notable for the following components:   APPearance CLOUDY (*)    Glucose, UA >=500 (*)    Hgb urine dipstick LARGE (*)    Ketones, ur 20 (*)    Protein, ur 30 (*)    Leukocytes,Ua LARGE (*)    WBC, UA >50 (*)    Bacteria, UA MANY (*)    Non Squamous Epithelial 0-5 (*)    All other components within normal limits  CBG MONITORING, ED - Abnormal; Notable for the following components:   Glucose-Capillary 229 (*)    All other components within normal limits   CBG MONITORING, ED - Abnormal; Notable for the following components:   Glucose-Capillary 177 (*)    All other components within normal limits  CBC  I-STAT BETA HCG BLOOD, ED ([facility], [facility], [facility] ONLY)    EKG None  Radiology No results found.  Procedures Procedures (including critical care time)  Medications Ordered in ED Medications  potassium chloride SA (K-DUR) CR tablet 20 mEq (20 mEq Oral Given 04/03/19 1430)     Initial Impression / Assessment and Plan / ED Course  I have reviewed the triage vital signs and the nursing notes.  Pertinent labs & imaging results that were available during my care of the patient were reviewed by me and considered in my medical decision making (see chart for details).    Pt is a 28 year old female with type I DM who presents from urgent care after having ketones in her urine. Initially went for UTI like symptoms including frequency and vaginal discharge. Had pelvic exam done at [facility] with positive yeast on wet prep; GC chlamydia tests pending. UTI found on U/A; patient also found to have 3+ glucose and 3+ ketones; sent to ED for further evaluation. Pt not complaining of nausea, vomiting, abdominal pain, SOB. Sitting comfortably in bed. Has not missed any doses of insulin. Very low suspicion for DKA but Urgent wanted her sent over to check kidney function.   Labs were ordered in triage including CBC, BMP, CBG, U/A, and beta HCG. All labwork reassuring prior to eval; mild hypokalemia at 3.2 that was replenished in the ED; glucose of 190 on BMP and FSBG 177. Initially 400 this AM when patient woke up; states she has been taking her insulin but was not as diligent this weekend about checking her blood glucose level. No anion gap to suggest DKA today. CBC unremarkable as well. Awaiting U/A but likely patient will be discharged home with PCP follow up.   U/A with 20 ketones; not  very significant amount. Pt does have > 500 glucose in her urine; will have her follow up  with endocrinologist regarding this. She reports it has been sometime since she has gotten her A1c checked. U/A does show UTI; patient was discharged at urgent care with Bactrim as well as Diflucan for yeast infection. Advised to pick this up at the pharmacy. Patient is in agreement with plan at this time and stable for discharge home.        Final Clinical Impressions(s) / ED Diagnoses   Final diagnoses:  Acute cystitis without hematuria  Hypokalemia    ED Discharge Orders    None       Eustaquio Maize, PA-C 04/03/19 1624    Lacretia Leigh, MD 04/05/19 [phone]

## 2019-04-03 NOTE — ED Notes (Signed)
She continues to feel well. She has no requests at this time.

## 2019-04-03 NOTE — Discharge Instructions (Signed)
You were sent to the ED today after being seen by urgent care; your labwork was reassuring today. Your kidney function is normal. You did have a low potassium today; you were given potassium in the ED. Attached is a resource guide about potassium rich foods to eat. Please pick up your prescriptions that were prescribed at urgent care for your UTI and yeast infection. Your urine today showed a lot of glucose; please follow up with your endocrinologist regarding this. Return to the ED for any worsening symptoms.

## 2019-04-03 NOTE — ED Triage Notes (Addendum)
Pt states she was seen at UC earlier and referred here. UC found ketones in her urine and told her to come for eval as she is a type 1 diabetic. Pt uses flex pens. Has not eaten today. This morning on waking FSBG 404   PT was dx with yeast infection and UTI at [facility]. Pt states back pain. Pt requesting STD test- she took plan B last night.

## 2019-04-03 NOTE — ED Notes (Signed)
Urine culture sent down to lab with urinalysis. 

## 2019-04-11 ENCOUNTER — Ambulatory Visit: Payer: BLUE CROSS/BLUE SHIELD | Admitting: Family Medicine

## 2019-04-21 ENCOUNTER — Other Ambulatory Visit: Payer: Self-pay | Admitting: Family Medicine

## 2019-04-21 DIAGNOSIS — F419 Anxiety disorder, unspecified: Secondary | ICD-10-CM

## 2019-04-21 DIAGNOSIS — F321 Major depressive disorder, single episode, moderate: Secondary | ICD-10-CM

## 2019-05-06 ENCOUNTER — Telehealth: Payer: Self-pay | Admitting: Family Medicine

## 2019-05-06 NOTE — Telephone Encounter (Signed)
Pt stated she went to a UC and was diagnosed with a kidney infection and given antibiotics. She would like to know if Dr. Etter Sjogren will send in rx for another round incase she needs it over the weekend. Advised that appt may be needed first. Please advise.   [facility] ([address] [place] [phone] (Phone) [phone] (Fax)

## 2019-06-02 ENCOUNTER — Other Ambulatory Visit: Payer: Self-pay | Admitting: Internal Medicine

## 2019-06-02 DIAGNOSIS — E109 Type 1 diabetes mellitus without complications: Secondary | ICD-10-CM

## 2019-06-09 ENCOUNTER — Telehealth: Payer: Self-pay | Admitting: *Deleted

## 2019-06-09 NOTE — Telephone Encounter (Signed)
Pt stated that her school would not accept the record unless it was faxed from practice. Requesting it be faxed to [phone] ATTN: [facility]. Please advise.

## 2019-06-09 NOTE — Telephone Encounter (Signed)
Immunization report printed and placed at front desk for pick up. Attempted to notify pt but voicemail box was full.  Copied from [place] [phone]. Topic: General - Other >> Jun 09, 2019  8:21 AM Leward Quan A wrote: Reason for CRM: Patient called to request a copy of her immunization record for pick up today please. Please call Ph#  [phone] when its ready

## 2019-06-10 NOTE — Telephone Encounter (Signed)
[facility] printed and faxed. Received confirmation.

## 2019-08-22 ENCOUNTER — Telehealth: Payer: Self-pay | Admitting: Internal Medicine

## 2019-08-22 NOTE — Telephone Encounter (Signed)
Last office visit 09/22/2018  Cancel/No-show? One  Future office visit scheduled? None.   Please contact patient to set up appointment, once she is scheduled please route back so we can send enough to hold her over until her appointment.

## 2019-08-22 NOTE — Telephone Encounter (Signed)
MEDICATION: TRESIBA FLEXTOUCH 200 UNIT/ML SOPN  PHARMACY:  [facility] in [place] :   IS PATIENT OUT OF MEDICATION: NO  IF NOT; HOW MUCH IS LEFT:   LAST APPOINTMENT DATE: @8 /03/2019  NEXT APPOINTMENT DATE:@Visit  date not found  DO WE HAVE YOUR PERMISSION TO LEAVE A DETAILED MESSAGE: yes [phone]  OTHER COMMENTS:    **Let patient know to contact pharmacy at the end of the day to make sure medication is ready. **  ** Please notify patient to allow 48-72 hours to process**  **Encourage patient to contact the pharmacy for refills or they can request refills through [facility]**

## 2019-08-23 NOTE — Telephone Encounter (Signed)
Virtual Visit scheduled for this coming Tuesday 08/30/2019 at 8am - patient is student in [place], [place] and [place] be back in [place] until mid-December.

## 2019-08-23 NOTE — Telephone Encounter (Signed)
LMTCB to schedule appointment °

## 2019-08-24 ENCOUNTER — Telehealth: Payer: Self-pay | Admitting: Internal Medicine

## 2019-08-24 ENCOUNTER — Other Ambulatory Visit: Payer: Self-pay

## 2019-08-24 DIAGNOSIS — E109 Type 1 diabetes mellitus without complications: Secondary | ICD-10-CM

## 2019-08-24 MED ORDER — TRESIBA FLEXTOUCH 200 UNIT/ML ~~LOC~~ SOPN: 36.0000 [IU] | PEN_INJECTOR | Freq: Every day | SUBCUTANEOUS | 0 refills | Status: DC

## 2019-08-24 NOTE — Telephone Encounter (Signed)
Patient requests the Office Manager Colletta [place] call her at ph# [phone] re: problem she is having within our office

## 2019-08-25 NOTE — Telephone Encounter (Signed)
LM for pt to call me back. 

## 2019-08-30 ENCOUNTER — Other Ambulatory Visit: Payer: Self-pay

## 2019-08-30 ENCOUNTER — Encounter: Payer: Self-pay | Admitting: Internal Medicine

## 2019-08-30 ENCOUNTER — Ambulatory Visit (INDEPENDENT_AMBULATORY_CARE_PROVIDER_SITE_OTHER): Payer: BC Managed Care – PPO | Admitting: Internal Medicine

## 2019-08-30 DIAGNOSIS — E109 Type 1 diabetes mellitus without complications: Secondary | ICD-10-CM | POA: Diagnosis not present

## 2019-08-30 MED ORDER — FIASP FLEXTOUCH 100 UNIT/ML ~~LOC~~ SOPN: 14.0000 [IU] | PEN_INJECTOR | Freq: Three times a day (TID) | SUBCUTANEOUS | 5 refills | Status: DC

## 2019-08-30 MED ORDER — TRESIBA FLEXTOUCH 200 UNIT/ML ~~LOC~~ SOPN: 36.0000 [IU] | PEN_INJECTOR | Freq: Every day | SUBCUTANEOUS | 3 refills | Status: DC

## 2019-08-30 MED ORDER — METFORMIN HCL 1000 MG PO TABS: 1000.0000 mg | ORAL_TABLET | Freq: Every day | ORAL | 3 refills | Status: DC

## 2019-08-30 MED ORDER — GLUCAGON (RDNA) 1 MG IJ KIT: 1.0000 mg | PACK | Freq: Once | INTRAMUSCULAR | 11 refills | Status: AC | PRN

## 2019-08-30 NOTE — Progress Notes (Signed)
Patient ID: Traci Rivas, female   DOB: 1991/06/06, 28 y.o.   MRN: [id number]  Patient location: Home My location: Office  Referring Provider: Ann Held, DO  I connected with the patient on 08/30/19 at  8:02 AM EST by a video enabled telemedicine application and verified that I am speaking with the correct person.   I discussed the limitations of evaluation and management by telemedicine and the availability of in person appointments. The patient expressed understanding and agreed to proceed.   Details of the encounter are shown below.  HPI: Traci Rivas is a 28 y.o.-year-old female, initially referred by her Traci Alas, NP Traci Rivas), presenting for follow-up for DM1, diagnosed in 2010 (age 52), uncontrolled, without complications and Hashimoto's hypothyroidism.  Last visit a year ago, previous visit 1 year and 3 months prior.  She is not usually compliant with appointments, sugar checks, insulin dosing.  Last hemoglobin A1c was: Lab Results  Component Value Date   HGBA1C 10.6 (A) 09/22/2018   HGBA1C 10.0 06/25/2017   HGBA1C 10.7 10/30/2016   Pt was on: - Lantus 36 units at bedtime. - NovoLog - inject 15 min before a meal: - ICR: 15 >> 10 (still using 15!) - 10-17 units per meal - skips b'fast usually - target: 150 >> 120 - ISF: 50  At last visit, we changed to: - Lantus >> Tresiba 36 units at bedtime. - Humalog >> FiAsp 10-14 units most before the meals as she was taking them after the meal at last visit Not using:  Pt checks her sugars 2 times a day: - am:150-200 >> 83, 87-244, 310 >> 80-200s >> 100, 120-140, 200 (if eats late) - 2h after b'fast: n/c >> 273-339 >> n/c - before lunch: 200s >> 481 >> 200s >> n/c - 2h after lunch: n/c >> 203, 272 >> n/c - before dinner: 89-199 >> 100s-200s >> 150-250 - 2h after dinner:  107-303>> n/c - bedtime: 150-200 >> 83-389 >> n/c - nighttime: 50s-70s >> n/c >> 104-319 >> 100s-? Lowest sugar was 52 (overbolused for  lunch) >> 83 >> 56 (nighttime) >> 50s x1 (injected too much insulin with meal); she has hypoglycemia awareness in the 80s.  No previous hypoglycemia admission.  She has a nonexpired glucagon kit at home Highest sugar was 500s >> 400s (off [place]).  No previous DKA admissions.  -No CKD, last BUN/creatinine:  Lab Results  Component Value Date   BUN 8 04/03/2019   CREATININE 0.47 04/03/2019  Previously on lisinopril, now  Normal ACR Lab Results  Component Value Date   MICRALBCREAT 0.7 09/22/2018   MICRALBCREAT 0.7 06/25/2017   MICRALBCREAT 0.9 03/13/2016   MICRALBCREAT 2.5 05/02/2013   MICRALBCREAT 2.8 10/11/2012   -+ Dyslipidemia. Last set of lipids: Lab Results  Component Value Date   CHOL 222 (H) 09/22/2018   HDL 44.20 09/22/2018   LDLCALC 92 06/25/2017   LDLDIRECT 152.0 09/22/2018   TRIG 219.0 (H) 09/22/2018   CHOLHDL 5 09/22/2018   - last eye exam was in 07/2018: No DR -She denies numbness and tingling in her feet.  Hashimoto's hypothyroidism: - prev. on levothyroxine generic (changed from brand name) 37.5 mcg daily >> ran out 2 months prior to our last visit  Latest TSH was slightly high off levothyroxine: Lab Results  Component Value Date   TSH 4.65 (H) 11/12/2018   ROS: Constitutional: + weight gain/+ weight loss, no fatigue, no subjective hyperthermia, no subjective hypothermia Eyes: no blurry vision, no xerophthalmia  ENT: no sore throat, no nodules palpated in neck, no dysphagia, no odynophagia, no hoarseness Cardiovascular: no CP/no SOB/no palpitations/no leg swelling Respiratory: no cough/no SOB/no wheezing Gastrointestinal: no N/no V/no D/no C/no acid reflux Musculoskeletal: no muscle aches/no joint aches Skin: no rashes, no hair loss Neurological: no tremors/no numbness/no tingling/no dizziness  I reviewed pt's medications, allergies, PMH, social hx, family hx, and changes were documented in the history of present illness. Otherwise, unchanged from my  initial visit note.  Past Medical History:  Diagnosis Date  . Diabetes mellitus    type 1  . Dysmenorrhea   . Hypoglycemia associated with diabetes (Traci Rivas)   . Hypothyroidism, acquired, autoimmune   . Thyroiditis, autoimmune    No past surgical history on file. Social History   Social History  . Marital Status: Single    Spouse Name: N/A  . Number of Children: 0   Occupational History  . Not on file.   Social History Main Topics  . Smoking status: Never Smoker   . Smokeless tobacco: Never Used  . Alcohol Use: No  . Drug Use: No  . Sexual Activity: Not Currently    Birth Control/ Protection: Pill   Current Outpatient Medications on File Prior to Visit  Medication Sig Dispense Refill  . BAYER MICROLET LANCETS lancets Use as instructed to check sugar 3 times daily. 200 each 5  . BIOTIN PO Take 1 tablet by mouth daily.    . Blood Glucose Monitoring Suppl (BAYER CONTOUR NEXT MONITOR) w/Device KIT Use to check sugar 3 times daily. 1 kit 0  . escitalopram (LEXAPRO) 10 MG tablet Take 1 tablet by mouth once daily 30 tablet 4  . fluconazole (DIFLUCAN) 150 MG tablet Take 150 mg by mouth once.    Marland Kitchen glucagon 1 MG injection Inject 1 mg into the skin once as needed. Follow package directions for low blood sugar. 2 each 4  . glucose blood (BAYER CONTOUR NEXT TEST) test strip Use as instructed to check sugar 4 times daily. 300 each 5  . Insulin Aspart, w/Niacinamide, (FIASP FLEXTOUCH) 100 UNIT/ML SOPN Inject 10-14 Units into the skin 3 (three) times daily before meals. 30 mL 5  . Insulin Degludec (TRESIBA FLEXTOUCH) 200 UNIT/ML SOPN Inject 36 Units into the skin daily. 6 mL 0  . naproxen sodium (ALEVE) 220 MG tablet Take 220 mg by mouth daily as needed (pain).    Marland Kitchen sulfamethoxazole-trimethoprim (BACTRIM DS) 800-160 MG tablet Take 1 tablet by mouth 2 (two) times a day.    . Syringe/Needle, Disp, (SYRINGE 3CC/25GX5/8") 25G X 5/8" 3 ML MISC Use as directed with B12 injection 10 each 5  .  Vitamin D, Ergocalciferol, (DRISDOL) 1.25 MG (50000 UT) CAPS capsule Take 1 capsule (50,000 Units total) by mouth every 7 (seven) days. 12 capsule 0   No current facility-administered medications on file prior to visit.    Allergies  Allergen Reactions  . Amoxicillin Rash    Did it involve swelling of the face/tongue/throat, SOB, or low BP? n Did it involve sudden or severe rash/hives, skin peeling, or any reaction on the inside of your mouth or nose? n Did you need to seek medical attention at a hospital or doctor's office? n When did it last happen?had reaction as a child If all above answers are "NO", may proceed with cephalosporin use.  Marland Kitchen Penicillins Rash   Family History  Problem Relation Age of Onset  . Diabetes Father   . Heart attack Paternal Grandfather   .  Heart disease Paternal Grandfather   . Diabetes Sister   . Hypothyroidism Sister   . Cancer Paternal Grandmother        Colon   Pt has FH of DM1 in sister.  PE: There were no vitals taken for this visit. Wt Readings from Last 3 Encounters:  04/03/19 168 lb (76.2 kg)  03/14/19 165 lb (74.8 kg)  11/12/18 177 lb 6.4 oz (80.5 kg)   Constitutional:  in NAD  The physical exam was not performed (virtual visit).  ASSESSMENT: 1. DM1, uncontrolled, without complications  2. Hashimoto's hypothyroidism  PLAN:  1. Patient with longstanding, uncontrolled, type 1 diabetes, on basal-bolus insulin regimen, returning after another long absence.  Her latest HbA1c was 10.6% a year ago.  This is a virtual visit so we cannot check her HbA1c today. -At last visit, sugars are very variable, without any pattern.  In the past, I referred her to nutrition as she was not very proficient at counting carbs, but she did not schedule this appointment.  She was also missing insulin doses in the past.  She does not usually bring a log or meter to her appointments.  At last visit she was taking insulin doses after meals, rather than before and  we discussed the importance of moving these before meals.  To improve her compliance I switched her to Fiasp insulin which can be taken at the start of the meal.  We also changed from [place], which is longer acting and offers more stable control of blood sugars.  I could not make further changes in her regimen since I did not have a log or meter and she was not taking her insulin consistently at last visit -Her sister was started on a [employer] insulin pump last year and she was contemplating this, also -At this visit, sugars are still variable but she is reporting better compliance with her insulin.  She is currently in school and does not have organized mealtimes.  For now, we discussed about increasing her Fiasp slightly but will also add Metformin 1000 mg with dinner.  I explained that she needs to stop the medication if she becomes dehydrated for any reason.  We will start at a low dose and increase as tolerated.  We discussed about benefits and possible side effects from it. - I advised her to: Patient Instructions  Please continue: - Tresiba U200 36 units at bedtime.  Please increase: - FiAsp 14-18 units before a meal  Please start: - Metformin 500 mg with dinner x 4 days. If you tolerate this well, increase to 1000 mg with dinner.  Please return in 4 months with your meter.  - we would check her HbA1c at next visit - advised to check sugars at different times of the day - 3-4x a day, rotating check times - advised for yearly eye exams >> she is not UTD - she will have labs by PCP at next annual physical exam - return to clinic in 4 months    2. Hashimoto's hypothyroidism -Noncompliant with levothyroxine in the past - she is not on levothyroxine anymore - latest thyroid labs reviewed with pt >> normal 10/2018 off levothyroxine - pt does not report fatigue.  She was able to lose weight in the spring, but she did gain weight during the coronavirus pandemic.  - time spent with  the patient: 25 minutes, of which >50% was spent in obtaining information about her symptoms, reviewing her previous labs, evaluations, and treatments, counseling  her about her condition (please see the discussed topics above), and developing a plan to further investigate and treat it; she had a number of questions which I addressed.  Philemon Kingdom, MD PhD [facility]

## 2019-08-30 NOTE — Patient Instructions (Addendum)
Please continue: - Tresiba U200 36 units at bedtime.  Please increase: - FiAsp 14-18 units before a meal  Please start: - Metformin 500 mg with dinner x 4 days. If you tolerate this well, increase to 1000 mg with dinner.  Please return in 4 months with your meter.

## 2019-09-30 ENCOUNTER — Telehealth: Payer: Self-pay | Admitting: Internal Medicine

## 2019-09-30 DIAGNOSIS — E109 Type 1 diabetes mellitus without complications: Secondary | ICD-10-CM

## 2019-09-30 MED ORDER — FIASP FLEXTOUCH 100 UNIT/ML ~~LOC~~ SOPN: 14.0000 [IU] | PEN_INJECTOR | Freq: Three times a day (TID) | SUBCUTANEOUS | 5 refills | Status: DC

## 2019-09-30 NOTE — Telephone Encounter (Signed)
Patient decided to contact pharmacy since there were refills available for both RX that she call to fill -

## 2019-09-30 NOTE — Telephone Encounter (Signed)
[facility] in [place] ph# [phone] called re: patient is out of refills and PHARM requests a RX for:  Insulin Aspart, w/Niacinamide, (FIASP FLEXTOUCH) 100 UNIT/ML SOPN 30 mL 5 08/30/2019    Sig - Route: Inject 14-18 Units into the skin 3 (three) times daily before meals. - Subcutaneous   Class: No Print    Be sent to:  [facility] [place] [facility] [place] [phone] (Phone) [phone] (Fax)

## 2019-09-30 NOTE — Telephone Encounter (Signed)
RX sent

## 2019-09-30 NOTE — Telephone Encounter (Signed)
MEDICATION:   PHARMACY:    IS THIS A 90 DAY SUPPLY :   IS PATIENT OUT OF MEDICATION:   IF NOT; HOW MUCH IS LEFT:   LAST APPOINTMENT DATE: @11 /12/2018  NEXT APPOINTMENT DATE:@Visit  date not found  DO WE HAVE YOUR PERMISSION TO LEAVE A DETAILED MESSAGE:  OTHER COMMENTS:    **Let patient know to contact pharmacy at the end of the day to make sure medication is ready. **  ** Please notify patient to allow 48-72 hours to process**  **Encourage patient to contact the pharmacy for refills or they can request refills through [facility]**

## 2019-10-27 ENCOUNTER — Other Ambulatory Visit: Payer: Self-pay

## 2019-10-31 ENCOUNTER — Ambulatory Visit (INDEPENDENT_AMBULATORY_CARE_PROVIDER_SITE_OTHER): Payer: BC Managed Care – PPO | Admitting: Family Medicine

## 2019-10-31 ENCOUNTER — Other Ambulatory Visit: Payer: Self-pay

## 2019-10-31 ENCOUNTER — Encounter: Payer: Self-pay | Admitting: Family Medicine

## 2019-10-31 VITALS — BP 120/80 | HR 99 | Temp 97.8°F | Resp 18 | Ht 62.0 in | Wt 188.0 lb

## 2019-10-31 DIAGNOSIS — E109 Type 1 diabetes mellitus without complications: Secondary | ICD-10-CM

## 2019-10-31 DIAGNOSIS — N649 Disorder of breast, unspecified: Secondary | ICD-10-CM

## 2019-10-31 DIAGNOSIS — E063 Autoimmune thyroiditis: Secondary | ICD-10-CM

## 2019-10-31 DIAGNOSIS — L988 Other specified disorders of the skin and subcutaneous tissue: Secondary | ICD-10-CM | POA: Insufficient documentation

## 2019-10-31 NOTE — Progress Notes (Signed)
Patient ID: Traci Rivas, female    DOB: 07/13/1991  Age: 28 y.o. MRN: [id number]    Subjective:  Subjective  HPI Traci Rivas presents for lesion on L breast close to nipple that is now gone.  No other complaints.  Pt will schedule cpe.    Review of Systems  Constitutional: Negative for appetite change, diaphoresis, fatigue and unexpected weight change.  Eyes: Negative for pain, redness and visual disturbance.  Respiratory: Negative for cough, chest tightness, shortness of breath and wheezing.   Cardiovascular: Negative for chest pain, palpitations and leg swelling.  Endocrine: Negative for cold intolerance, heat intolerance, polydipsia, polyphagia and polyuria.  Genitourinary: Negative for difficulty urinating, dysuria and frequency.  Neurological: Negative for dizziness, light-headedness, numbness and headaches.    History Past Medical History:  Diagnosis Date  . Diabetes mellitus    type 1  . Dysmenorrhea   . Hypoglycemia associated with diabetes (HCC)   . Hypothyroidism, acquired, autoimmune   . Thyroiditis, autoimmune     She has no past surgical history on file.   Her family history includes Cancer in her paternal grandmother; Diabetes in her father and sister; Heart attack in her paternal grandfather; Heart disease in her paternal grandfather; Hypothyroidism in her sister.She reports that she has never smoked. She has never used smokeless tobacco. She reports that she does not drink alcohol or use drugs.  Current Outpatient Medications on File Prior to Visit  Medication Sig Dispense Refill  . BAYER MICROLET LANCETS lancets Use as instructed to check sugar 3 times daily. 200 each 5  . BIOTIN PO Take 1 tablet by mouth daily.    . Blood Glucose Monitoring Suppl (BAYER CONTOUR NEXT MONITOR) w/Device KIT Use to check sugar 3 times daily. 1 kit 0  . escitalopram (LEXAPRO) 10 MG tablet Take 1 tablet by mouth once daily 30 tablet 4  . glucagon 1 MG injection Inject 1  mg into the skin once as needed. Follow package directions for low blood sugar. 1 each 11  . glucose blood (BAYER CONTOUR NEXT TEST) test strip Use as instructed to check sugar 4 times daily. 300 each 5  . Insulin Aspart, w/Niacinamide, (FIASP FLEXTOUCH) 100 UNIT/ML SOPN Inject 14-18 Units into the skin 3 (three) times daily before meals. 30 mL 5  . Insulin Degludec (TRESIBA FLEXTOUCH) 200 UNIT/ML SOPN Inject 36 Units into the skin daily. 9 pen 3  . metFORMIN (GLUCOPHAGE) 1000 MG tablet Take 1 tablet (1,000 mg total) by mouth daily with supper. 90 tablet 3  . naproxen sodium (ALEVE) 220 MG tablet Take 220 mg by mouth daily as needed (pain).    . Syringe/Needle, Disp, (SYRINGE 3CC/25GX5/8") 25G X 5/8" 3 ML MISC Use as directed with B12 injection 10 each 5  . Vitamin D, Ergocalciferol, (DRISDOL) 1.25 MG (50000 UT) CAPS capsule Take 1 capsule (50,000 Units total) by mouth every 7 (seven) days. 12 capsule 0   No current facility-administered medications on file prior to visit.     Objective:  Objective  Physical Exam Vitals and nursing note reviewed.  Constitutional:      Appearance: She is well-developed.  HENT:     Head: Normocephalic and atraumatic.  Eyes:     Conjunctiva/sclera: Conjunctivae normal.  Neck:     Thyroid: No thyromegaly.     Vascular: No carotid bruit or JVD.  Cardiovascular:     Rate and Rhythm: Normal rate and regular rhythm.     Heart sounds: Normal heart sounds.   No murmur.  Pulmonary:     Effort: Pulmonary effort is normal. No respiratory distress.     Breath sounds: Normal breath sounds. No wheezing or rales.  Chest:     Chest wall: No tenderness.     Breasts: Breasts are symmetrical.        Right: Normal. No swelling, bleeding, inverted nipple, mass, nipple discharge, skin change or tenderness.        Left: Normal. No swelling, bleeding, inverted nipple, mass, nipple discharge, skin change or tenderness.  Musculoskeletal:     Cervical back: Normal range of  motion and neck supple.  Lymphadenopathy:     Upper Body:     Right upper body: No axillary adenopathy.     Left upper body: No axillary adenopathy.  Neurological:     Mental Status: She is alert and oriented to person, place, and time.    BP 120/80 (BP Location: Left Arm, Patient Position: Sitting, Cuff Size: Normal)   Pulse 99   Temp 97.8 F (36.6 C) (Temporal)   Resp 18   Ht 5' 2" (1.575 m)   Wt 188 lb (85.3 kg)   SpO2 99%   BMI 34.39 kg/m  Wt Readings from Last 3 Encounters:  10/31/19 188 lb (85.3 kg)  04/03/19 168 lb (76.2 kg)  03/14/19 165 lb (74.8 kg)     Lab Results  Component Value Date   WBC 9.6 04/03/2019   HGB 14.9 04/03/2019   HCT 44.3 04/03/2019   PLT 314 04/03/2019   GLUCOSE 190 (H) 04/03/2019   CHOL 222 (H) 09/22/2018   TRIG 219.0 (H) 09/22/2018   HDL 44.20 09/22/2018   LDLDIRECT 152.0 09/22/2018   LDLCALC 92 06/25/2017   ALT 24 09/22/2018   AST 24 09/22/2018   NA 135 04/03/2019   K 3.2 (L) 04/03/2019   CL 102 04/03/2019   CREATININE 0.47 04/03/2019   BUN 8 04/03/2019   CO2 24 04/03/2019   TSH 4.65 (H) 11/12/2018   HGBA1C 10.6 (A) 09/22/2018   MICROALBUR 1.0 09/22/2018    No results found.   Assessment & Plan:  Plan  I have discontinued Traci Rivas's fluconazole and sulfamethoxazole-trimethoprim. I am also having her maintain her Bayer Contour Next Monitor, Bayer Microlet Lancets, glucose blood, Vitamin D (Ergocalciferol), SYRINGE 3CC/25GX5/8", BIOTIN PO, naproxen sodium, escitalopram, metFORMIN, glucagon, Tresiba FlexTouch, and Fiasp FlexTouch.  No orders of the defined types were placed in this encounter.   Problem List Items Addressed This Visit      Unprioritized   Hypothyroidism, acquired, autoimmune    Per endo      Skin lesion of breast - Primary    Resolved rto if it returns rto cpe       Type 1 diabetes mellitus not at goal (HCC)    Per endo          Follow-up: Return in about 6 months (around 04/29/2020),  or if symptoms worsen or fail to improve, for annual exam, fasting.  Yvonne R Lowne Chase, DO     

## 2019-10-31 NOTE — Assessment & Plan Note (Signed)
Per endo °

## 2019-10-31 NOTE — Assessment & Plan Note (Signed)
Resolved rto if it returns rto cpe

## 2019-11-10 ENCOUNTER — Telehealth: Payer: Self-pay | Admitting: Family Medicine

## 2019-11-10 NOTE — Telephone Encounter (Signed)
Patient called and would like to speak with Dr. Laury Axon or her CMA about her testing positive for covid and her health issues. She wants to know what she needs to do. Please call patient back, thanks.

## 2019-11-11 ENCOUNTER — Encounter: Payer: Self-pay | Admitting: Family Medicine

## 2019-11-11 ENCOUNTER — Ambulatory Visit (INDEPENDENT_AMBULATORY_CARE_PROVIDER_SITE_OTHER): Payer: BC Managed Care – PPO | Admitting: Family Medicine

## 2019-11-11 ENCOUNTER — Other Ambulatory Visit: Payer: Self-pay

## 2019-11-11 DIAGNOSIS — U071 COVID-19: Secondary | ICD-10-CM | POA: Diagnosis not present

## 2019-11-11 DIAGNOSIS — R21 Rash and other nonspecific skin eruption: Secondary | ICD-10-CM | POA: Diagnosis not present

## 2019-11-11 MED ORDER — TRIAMCINOLONE ACETONIDE 0.1 % EX CREA: 1.0000 "application " | TOPICAL_CREAM | Freq: Two times a day (BID) | CUTANEOUS | 0 refills | Status: DC

## 2019-11-11 NOTE — Progress Notes (Signed)
Virtual Visit via Video Note  I connected with Traci Rivas on 11/11/19 at  1:00 PM EST by a video enabled telemedicine application and verified that I am speaking with the correct person using two identifiers.  Location: Patient: home alone  Provider: home    I discussed the limitations of evaluation and management by telemedicine and the availability of in person appointments. The patient expressed understanding and agreed to proceed.  History of Present Illness: Pt is home and tested positive for covid this week and has a rash under  both arms.  She started having symptoms mondays and then her boss called her and told her she tested positive so she got tested Wednesday -- rapid test and it was positive  Rash started that days as well --- it is very itchy She has not tried anything on it  Observations/Objective: There were no vitals filed for this visit. No fevers  Rash seen under both arms--- papular errythematous   Assessment and Plan: .1. Rash Steroid cream per orders Call if symptoms do not resolve or if they worsen  - triamcinolone cream (KENALOG) 0.1 %; Apply 1 application topically 2 (two) times daily.  Dispense: 30 g; Refill: 0  2. COVID-19 virus detected Pt only symptom is fatigue and rash Pt instructed to be seen in UC or er if symptoms worsen  Follow Up Instructions:    I discussed the assessment and treatment plan with the patient. The patient was provided an opportunity to ask questions and all were answered. The patient agreed with the plan and demonstrated an understanding of the instructions.   The patient was advised to call back or seek an in-person evaluation if the symptoms worsen or if the condition fails to improve as anticipated.    Donato Schultz, DO

## 2019-11-14 ENCOUNTER — Telehealth: Payer: Self-pay | Admitting: *Deleted

## 2019-11-14 NOTE — Telephone Encounter (Signed)
Patient spoke with overnight nurse and and went to ER.  She is feeling ok now.  She will call us if she need [place].

## 2019-11-14 NOTE — Telephone Encounter (Signed)
Contact Type Call Who Is Calling Patient / Member / Family / Caregiver Call Type Triage / Clinical Relationship To Patient Self Return Phone Number [phone] (Primary) Chief Complaint Headache Reason for Call Symptomatic / Request for Health Information Initial Comment Caller states she has covid for about a week and is having more sx. Last night she got a headache that has lasted all day and made her vomit. Denies any breathing issues but is diabetic. Translation No Nurse Assessment Nurse: Dorathy Daft, RN, Misty Stanley Date/Time (Eastern Time): 11/13/2019 4:10:50 PM Confirm and document reason for call. If symptomatic, describe symptoms. ---Caller states she has covid for about a week and is having more sx. Last night she got a headache that has lasted all day and made her vomit X 1. Denies any breathing issues but is diabetic.

## 2020-05-01 ENCOUNTER — Ambulatory Visit: Payer: BC Managed Care – PPO | Admitting: Family Medicine

## 2020-05-02 ENCOUNTER — Other Ambulatory Visit: Payer: Self-pay | Admitting: Internal Medicine

## 2020-05-02 DIAGNOSIS — E109 Type 1 diabetes mellitus without complications: Secondary | ICD-10-CM

## 2020-05-10 ENCOUNTER — Encounter: Payer: Self-pay | Admitting: Family Medicine

## 2020-05-10 ENCOUNTER — Other Ambulatory Visit: Payer: Self-pay

## 2020-05-10 ENCOUNTER — Other Ambulatory Visit (HOSPITAL_COMMUNITY)
Admission: RE | Admit: 2020-05-10 | Discharge: 2020-05-10 | Disposition: A | Discharge status: Home / Self Care | Payer: BC Managed Care – PPO | Source: Ambulatory Visit | Attending: Family Medicine | Admitting: Family Medicine

## 2020-05-10 ENCOUNTER — Ambulatory Visit (INDEPENDENT_AMBULATORY_CARE_PROVIDER_SITE_OTHER): Payer: BC Managed Care – PPO | Admitting: Family Medicine

## 2020-05-10 VITALS — BP 100/80 | HR 97 | Temp 98.2°F | Resp 18 | Ht 62.0 in | Wt 188.6 lb

## 2020-05-10 DIAGNOSIS — Z0000 Encounter for general adult medical examination without abnormal findings: Secondary | ICD-10-CM

## 2020-05-10 DIAGNOSIS — F321 Major depressive disorder, single episode, moderate: Secondary | ICD-10-CM | POA: Diagnosis not present

## 2020-05-10 DIAGNOSIS — F419 Anxiety disorder, unspecified: Secondary | ICD-10-CM

## 2020-05-10 DIAGNOSIS — Z01419 Encounter for gynecological examination (general) (routine) without abnormal findings: Secondary | ICD-10-CM | POA: Insufficient documentation

## 2020-05-10 DIAGNOSIS — Z30019 Encounter for initial prescription of contraceptives, unspecified: Secondary | ICD-10-CM | POA: Diagnosis not present

## 2020-05-10 DIAGNOSIS — E063 Autoimmune thyroiditis: Secondary | ICD-10-CM | POA: Diagnosis not present

## 2020-05-10 DIAGNOSIS — E109 Type 1 diabetes mellitus without complications: Secondary | ICD-10-CM

## 2020-05-10 DIAGNOSIS — R140 Abdominal distension (gaseous): Secondary | ICD-10-CM | POA: Diagnosis not present

## 2020-05-10 DIAGNOSIS — Z8379 Family history of other diseases of the digestive system: Secondary | ICD-10-CM

## 2020-05-10 LAB — POCT URINE PREGNANCY: Preg Test, Ur: NEGATIVE

## 2020-05-10 LAB — HM DIABETES EYE EXAM

## 2020-05-10 MED ORDER — LEVONORGEST-ETH ESTRAD 91-DAY 0.15-0.03 &0.01 MG PO TABS: 1.0000 | ORAL_TABLET | Freq: Every day | ORAL | 4 refills | Status: DC

## 2020-05-10 MED ORDER — ESCITALOPRAM OXALATE 10 MG PO TABS: 10.0000 mg | ORAL_TABLET | Freq: Every day | ORAL | 1 refills | Status: DC

## 2020-05-10 NOTE — Progress Notes (Signed)
Subjective:     Traci Rivas is a 29 y.o. female and is here for a comprehensive physical exam. The patient reports no problems.  Social History   Socioeconomic History  . Marital status: Single    Spouse name: Not on file  . Number of children: Not on file  . Years of education: Not on file  . Highest education level: Not on file  Occupational History  . 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 Smoking status: Never Smoker  . Smokeless tobacco: Never Used  Vaping Use  . Vaping Use: Never used  Substance and Sexual Activity  . Alcohol use: No    Alcohol/week: 0.0 standard drinks  . Drug use: No  . Sexual activity: Yes    Partners: Male    Birth control/protection: Condom  Other Topics Concern  . Not on file  Social History Narrative  . Not on file   Social Determinants of Health   Financial Resource Strain:   . Difficulty of Paying Living Expenses:   Food Insecurity:   . Worried About Charity fundraiser in the Last Year:   . Arboriculturist in the Last Year:   Transportation Needs:   . Film/video editor (Medical):   Marland Kitchen Lack of Transportation (Non-Medical):   Physical Activity:   . Days of Exercise per Week:   . Minutes of Exercise per Session:   Stress:   . Feeling of Stress :   Social Connections:   . Frequency of Communication with Friends and Family:   . Frequency of Social Gatherings with Friends and Family:   . Attends Religious Services:   . Active Member of Clubs or Organizations:   . Attends Archivist Meetings:   Marland Kitchen Marital Status:   Intimate Partner Violence:   . Fear of Current or Ex-Partner:   . Emotionally Abused:   Marland Kitchen Physically Abused:   . Sexually Abused:    Health Maintenance  Topic Date Due  . FOOT EXAM  10/26/2014  . HEMOGLOBIN A1C  03/23/2019  . URINE MICROALBUMIN  09/23/2019  . INFLUENZA VACCINE  05/27/2020  . PAP-Cervical Cytology Screening  08/31/2020  . PAP SMEAR-Modifier   08/31/2020  . OPHTHALMOLOGY EXAM  05/10/2021  . TETANUS/TDAP  11/12/2028  . PNEUMOCOCCAL POLYSACCHARIDE VACCINE AGE 36-64 HIGH RISK  Completed  . COVID-19 Vaccine  Completed  . Hepatitis C Screening  Completed  . HIV Screening  Completed    The following portions of the patient's history were reviewed and updated as appropriate:  She  has a past medical history of Diabetes mellitus, Dysmenorrhea, Hypoglycemia associated with diabetes ([place]), Hypothyroidism, acquired, autoimmune, and Thyroiditis, autoimmune. She does not have any pertinent problems on file. She  has no past surgical history on file. Her family history includes Cancer in her paternal grandmother; Diabetes in her father and sister; Heart attack in her paternal grandfather; Heart disease in her paternal grandfather; Hypothyroidism in her sister. She  reports that she has never smoked. She has never used smokeless tobacco. She reports that she does not drink alcohol and does not use drugs. She has a current medication list which includes the following prescription(s): bayer microlet lancets, biotin, bayer contour next monitor, escitalopram, glucagon, glucose blood, fiasp flextouch, metformin, naproxen sodium, syringe 3cc/25gx5/8", tresiba flextouch, triamcinolone cream, and vitamin d (ergocalciferol). Current Outpatient Medications on File Prior to Visit  Medication Sig Dispense Refill  . BAYER MICROLET LANCETS  lancets Use as instructed to check sugar 3 times daily. 200 each 5  . BIOTIN PO Take 1 tablet by mouth daily.    . Blood Glucose Monitoring Suppl (BAYER CONTOUR NEXT MONITOR) w/Device KIT Use to check sugar 3 times daily. 1 kit 0  . escitalopram (LEXAPRO) 10 MG tablet Take 1 tablet by mouth once daily 30 tablet 4  . glucagon 1 MG injection Inject 1 mg into the skin once as needed. Follow package directions for low blood sugar. 1 each 11  . glucose blood (BAYER CONTOUR NEXT TEST) test strip Use as instructed to check sugar 4  times daily. 300 each 5  . Insulin Aspart, w/Niacinamide, (FIASP FLEXTOUCH) 100 UNIT/ML SOPN Inject 14-18 Units into the skin 3 (three) times daily before meals. 30 mL 5  . metFORMIN (GLUCOPHAGE) 1000 MG tablet Take 1 tablet (1,000 mg total) by mouth daily with supper. 90 tablet 3  . naproxen sodium (ALEVE) 220 MG tablet Take 220 mg by mouth daily as needed (pain).    . Syringe/Needle, Disp, (SYRINGE 3CC/25GX5/8") 25G X 5/8" 3 ML MISC Use as directed with B12 injection 10 each 5  . TRESIBA FLEXTOUCH 200 UNIT/ML FlexTouch Pen INJECT 36 UNITS SUBCUTANEOUSLY ONCE DAILY 9 mL 1  . triamcinolone cream (KENALOG) 0.1 % Apply 1 application topically 2 (two) times daily. 30 g 0  . Vitamin D, Ergocalciferol, (DRISDOL) 1.25 MG (50000 UT) CAPS capsule Take 1 capsule (50,000 Units total) by mouth every 7 (seven) days. 12 capsule 0   No current facility-administered medications on file prior to visit.   She is allergic to amoxicillin and penicillins..  Review of Systems Review of Systems  Constitutional: Negative for activity change, appetite change and fatigue.  HENT: Negative for hearing loss, congestion, tinnitus and ear discharge.  dentist q7mEyes: Negative for visual disturbance (see optho q1y -- vision corrected to 20/20 with glasses).  Respiratory: Negative for cough, chest tightness and shortness of breath.   Cardiovascular: Negative for chest pain, palpitations and leg swelling.  Gastrointestinal: Negative for abdominal pain, diarrhea, constipation and abdominal distention.  Genitourinary: Negative for urgency, frequency, decreased urine volume and difficulty urinating.  Musculoskeletal: Negative for back pain, arthralgias and gait problem.  Skin: Negative for color change, pallor and rash.  Neurological: Negative for dizziness, light-headedness, numbness and headaches.  Hematological: Negative for adenopathy. Does not bruise/bleed easily.  Psychiatric/Behavioral: Negative for suicidal ideas,  confusion, sleep disturbance, self-injury, dysphoric mood, decreased concentration and agitation.       Objective:    BP 100/80 (BP Location: Left Arm, Patient Position: Sitting, Cuff Size: Normal)   Pulse 97   Temp 98.2 F (36.8 C) (Oral)   Resp 18   Ht '5\' 2"'  (1.575 m)   Wt 188 lb 9.6 oz (85.5 kg)   LMP 04/15/2020   SpO2 96%   BMI 34.50 kg/m  General appearance: alert, cooperative, appears stated age and no distress Head: Normocephalic, without obvious abnormality, atraumatic Eyes: conjunctivae/corneas clear. PERRL, EOM's intact. Fundi benign. Ears: normal TM's and external ear canals both ears Nose: Nares normal. Septum midline. Mucosa normal. No drainage or sinus tenderness. Throat: lips, mucosa, and tongue normal; teeth and gums normal Neck: no adenopathy, no carotid bruit, no JVD, supple, symmetrical, trachea midline and thyroid not enlarged, symmetric, no tenderness/mass/nodules Back: symmetric, no curvature. ROM normal. No CVA tenderness. Lungs: clear to auscultation bilaterally Breasts: normal appearance, no masses or tenderness Heart: regular rate and rhythm, S1, S2 normal, no murmur, click, rub  or gallop Abdomen: soft, non-tender; bowel sounds normal; no masses,  no organomegaly Pelvic: cervix normal in appearance, external genitalia normal, no adnexal masses or tenderness, no cervical motion tenderness, rectovaginal septum normal, uterus normal size, shape, and consistency, vagina normal without discharge and pap done Extremities: extremities normal, atraumatic, no cyanosis or edema Pulses: 2+ and symmetric Skin: Skin color, texture, turgor normal. No rashes or lesions Lymph nodes: Cervical, supraclavicular, and axillary nodes normal. Neurologic: Alert and oriented X 3, normal strength and tone. Normal symmetric reflexes. Normal coordination and gait    Assessment:    Healthy female exam.      Plan:     ghm utd Check labs  See After Visit Summary for  Counseling Recommendations    1. Preventative health care See above  - Cytology - PAP( [place]) - Lipid panel - TSH - CBC with Differential/Platelet - Comprehensive metabolic panel  2. Encounter for routine gynecological examination Refill bcp - Levonorgestrel-Ethinyl Estradiol (AMETHIA) 0.15-0.03 &0.01 MG tablet; Take 1 tablet by mouth daily.  Dispense: 91 tablet; Refill: 4  3. Depression, major, single episode, moderate ([facility]) Stable refill meds  - escitalopram (LEXAPRO) 10 MG tablet; Take 1 tablet (10 mg total) by mouth daily.  Dispense: 90 tablet; Refill: 1  4. Anxiety Stable refill lexapro  - escitalopram (LEXAPRO) 10 MG tablet; Take 1 tablet (10 mg total) by mouth daily.  Dispense: 90 tablet; Refill: 1  5. Abdominal bloating Family hx celiac---- - Gliadin antibodies, serum - Tissue transglutaminase, IgA - Reticulin Antibody, IgA w reflex titer  6. Family history of celiac disease Check labs  - Gliadin antibodies, serum - Tissue transglutaminase, IgA - Reticulin Antibody, IgA w reflex titer  7. Encounter for initial prescription of contraceptives, unspecified contraceptive Refill bcp  - POCT urine pregnancy  8. Hypothyroidism, acquired, autoimmune Per endo   9. Type 1 diabetes mellitus not at goal [facility]) Per endo

## 2020-05-10 NOTE — Patient Instructions (Signed)
Preventive Care 21-29 Years Old, Female Preventive care refers to visits with your health care provider and lifestyle choices that can promote health and wellness. This includes:  A yearly physical exam. This may also be called an annual well check.  Regular dental visits and eye exams.  Immunizations.  Screening for certain conditions.  Healthy lifestyle choices, such as eating a healthy diet, getting regular exercise, not using drugs or products that contain nicotine and tobacco, and limiting alcohol use. What can I expect for my preventive care visit? Physical exam Your health care provider will check your:  Height and weight. This may be used to calculate body mass index (BMI), which tells if you are at a healthy weight.  Heart rate and blood pressure.  Skin for abnormal spots. Counseling Your health care provider may ask you questions about your:  Alcohol, tobacco, and drug use.  Emotional well-being.  Home and relationship well-being.  Sexual activity.  Eating habits.  Work and work environment.  Method of birth control.  Menstrual cycle.  Pregnancy history. What immunizations do I need?  Influenza (flu) vaccine  This is recommended every year. Tetanus, diphtheria, and pertussis (Tdap) vaccine  You may need a Td booster every 10 years. Varicella (chickenpox) vaccine  You may need this if you have not been vaccinated. Human papillomavirus (HPV) vaccine  If recommended by your health care provider, you may need three doses over 6 months. Measles, mumps, and rubella (MMR) vaccine  You may need at least one dose of MMR. You may also need a second dose. Meningococcal conjugate (MenACWY) vaccine  One dose is recommended if you are age 19-21 years and a first-year college student living in a residence hall, or if you have one of several medical conditions. You may also need additional booster doses. Pneumococcal conjugate (PCV13) vaccine  You may need  this if you have certain conditions and were not previously vaccinated. Pneumococcal polysaccharide (PPSV23) vaccine  You may need one or two doses if you smoke cigarettes or if you have certain conditions. Hepatitis A vaccine  You may need this if you have certain conditions or if you travel or work in places where you may be exposed to hepatitis A. Hepatitis B vaccine  You may need this if you have certain conditions or if you travel or work in places where you may be exposed to hepatitis B. Haemophilus influenzae type b (Hib) vaccine  You may need this if you have certain conditions. You may receive vaccines as individual doses or as more than one vaccine together in one shot (combination vaccines). Talk with your health care provider about the risks and benefits of combination vaccines. What tests do I need?  Blood tests  Lipid and cholesterol levels. These may be checked every 5 years starting at age 20.  Hepatitis C test.  Hepatitis B test. Screening  Diabetes screening. This is done by checking your blood sugar (glucose) after you have not eaten for a while (fasting).  Sexually transmitted disease (STD) testing.  BRCA-related cancer screening. This may be done if you have a family history of breast, ovarian, tubal, or peritoneal cancers.  Pelvic exam and Pap test. This may be done every 3 years starting at age 21. Starting at age 30, this may be done every 5 years if you have a Pap test in combination with an HPV test. Talk with your health care provider about your test results, treatment options, and if necessary, the need for more tests.   Follow these instructions at home: Eating and drinking   Eat a diet that includes fresh fruits and vegetables, whole grains, lean protein, and low-fat dairy.  Take vitamin and mineral supplements as recommended by your health care provider.  Do not drink alcohol if: ? Your health care provider tells you not to drink. ? You are  pregnant, may be pregnant, or are planning to become pregnant.  If you drink alcohol: ? Limit how much you have to 0-1 drink a day. ? Be aware of how much alcohol is in your drink. In the [place] one drink equals one 12 oz bottle of beer (355 mL), one 5 oz glass of wine (148 mL), or one 1 oz glass of hard liquor (44 mL). Lifestyle  Take daily care of your teeth and gums.  Stay active. Exercise for at least 30 minutes on 5 or more days each week.  Do not use any products that contain nicotine or tobacco, such as cigarettes, e-cigarettes, and chewing tobacco. If you need help quitting, ask your health care provider.  If you are sexually active, practice safe sex. Use a condom or other form of birth control (contraception) in order to prevent pregnancy and STIs (sexually transmitted infections). If you plan to become pregnant, see your health care provider for a preconception visit. What's next?  Visit your health care provider once a year for a well check visit.  Ask your health care provider how often you should have your eyes and teeth checked.  Stay up to date on all vaccines. This information is not intended to replace advice given to you by your health care provider. Make sure you discuss any questions you have with your health care provider. Document Revised: 06/24/2018 Document Reviewed: 06/24/2018 Elsevier Patient Education  2020 [employer].

## 2020-05-11 LAB — CBC WITH DIFFERENTIAL/PLATELET
Basophils Absolute: 0.1 10*3/uL (ref 0.0–0.1)
Basophils Relative: 1 % (ref 0.0–3.0)
Eosinophils Absolute: 0.1 10*3/uL (ref 0.0–0.7)
Eosinophils Relative: 1.1 % (ref 0.0–5.0)
HCT: 44.4 % (ref 36.0–46.0)
Hemoglobin: 15.2 g/dL — ABNORMAL HIGH (ref 12.0–15.0)
Lymphocytes Relative: 27.9 % (ref 12.0–46.0)
Lymphs Abs: 2.6 10*3/uL (ref 0.7–4.0)
MCHC: 34.2 g/dL (ref 30.0–36.0)
MCV: 93.8 fl (ref 78.0–100.0)
Monocytes Absolute: 0.7 10*3/uL (ref 0.1–1.0)
Monocytes Relative: 7.8 % (ref 3.0–12.0)
Neutro Abs: 5.7 10*3/uL (ref 1.4–7.7)
Neutrophils Relative %: 62.2 % (ref 43.0–77.0)
Platelets: 280 10*3/uL (ref 150.0–400.0)
RBC: 4.73 Mil/uL (ref 3.87–5.11)
RDW: 12.5 % (ref 11.5–15.5)
WBC: 9.2 10*3/uL (ref 4.0–10.5)

## 2020-05-11 LAB — COMPREHENSIVE METABOLIC PANEL
ALT: 23 U/L (ref 0–35)
AST: 19 U/L (ref 0–37)
Albumin: 4.1 g/dL (ref 3.5–5.2)
Alkaline Phosphatase: 131 U/L — ABNORMAL HIGH (ref 39–117)
BUN: 11 mg/dL (ref 6–23)
CO2: 26 mEq/L (ref 19–32)
Calcium: 9.9 mg/dL (ref 8.4–10.5)
Chloride: 99 mEq/L (ref 96–112)
Creatinine, Ser: 0.58 mg/dL (ref 0.40–1.20)
GFR: 122.78 mL/min (ref >60.00)
Glucose, Bld: 91 mg/dL (ref 70–99)
Potassium: 3.7 mEq/L (ref 3.5–5.1)
Sodium: 136 mEq/L (ref 135–145)
Total Bilirubin: 0.7 mg/dL (ref 0.2–1.2)
Total Protein: 6.7 g/dL (ref 6.0–8.3)

## 2020-05-11 LAB — LIPID PANEL
Cholesterol: 251 mg/dL — ABNORMAL HIGH (ref 0–200)
HDL: 51 mg/dL (ref >39.00)
NonHDL: 200.33
Total CHOL/HDL Ratio: 5
Triglycerides: 341 mg/dL — ABNORMAL HIGH (ref 0.0–149.0)
VLDL: 68.2 mg/dL — ABNORMAL HIGH (ref 0.0–40.0)

## 2020-05-11 LAB — TSH: TSH: 7.07 u[IU]/mL — ABNORMAL HIGH (ref 0.35–4.50)

## 2020-05-11 LAB — LDL CHOLESTEROL, DIRECT: Direct LDL: 157 mg/dL

## 2020-05-13 NOTE — Assessment & Plan Note (Signed)
Per endo °

## 2020-05-14 ENCOUNTER — Other Ambulatory Visit: Payer: Self-pay

## 2020-05-14 DIAGNOSIS — E109 Type 1 diabetes mellitus without complications: Secondary | ICD-10-CM

## 2020-05-14 MED ORDER — GLUCOSE BLOOD VI STRP: ORAL_STRIP | 5 refills | Status: DC

## 2020-05-14 MED ORDER — LEVOTHYROXINE SODIUM 50 MCG PO TABS: 50.0000 ug | ORAL_TABLET | Freq: Every day | ORAL | 2 refills | Status: DC

## 2020-05-14 NOTE — Addendum Note (Signed)
Addended by: Steve Rattler A on: 05/14/2020 03:28 PM   Modules accepted: Orders

## 2020-05-15 LAB — TISSUE TRANSGLUTAMINASE, IGA: (tTG) Ab, IgA: 1 U/mL

## 2020-05-15 LAB — GLIADIN ANTIBODIES, SERUM
Gliadin IgA: 15 Units
Gliadin IgG: 3 Units

## 2020-05-15 LAB — RETICULIN ANTIBODIES, IGA W TITER: Reticulin IGA Screen: NEGATIVE

## 2020-05-18 LAB — CYTOLOGY - PAP
Chlamydia: NEGATIVE
Comment: NEGATIVE
Comment: NEGATIVE
Comment: NEGATIVE
Comment: NORMAL
Diagnosis: NEGATIVE
HSV1: NEGATIVE
HSV2: NEGATIVE
Neisseria Gonorrhea: NEGATIVE
Trichomonas: NEGATIVE

## 2020-06-01 ENCOUNTER — Telehealth: Payer: Self-pay | Admitting: Family Medicine

## 2020-06-01 NOTE — Telephone Encounter (Signed)
Please advise 

## 2020-06-01 NOTE — Telephone Encounter (Signed)
CallerMakyra Corprew Call Back # [phone]  Patient states she has been on levonorgestrel-ethinyl Estradiol for a few weeks now. Patient menstrual cycle came on earlier. Patient is wondering if synthroid has something to do with it.  Please advise

## 2020-06-01 NOTE — Telephone Encounter (Signed)
Yes the synthroid can make her periods irregular -- I attempted to call the pt --- vm full

## 2020-06-01 NOTE — Telephone Encounter (Signed)
It could be the synthroid

## 2020-06-05 ENCOUNTER — Telehealth: Payer: Self-pay | Admitting: Family Medicine

## 2020-06-05 NOTE — Telephone Encounter (Signed)
Caller : Cathrine  Call  Back # [phone]  Patient states that she would like to speak with someone in reference to her medication synthroid. Patient states she has been on her period for over a week. Patient would like to know if she should adjust dosage of medication  Please advise

## 2020-06-06 MED ORDER — LEVOTHYROXINE SODIUM 75 MCG PO TABS: 75.0000 ug | ORAL_TABLET | Freq: Every day | ORAL | 2 refills | Status: DC

## 2020-06-06 NOTE — Telephone Encounter (Signed)
I see you tried to call patient. Please advise

## 2020-06-06 NOTE — Telephone Encounter (Signed)
Spoke with patient. Pt verbalized understanding. New med sent in.

## 2020-06-06 NOTE — Telephone Encounter (Signed)
See other telephone note.  

## 2020-06-06 NOTE — Telephone Encounter (Signed)
Tsh was high---  hypothyroid---- increase synthroid to 75 mcg 1 po qd #30  2 months and recheck tsh in 2 months

## 2020-08-02 ENCOUNTER — Other Ambulatory Visit: Payer: Self-pay | Admitting: Internal Medicine

## 2020-08-02 DIAGNOSIS — E109 Type 1 diabetes mellitus without complications: Secondary | ICD-10-CM

## 2020-08-06 ENCOUNTER — Encounter: Payer: Self-pay | Admitting: Internal Medicine

## 2020-08-06 ENCOUNTER — Telehealth: Payer: Self-pay | Admitting: Family Medicine

## 2020-08-06 ENCOUNTER — Other Ambulatory Visit: Payer: Self-pay

## 2020-08-06 ENCOUNTER — Ambulatory Visit: Payer: BC Managed Care – PPO | Admitting: Internal Medicine

## 2020-08-06 VITALS — BP 122/64 | HR 91 | Ht 62.0 in | Wt 180.4 lb

## 2020-08-06 DIAGNOSIS — E109 Type 1 diabetes mellitus without complications: Secondary | ICD-10-CM

## 2020-08-06 DIAGNOSIS — E063 Autoimmune thyroiditis: Secondary | ICD-10-CM

## 2020-08-06 LAB — POCT GLYCOSYLATED HEMOGLOBIN (HGB A1C): Hemoglobin A1C: 10.1 % — AB (ref 4.0–5.6)

## 2020-08-06 MED ORDER — METFORMIN HCL 1000 MG PO TABS: 1000.0000 mg | ORAL_TABLET | Freq: Two times a day (BID) | ORAL | 3 refills | Status: DC

## 2020-08-06 NOTE — Telephone Encounter (Signed)
Caller name: Aarika Call back [phone]   Patient states she staying in [place], [place] where she is attending school. She needs a new referral to an endocrinologist. The doctor she was seeing notify her that they could no longer see her.

## 2020-08-06 NOTE — Progress Notes (Signed)
Patient ID: Traci Rivas, female   DOB: 07-30-1991, 29 y.o.   MRN: [id number]  This visit occurred during the SARS-CoV-2 public health emergency.  Safety protocols were in place, including screening questions prior to the visit, additional usage of staff PPE, and extensive cleaning of exam room while observing appropriate contact time as indicated for disinfecting solutions.   HPI: Traci Rivas is a 29 y.o.-year-old female, initially referred by her Elon Alas, NP Harle Battiest), presenting for follow-up for DM1, diagnosed in 2010 (age 57), uncontrolled, without complications and Hashimoto's hypothyroidism.   She now goes to school in [place].  Last visit almost a year ago, she does not usually come to the appointments more frequently.  Reviewed HbA1c levels: Lab Results  Component Value Date   HGBA1C 10.6 (A) 09/22/2018   HGBA1C 10.0 06/25/2017   HGBA1C 10.7 10/30/2016   Pt was on: - Lantus 36 units at bedtime. - NovoLog - inject 15 min before a meal: - ICR: 15 >> 10 (still using 15!) - 10-17 units per meal - skips b'fast usually - target: 150 >> 120 - ISF: 50  States she was not using the sliding scale, at last visit I suggested the following regimen: - Tresiba U200 36 units at bedtime. - FiAsp 14-18 units before a meal -missing doses or taking that without a relationship to the meal - Metformin 1000 mg with dinner >> tolerates it well  Pt checks her sugars twice a day per review of her meter download: - am: 83, 87-244, 310 >> 80-200s >> 100, 120-140, 200 >> 100-220 - 2h after b'fast: n/c >> 273-339 >> n/c - before lunch: 200s >> 481 >> 200s >> n/c - 2h after lunch: n/c >> 203, 272 >> n/c - before dinner: 89-199 >> 100s-200s >> 150-250 >> 200s - 2h after dinner:  107-303>> n/c - bedtime: 150-200 >> 83-389 >> n/c - nighttime: 50s-70s >> n/c >> 104-319 >> 100s-? >> 65 Lowest sugar was 50s x1 (injected too much insulin with meal) >> 50s, 65; she has hypoglycemia awareness in the  80s.  No previous hypoglycemia admissions.  Highest sugar was 500s >> 400s (off Tresiba) >> 400s.  No previous DKA admissions.  -No CKD, last BUN/creatinine:  Lab Results  Component Value Date   BUN 11 05/10/2020   CREATININE 0.58 05/10/2020  Previously on lisinopril now off.  -Normal ACR: Lab Results  Component Value Date   MICRALBCREAT 0.7 09/22/2018   MICRALBCREAT 0.7 06/25/2017   MICRALBCREAT 0.9 03/13/2016   MICRALBCREAT 2.5 05/02/2013   MICRALBCREAT 2.8 10/11/2012   -+ Dyslipidemia. Last set of lipids: Lab Results  Component Value Date   CHOL 251 (H) 05/10/2020   HDL 51.00 05/10/2020   LDLCALC 92 06/25/2017   LDLDIRECT 157.0 05/10/2020   TRIG 341.0 (H) 05/10/2020   CHOLHDL 5 05/10/2020   - last eye exam was in 04/2020: NO DR reportedly -No numbness and tingling in her feet.  Hashimoto's hypothyroidism: - prev. on levothyroxine generic (changed from brand name) 37.5 mcg daily >> ran out and did not restart it  Reviewed TFTs: Lab Results  Component Value Date   TSH 7.07 (H) 05/10/2020   TSH 4.65 (H) 11/12/2018   TSH 2.39 06/25/2017   TSH 3.01 03/13/2016   TSH 1.999 12/07/2014   TSH 6.526 (H) 10/24/2014   TSH 3.138 10/13/2013   TSH 3.942 05/02/2013   TSH 1.620 04/27/2012   TSH 2.108 04/29/2011   TSH 1.87 06/15/2009   TSH 3.64 05/31/2008  TSH 2.93 08/30/2007   PCP started LT4 75 mcg daily after the last results returned in 04/2020. She takes this: - in am - fasting - at least 30 min from b'fast - no Ca, Fe, MVI, PPIs - not on Biotin   ROS: Constitutional: no weight gain/no weight loss, no fatigue, no subjective hyperthermia, no subjective hypothermia Eyes: no blurry vision, no xerophthalmia ENT: no sore throat, no nodules palpated in neck, no dysphagia, no odynophagia, no hoarseness Cardiovascular: no CP/no SOB/no palpitations/no leg swelling Respiratory: no cough/no SOB/no wheezing Gastrointestinal: no N/no V/no D/no C/no acid  reflux Musculoskeletal: no muscle aches/no joint aches Skin: no rashes, no hair loss Neurological: no tremors/no numbness/no tingling/no dizziness  I reviewed pt's medications, allergies, PMH, social hx, family hx, and changes were documented in the history of present illness. Otherwise, unchanged from my initial visit note.  Past Medical History:  Diagnosis Date  . Diabetes mellitus    type 1  . Dysmenorrhea   . Hypoglycemia associated with diabetes ([place])   . Hypothyroidism, acquired, autoimmune   . Thyroiditis, autoimmune    No past surgical history on file. Social History   Social History  . Marital Status: Single    Spouse Name: N/A  . Number of Children: 0   Occupational History  . Not on file.   Social History Main Topics  . Smoking status: Never Smoker   . Smokeless tobacco: Never Used  . Alcohol Use: No  . Drug Use: No  . Sexual Activity: Not Currently    Birth Control/ Protection: Pill   Current Outpatient Medications on File Prior to Visit  Medication Sig Dispense Refill  . BAYER MICROLET LANCETS lancets Use as instructed to check sugar 3 times daily. 200 each 5  . BIOTIN PO Take 1 tablet by mouth daily.    . Blood Glucose Monitoring Suppl (BAYER CONTOUR NEXT MONITOR) w/Device KIT Use to check sugar 3 times daily. 1 kit 0  . escitalopram (LEXAPRO) 10 MG tablet Take 1 tablet (10 mg total) by mouth daily. 90 tablet 1  . glucagon 1 MG injection Inject 1 mg into the skin once as needed. Follow package directions for low blood sugar. 1 each 11  . glucose blood (BAYER CONTOUR NEXT TEST) test strip Use as instructed to check sugar 4 times daily. 300 each 5  . Insulin Aspart, w/Niacinamide, (FIASP FLEXTOUCH) 100 UNIT/ML SOPN Inject 14-18 Units into the skin 3 (three) times daily before meals. 30 mL 5  . Levonorgestrel-Ethinyl Estradiol (AMETHIA) 0.15-0.03 &0.01 MG tablet Take 1 tablet by mouth daily. 91 tablet 4  . levothyroxine (SYNTHROID) 75 MCG tablet Take 1 tablet  (75 mcg total) by mouth daily before breakfast. 30 tablet 2  . metFORMIN (GLUCOPHAGE) 1000 MG tablet Take 1 tablet (1,000 mg total) by mouth daily with supper. 90 tablet 3  . naproxen sodium (ALEVE) 220 MG tablet Take 220 mg by mouth daily as needed (pain).    . Syringe/Needle, Disp, (SYRINGE 3CC/25GX5/8") 25G X 5/8" 3 ML MISC Use as directed with B12 injection 10 each 5  . TRESIBA FLEXTOUCH 200 UNIT/ML FlexTouch Pen INJECT 36 UNITS SUBCUTANEOUSLY ONCE DAILY 9 mL 0  . triamcinolone cream (KENALOG) 0.1 % Apply 1 application topically 2 (two) times daily. 30 g 0  . Vitamin D, Ergocalciferol, (DRISDOL) 1.25 MG (50000 UT) CAPS capsule Take 1 capsule (50,000 Units total) by mouth every 7 (seven) days. 12 capsule 0   No current facility-administered medications on file prior to  visit.   Allergies  Allergen Reactions  . Amoxicillin Rash    Did it involve swelling of the face/tongue/throat, SOB, or low BP? n Did it involve sudden or severe rash/hives, skin peeling, or any reaction on the inside of your mouth or nose? n Did you need to seek medical attention at a hospital or doctor's office? n When did it last happen?had reaction as a child If all above answers are "NO", may proceed with cephalosporin use.  Marland Kitchen Penicillins Rash   Family History  Problem Relation Age of Onset  . Diabetes Father   . Heart attack Paternal Grandfather   . Heart disease Paternal Grandfather   . Diabetes Sister   . Hypothyroidism Sister   . Cancer Paternal Grandmother        Colon   Pt has FH of DM1 in sister.  PE: There were no vitals taken for this visit. Wt Readings from Last 3 Encounters:  05/10/20 188 lb 9.6 oz (85.5 kg)  10/31/19 188 lb (85.3 kg)  04/03/19 168 lb (76.2 kg)   Constitutional: overweight, in NAD Eyes: PERRLA, EOMI, no exophthalmos ENT: moist mucous membranes, no thyromegaly, no cervical lymphadenopathy Cardiovascular: RRR, No MRG Respiratory: CTA B Gastrointestinal: abdomen soft, NT,  ND, BS+ Musculoskeletal: no deformities, strength intact in all 4 Skin: moist, warm, no rashes Neurological: no tremor with outstretched hands, DTR normal in all 4  ASSESSMENT: 1. DM1, uncontrolled, without complications  2. Hashimoto's hypothyroidism  PLAN:  1. Patient with longstanding, uncontrolled, type 1 diabetes, on basal-bolus insulin regimen returning after another long absence of almost a year.  At last visit was a virtual visit in 08/2019.  Since then, she had COVID-19 in 10/2019. -At last visit, sugars were still variable but she had better compliance with her insulin.  At that time, we increased her Fiasp slightly but also added Metformin with dinner.  I explained at that time that Metformin is used off label for type 1 diabetes and advised her to stop the medication if she becomes dehydrated for any reason. -At today's visit, we discussed that, unfortunately, since she is not compliant with her appointments and usually comes at intervals of every 1 year or more, and also missing insulin doses and evening meal sugar checks, and also since she is now in school in [place], I suggested to establish care with another endocrinologist since I do not feel I can help her. -At today's visit, she tells me she is interested in an insulin pump but I explained that for this, she needs compliance with visits and evidence of increased involvement in her diabetes care.  She can explore this after she establishes care with another endocrinologist  -Sugars are quite variable, better and eating a mildly low in the middle of the night but very high especially towards the end of the day.  I explained that this is usually a sign of not enough mealtime insulin.  She is missing doses or taking them without a relationship with a meal.  I advised her that she needs to take Fiasp before every meal.  Also, she tolerates Metformin well so I advised her to increase the dose to twice a day.  She is interested in  increasing the dose of [place] and we can do this, but I would not suggest a large increase since she can drop her sugars between meals and at nighttime.  I advised her to be very careful with low blood sugars after the increase in dose. -  I advised her to: Patient Instructions  Please increase: - Tresiba U200 40 units at bedtime.  Continue: - FiAsp 14-18 units before every meal  Try to increase: - Metformin 1000 mg 2x a day  Please stop at the lab.  Please continue Levothyroxine 75 mcg daily.  Take the thyroid hormone every day, with water, at least 30 minutes before breakfast, separated by at least 4 hours from: - acid reflux medications - calcium - iron - multivitamins  Please establish care with another endocrinologist.  - we checked her HbA1c: 10.1% (still very high) - advised to check sugars at different times of the day - 3x a day, rotating check times - advised for yearly eye exams >> she is UTD - return to clinic in 3-4 months    2. Hashimoto's hypothyroidism -She has a history of noncompliance with levothyroxine in the past -However, she did not require levothyroxine more recently -Last TSH was reviewed and this was elevated at 7.07 on 05/10/2020 -At that time, PCP recommended to restart levothyroxine, but at a higher dose, of 75 mcg daily. -We will repeat her TFTs at today's visit  Component     Latest Ref Rng & Units 08/06/2020  TSH     0.35 - 4.50 uIU/mL 2.50  T4,Free(Direct)     0.60 - 1.60 ng/dL 0.91   Normal TFTs.  Philemon Kingdom, MD PhD [facility] Endocrinology

## 2020-08-06 NOTE — Patient Instructions (Addendum)
Please increase: - Tresiba U200 40 units at bedtime.  Continue: - FiAsp 14-18 units before every meal  Try to increase: - Metformin 1000 mg 2x a day  Please stop at the lab.  Please continue Levothyroxine 75 mcg daily.  Take the thyroid hormone every day, with water, at least 30 minutes before breakfast, separated by at least 4 hours from: - acid reflux medications - calcium - iron - multivitamins  Please establish care with another endocrinologist.

## 2020-08-06 NOTE — Telephone Encounter (Signed)
Referral placed.

## 2020-08-07 ENCOUNTER — Telehealth: Payer: Self-pay

## 2020-08-07 LAB — T4, FREE: Free T4: 0.91 ng/dL (ref 0.60–1.60)

## 2020-08-07 LAB — TSH: TSH: 2.5 u[IU]/mL (ref 0.35–4.50)

## 2020-08-07 NOTE — Telephone Encounter (Signed)
-----   Message from Carlus Pavlov, MD sent at 08/07/2020 12:25 PM EDT ----- Can you please call pt: Thyroid tests are all normal.

## 2020-08-07 NOTE — Telephone Encounter (Signed)
Patient aware of results.

## 2020-09-19 ENCOUNTER — Ambulatory Visit: Payer: BC Managed Care – PPO | Admitting: Medical

## 2020-10-02 ENCOUNTER — Other Ambulatory Visit: Payer: Self-pay | Admitting: Family Medicine

## 2020-10-02 ENCOUNTER — Other Ambulatory Visit: Payer: Self-pay | Admitting: Internal Medicine

## 2020-10-02 DIAGNOSIS — E109 Type 1 diabetes mellitus without complications: Secondary | ICD-10-CM

## 2020-10-16 ENCOUNTER — Other Ambulatory Visit: Payer: Self-pay | Admitting: Internal Medicine

## 2020-10-16 DIAGNOSIS — E109 Type 1 diabetes mellitus without complications: Secondary | ICD-10-CM

## 2020-10-30 ENCOUNTER — Telehealth: Payer: Self-pay | Admitting: Family Medicine

## 2020-10-30 NOTE — Telephone Encounter (Signed)
Patient states she would her immunization records emailed to:[email or web address]

## 2020-10-30 NOTE — Telephone Encounter (Signed)
Patient is calling in reference to needing a referral to endocrinologist.

## 2020-10-30 NOTE — Telephone Encounter (Signed)
Pt called. LVM to call back. Referral placed in October.

## 2020-10-30 NOTE — Telephone Encounter (Signed)
Records emailed ?

## 2020-11-06 ENCOUNTER — Other Ambulatory Visit: Payer: Self-pay | Admitting: Internal Medicine

## 2020-11-24 ENCOUNTER — Other Ambulatory Visit: Payer: Self-pay | Admitting: Internal Medicine

## 2020-11-24 DIAGNOSIS — E109 Type 1 diabetes mellitus without complications: Secondary | ICD-10-CM

## 2020-12-25 ENCOUNTER — Other Ambulatory Visit: Payer: Self-pay

## 2020-12-25 DIAGNOSIS — E063 Autoimmune thyroiditis: Secondary | ICD-10-CM

## 2020-12-25 NOTE — Telephone Encounter (Signed)
Patient wants referral to Dr. Majel Homer in [facility]

## 2020-12-25 NOTE — Telephone Encounter (Signed)
Referral placed.

## 2021-01-14 ENCOUNTER — Other Ambulatory Visit: Payer: Self-pay | Admitting: Internal Medicine

## 2021-01-14 DIAGNOSIS — E109 Type 1 diabetes mellitus without complications: Secondary | ICD-10-CM

## 2021-02-06 ENCOUNTER — Other Ambulatory Visit: Payer: Self-pay | Admitting: Internal Medicine

## 2021-02-06 DIAGNOSIS — E109 Type 1 diabetes mellitus without complications: Secondary | ICD-10-CM

## 2021-03-05 ENCOUNTER — Ambulatory Visit: Payer: BC Managed Care – PPO | Admitting: Internal Medicine

## 2021-03-05 ENCOUNTER — Other Ambulatory Visit: Payer: Self-pay

## 2021-03-05 ENCOUNTER — Encounter: Payer: Self-pay | Admitting: Internal Medicine

## 2021-03-05 VITALS — BP 122/78 | HR 78 | Ht 62.0 in | Wt 182.2 lb

## 2021-03-05 DIAGNOSIS — E109 Type 1 diabetes mellitus without complications: Secondary | ICD-10-CM

## 2021-03-05 DIAGNOSIS — E063 Autoimmune thyroiditis: Secondary | ICD-10-CM | POA: Diagnosis not present

## 2021-03-05 LAB — POCT GLYCOSYLATED HEMOGLOBIN (HGB A1C): Hemoglobin A1C: 10.6 % — AB (ref 4.0–5.6)

## 2021-03-05 MED ORDER — LEVOTHYROXINE SODIUM 75 MCG PO TABS: 75.0000 ug | ORAL_TABLET | Freq: Every day | ORAL | 1 refills | Status: DC

## 2021-03-05 MED ORDER — TRULICITY 0.75 MG/0.5ML ~~LOC~~ SOAJ: 0.7500 mg | SUBCUTANEOUS | 4 refills | Status: DC

## 2021-03-05 MED ORDER — DEXCOM G6 TRANSMITTER MISC: 1.0000 | 3 refills | Status: DC

## 2021-03-05 MED ORDER — TRESIBA FLEXTOUCH 200 UNIT/ML ~~LOC~~ SOPN: 36.0000 [IU] | PEN_INJECTOR | Freq: Every day | SUBCUTANEOUS | 2 refills | Status: DC

## 2021-03-05 MED ORDER — FIASP FLEXTOUCH 100 UNIT/ML ~~LOC~~ SOPN: PEN_INJECTOR | SUBCUTANEOUS | 2 refills | Status: DC

## 2021-03-05 MED ORDER — INSULIN PEN NEEDLE 30G X 5 MM MISC: 1.0000 | Freq: Four times a day (QID) | 3 refills | Status: DC

## 2021-03-05 MED ORDER — DEXCOM G6 SENSOR MISC: 1.0000 | 3 refills | Status: DC

## 2021-03-05 NOTE — Progress Notes (Signed)
Name: Traci Rivas  MRN/ DOB: [id number], Jan 07, 1991   Age/ Sex: 30 y.o., female    PCP: Carollee Herter, Alferd Apa, DO   Reason for Endocrinology Evaluation: Type 1 Diabetes Mellitus/ Hypothyroidism     Date of Initial Endocrinology Visit: 03/05/2021     PATIENT IDENTIFIER: Traci Rivas is a 30 y.o. female with a past medical history of T1DM and Hypothyroidism. The patient presented for initial endocrinology clinic visit on 03/05/2021 for consultative assistance with her diabetes management.     DIABETIC HISTORY:  Traci Rivas was diagnosed with T1DM in 2010 at age 53, has been on insulin since her diagnosis. Her hemoglobin A1c has ranged from 9.0% in 2016, peaking at 10.6% in 2019.   Metformin showed  no difference in glycemic control by taking it once a day     Transitioned care from Dr. Cruzita Lederer by 03/05/2021  Sister with T1 DM   HASHIMOTO'S THYROIDITIS:  Has been on LT-4 replacement for years. Has long history of non-compliance.   Mother with Thyroid disease   SUBJECTIVE:   During the last visit (08/30/2019): Saw Dr. Cruzita Lederer . MDI regimen adjusted   Today (03/05/21): Traci Rivas is here for a follow up on diabetes and hypothyroidism.   She checks her sugars blood sugars 1 times daily, preprandial.The patient has not had hypoglycemic episodes since the last clinic visit.  The pt eats 2-3 meals a day, snacks occasionally . Avoids sugar sweetened beverages  No recent plans to conceive   She is asking about GLP1 agonists   Lives in [place], is going to school for music therapy     HOME ENDOCRINE  REGIMEN: Tresiba 36 units daily  Fiasp 14-18 units with meals  Levothyroxine 75 mcg daily  Metformin 1000 mg - not taking     Statin: no ACE-I/ARB: no Prior Diabetic Education: yes   METER DOWNLOAD SUMMARY: Date range evaluated: 4/26-5/07/2021  Average Number Tests/Day = 0.8 Overall Mean FS Glucose = 197 Standard Deviation = 84  BG Ranges: Low = 95 High =  348   Hypoglycemic Events/30 Days: BG < 50 = 0 Episodes of symptomatic severe hypoglycemia = 0   DIABETIC COMPLICATIONS: Microvascular complications:   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 Diabetes mellitus    type 1  . Dysmenorrhea   . Hypoglycemia associated with diabetes ([place])   . Hypothyroidism, acquired, autoimmune   . Thyroiditis, autoimmune     Past Surgical History: No past surgical history on file.   Social History:  reports that she has never smoked. She has never used smokeless tobacco. She reports current alcohol use. She reports that she does not use drugs. Family History:  Family History  Problem Relation Age of Onset  . Diabetes Father   . Heart attack Paternal Grandfather   . Heart disease Paternal Grandfather   . Diabetes Sister   . Hypothyroidism Sister   . Cancer Paternal Grandmother        Colon     HOME MEDICATIONS: Allergies as of 03/05/2021      Reactions   Amoxicillin Rash   Did it involve swelling of the face/tongue/throat, SOB, or low BP? n Did it involve sudden or severe rash/hives, skin peeling, or any reaction on the inside of your mouth or  nose? n Did you need to seek medical attention at a hospital or doctor's office? n When did it last happen?had reaction as a child If all above answers are "NO", may proceed with cephalosporin use.   Penicillins Rash      Medication List       Accurate as of Mar 05, 2021 10:36 AM. If you have any questions, ask your nurse or doctor.        Bayer Contour Next Monitor w/Device Kit Use to check sugar 3 times daily.   Bayer Microlet Lancets lancets Use as instructed to check sugar 3 times daily.   BIOTIN PO Take 1 tablet by mouth daily.   escitalopram 10 MG tablet Commonly known as:  LEXAPRO Take 1 tablet (10 mg total) by mouth daily.   Fiasp FlexTouch 100 UNIT/ML FlexTouch Pen Generic drug: insulin aspart INJECT 14 TO 18 UNITS 3 TIMES A DAILY BEFORE MEALS   glucagon 1 MG injection Inject 1 mg into the skin once as needed. Follow package directions for low blood sugar.   glucose blood test strip Commonly known as: Visual merchandiser Next Test Use as instructed to check sugar 4 times daily.   Levonorgestrel-Ethinyl Estradiol 0.15-0.03 &0.01 MG tablet Commonly known as: Amethia Take 1 tablet by mouth daily.   levothyroxine 75 MCG tablet Commonly known as: SYNTHROID Take 1 tablet (75 mcg total) by mouth daily before breakfast.   metFORMIN 1000 MG tablet Commonly known as: GLUCOPHAGE TAKE 1 TABLET BY MOUTH ONCE DAILY WITH  SUPPER   naproxen sodium 220 MG tablet Commonly known as: ALEVE Take 220 mg by mouth daily as needed (pain).   SYRINGE 3CC/25GX5/8" 25G X 5/8" 3 ML Misc Use as directed with B12 injection   Tresiba FlexTouch 200 UNIT/ML FlexTouch Pen Generic drug: insulin degludec INJECT 36 UNITS SUBCUTANEOUSLY ONCE DAILY   triamcinolone cream 0.1 % Commonly known as: KENALOG Apply 1 application topically 2 (two) times daily.   Vitamin D (Ergocalciferol) 1.25 MG (50000 UNIT) Caps capsule Commonly known as: DRISDOL Take 1 capsule (50,000 Units total) by mouth every 7 (seven) days.        ALLERGIES: Allergies  Allergen Reactions  . Amoxicillin Rash    Did it involve swelling of the face/tongue/throat, SOB, or low BP? n Did it involve sudden or severe rash/hives, skin peeling, or any reaction on the inside of your mouth or nose? n Did you need to seek medical attention at a hospital or doctor's office? n When did it last happen?had reaction as a child If all above answers are "NO", may proceed with cephalosporin use.  Marland Kitchen Penicillins Rash     REVIEW OF SYSTEMS: A comprehensive ROS was conducted with the patient and is negative except as per HPI     OBJECTIVE:   VITAL SIGNS: BP 122/78   Pulse 78   Ht _0  (1.575 m)   Wt 182 lb 4 oz (82.7 kg)   LMP 02/03/2021   SpO2 98%   BMI 33.33 kg/m    PHYSICAL EXAM:  General: Pt appears well and is in NAD  Neck: General: Supple without adenopathy or carotid bruits. Thyroid: Thyroid size normal.  No goiter or nodules appreciated.  Lungs: Clear with good BS bilat with no rales, rhonchi, or wheezes  Heart: RRR with normal S1 and S2 and no gallops; no murmurs; no rub  Abdomen: Normoactive bowel sounds, soft, nontender, without masses or organomegaly palpable  Extremities:  Lower extremities - No pretibial edema. No  lesions.  Skin: Normal texture and temperature to palpation. No rash noted.   Neuro: MS is good with appropriate affect, pt is alert and Ox3    DM foot exam: 03/05/2021 The skin of the feet is intact without sores or ulcerations. The pedal pulses are 2+ on right and 2+ on left. The sensation is intact to a screening 5.07, 10 gram monofilament bilaterally   DATA REVIEWED:  Lab Results  Component Value Date   HGBA1C 10.6 (A) 03/05/2021   HGBA1C 10.1 (A) 08/06/2020   HGBA1C 10.6 (A) 09/22/2018   Lab Results  Component Value Date   MICROALBUR 1.0 09/22/2018   LDLCALC 92 06/25/2017   CREATININE 0.58 05/10/2020   Lab Results  Component Value Date   MICRALBCREAT 0.7 09/22/2018    Lab Results  Component Value Date   CHOL 251 (H) 05/10/2020   HDL 51.00 05/10/2020   LDLCALC 92 06/25/2017   LDLDIRECT 157.0 05/10/2020   TRIG 341.0 (H) 05/10/2020   CHOLHDL 5 05/10/2020        ASSESSMENT / PLAN / RECOMMENDATIONS:   1) Type 1 Diabetes Mellitus, Poorly controlled, With retinopathic complications - Most recent A1c of 10.6 %. Goal A1c < 7.0 %.    Plan: GENERAL: I have discussed with the patient the pathophysiology of diabetes. We went over the natural progression of the disease. We talked about both insulin resistance and insulin deficiency. We stressed the  importance of lifestyle changes including diet and exercise. I explained the complications associated with diabetes including retinopathy, nephropathy, neuropathy as well as increased risk of cardiovascular disease. We went over the benefit seen with glycemic control.    I explained to the patient that diabetic patients are at higher than normal risk for amputations.  Discussed pharmacokinetics of basal/bolus insulin and the importance of taking prandial insulin with meals.   We also discussed avoiding sugar-sweetened beverages and snacks, when possible.   Historically had compliance issues but she has been motivated recently   She is interested in GLP-1 agonists, cautioned against GI side effects, she denies any personal hx of pancreatitis nor FH of medullary cancer   Will prescribe Dexcom, historically she was not comfortable with things attached to her body, but she is willing to try.   Will consider an insulin pump next, she was also given info to look in to the inpen, tandem and omnipods.   Metformin did not provide glycemic/weight improvement.   MEDICATIONS: - Start Trulicity 4.58 mg weekly  - Continue Tresiba 36 units daily  - Fiasp 12 units with each meal  - CF ( BG-130/30)   EDUCATION / INSTRUCTIONS:  BG monitoring instructions: Patient is instructed to check her blood sugars 3 times a day, before meals   Call [facility] if: BG persistently < 70  . I reviewed the Rule of 15 for the treatment of hypoglycemia in detail with the patient. Literature supplied.   2) Diabetic complications:   Eye: Does  have known diabetic retinopathy.   Neuro/ Feet: Does not have known diabetic peripheral neuropathy.  Renal: Patient does not have known baseline CKD. She is not on an ACEI/ARB at present.  3) Hypothyroidism:   - She is clinically euthyroid  - No local neck symptoms  - Pt educated extensively on the correct way to take levothyroxine (first thing in the  morning with water, 30 minutes before eating or taking other medications). - Pt encouraged to double dose the following day if she were to miss  a dose given long half-life of levothyroxine. - Unable to check TSH today as she is on Biotin, she was advised to hold it 2-3 days prior to future visits here.     Continue Levothyroxine 75 mcg daily    F/U in 3 months   Signed electronically by: Mack Guise, MD  [facility] Endocrinology  [address] [place],  [address] Phone: [phone] FAX: [phone]   CC: Ann Held, DO [address] [place] [address] Phone: [phone]  Fax: [phone]    Return to Endocrinology clinic as below: No future appointments.

## 2021-03-05 NOTE — Patient Instructions (Addendum)
-   Start Trulicity 0.75 mg weekly  - Continue Tresiba 36 units daily  - Fiasp 12 units with each meal  -Fiasp correctional insulin: ADD extra units on insulin to your meal-time Fiasp dose if your blood sugars are higher than 160. Use the scale below to help guide you:   Blood sugar before meal Number of units to inject  Less than 160 0 unit  161 -  190 1 units  191 -  220 2 units  221 -  250 3 units  251 -  280 4 units  281 -  310 5 units  311 -  340 6 units  341 -  370 7 units  371 -  400 8 units  401 - 430 9 units     Check out the InPEN  Check out Pumps: Omnipod and Tandem/Tslim     HOLD BIOTIN 2-3 days before your next appointment     Choose healthy, lower carb lower calorie snacks: toss salad, vegetables, cottage cheese, peanut butter, low fat cheese / string cheese, lower sodium deli meat, tuna salad or chicken salad     HOW TO TREAT LOW BLOOD SUGARS (Blood sugar LESS THAN 70 MG/DL)  Please follow the RULE OF 15 for the treatment of hypoglycemia treatment (when your (blood sugars are less than 70 mg/dL)    STEP 1: Take 15 grams of carbohydrates when your blood sugar is low, which includes:   3-4 GLUCOSE TABS  OR  3-4 OZ OF JUICE OR REGULAR SODA OR  ONE TUBE OF GLUCOSE GEL     STEP 2: RECHECK blood sugar in 15 MINUTES STEP 3: If your blood sugar is still low at the 15 minute recheck --> then, go back to STEP 1 and treat AGAIN with another 15 grams of carbohydrates.

## 2021-04-02 ENCOUNTER — Encounter: Payer: Self-pay | Admitting: Internal Medicine

## 2021-05-28 ENCOUNTER — Other Ambulatory Visit: Payer: Self-pay

## 2021-05-28 ENCOUNTER — Encounter: Payer: Self-pay | Admitting: Internal Medicine

## 2021-05-28 ENCOUNTER — Ambulatory Visit (INDEPENDENT_AMBULATORY_CARE_PROVIDER_SITE_OTHER): Payer: BC Managed Care – PPO | Admitting: Internal Medicine

## 2021-05-28 VITALS — BP 108/62 | HR 77 | Ht 67.0 in | Wt 187.0 lb

## 2021-05-28 DIAGNOSIS — E785 Hyperlipidemia, unspecified: Secondary | ICD-10-CM

## 2021-05-28 DIAGNOSIS — E063 Autoimmune thyroiditis: Secondary | ICD-10-CM | POA: Diagnosis not present

## 2021-05-28 DIAGNOSIS — E109 Type 1 diabetes mellitus without complications: Secondary | ICD-10-CM | POA: Diagnosis not present

## 2021-05-28 LAB — LIPID PANEL
Cholesterol: 222 mg/dL — ABNORMAL HIGH (ref 0–200)
HDL: 52.8 mg/dL (ref >39.00)
LDL Cholesterol: 142 mg/dL — ABNORMAL HIGH (ref 0–99)
NonHDL: 169.12
Total CHOL/HDL Ratio: 4
Triglycerides: 135 mg/dL (ref 0.0–149.0)
VLDL: 27 mg/dL (ref 0.0–40.0)

## 2021-05-28 LAB — COMPREHENSIVE METABOLIC PANEL
ALT: 21 U/L (ref 0–35)
AST: 17 U/L (ref 0–37)
Albumin: 3.9 g/dL (ref 3.5–5.2)
Alkaline Phosphatase: 59 U/L (ref 39–117)
BUN: 9 mg/dL (ref 6–23)
CO2: 25 mEq/L (ref 19–32)
Calcium: 9.1 mg/dL (ref 8.4–10.5)
Chloride: 101 mEq/L (ref 96–112)
Creatinine, Ser: 0.7 mg/dL (ref 0.40–1.20)
GFR: 116.24 mL/min (ref >60.00)
Glucose, Bld: 167 mg/dL — ABNORMAL HIGH (ref 70–99)
Potassium: 4.5 mEq/L (ref 3.5–5.1)
Sodium: 136 mEq/L (ref 135–145)
Total Bilirubin: 0.6 mg/dL (ref 0.2–1.2)
Total Protein: 6.8 g/dL (ref 6.0–8.3)

## 2021-05-28 LAB — MICROALBUMIN / CREATININE URINE RATIO
Creatinine,U: 67.5 mg/dL
Microalb Creat Ratio: 1 mg/g (ref 0.0–30.0)
Microalb, Ur: <0.7 mg/dL (ref 0.0–1.9)

## 2021-05-28 LAB — TSH: TSH: 3.96 u[IU]/mL (ref 0.35–5.50)

## 2021-05-28 LAB — POCT GLYCOSYLATED HEMOGLOBIN (HGB A1C): Hemoglobin A1C: 7.8 % — AB (ref 4.0–5.6)

## 2021-05-28 MED ORDER — FIASP FLEXTOUCH 100 UNIT/ML ~~LOC~~ SOPN: PEN_INJECTOR | SUBCUTANEOUS | 3 refills | Status: DC

## 2021-05-28 MED ORDER — TRESIBA FLEXTOUCH 200 UNIT/ML ~~LOC~~ SOPN: 40.0000 [IU] | PEN_INJECTOR | Freq: Every day | SUBCUTANEOUS | 3 refills | Status: DC

## 2021-05-28 MED ORDER — TRULICITY 1.5 MG/0.5ML ~~LOC~~ SOAJ: 1.5000 mg | SUBCUTANEOUS | 3 refills | Status: DC

## 2021-05-28 NOTE — Progress Notes (Signed)
Name: Traci Rivas  MRN/ DOB: [id number], 1990/11/15   Age/ Sex: 30 y.o., female    PCP: Ann Held, DO   Reason for Endocrinology Evaluation: Type 1 Diabetes Mellitus/ Hypothyroidism     Date of Initial Endocrinology Visit: 05/28/2021     PATIENT IDENTIFIER: Traci Rivas is a 30 y.o. female with a past medical history of T1DM and Hypothyroidism. The patient presented for initial endocrinology clinic visit on 05/28/2021 for consultative assistance with her diabetes management.     DIABETIC HISTORY:  Ms. Vinluan was diagnosed with T1DM in 2010 at age 50, has been on insulin since her diagnosis. Her hemoglobin A1c has ranged from 9.0% in 2016, peaking at 10.6% in 2019.   Metformin showed  no difference in glycemic control by taking it once a day     Transitioned care from Dr. Cruzita Lederer by 03/05/2021  Sister with T1 DM  Trulicity started 05/3150  HASHIMOTO'S THYROIDITIS:  Has been on LT-4 replacement for years. Has long history of non-compliance.   Mother with Thyroid disease   SUBJECTIVE:   During the last visit (03/05/2021): A1c 76.1 % Started Trulicity, continued MDI regimen     Today (05/28/21): Ms. Silos is here for a follow up on diabetes and hypothyroidism.   She checks her sugars blood sugars multiple  times daily, through CGM.The patient has not had hypoglycemic episodes since the last clinic visit.  Lives in [place], is going to school for music therapy   Denies recent nausea or diarrhea, initially had burping .       HOME ENDOCRINE  REGIMEN: Tresiba 40 units daily  Fiasp 14 units with meals  Trulicity 6.07 mg weekly  CF: NOvolog (BG-130/30)  Levothyroxine 75 mcg daily      Statin: no ACE-I/ARB: no Prior Diabetic Education: yes   CONTINUOUS GLUCOSE MONITORING RECORD INTERPRETATION    Dates of Recording: 7/20-05/28/2021  Sensor description: dexcom  Results statistics:   CGM use % of time 93  Average and SD 210/64  Time in range       34  %  % Time Above 180 40  % Time above 250 25  % Time Below target <1      Glycemic patterns summary: hyperglycemia noted during the day and at night , mainly post prandial   Hyperglycemic episodes  post prandial   Hypoglycemic episodes occurred rare post bolus   Overnight periods: variable      DIABETIC COMPLICATIONS: Microvascular complications:  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  Social History:  reports that she has never smoked. She has never used smokeless tobacco. She reports current alcohol use. She reports that she does not use drugs. Family History:  Family History  Problem Relation Age of Onset   Diabetes Father    Heart attack Paternal Grandfather    Heart disease Paternal Grandfather    Diabetes Sister    Hypothyroidism Sister    Cancer Paternal Grandmother        Colon     HOME MEDICATIONS: Allergies as of  05/28/2021       Reactions   Amoxicillin Rash   Did it involve swelling of the face/tongue/throat, SOB, or low BP? n Did it involve sudden or severe rash/hives, skin peeling, or any reaction on the inside of your mouth or nose? n Did you need to seek medical attention at a hospital or doctor's office? n When did it last happen? had reaction as a child If all above answers are "NO", may proceed with cephalosporin use.   Penicillins Rash        Medication List        Accurate as of May 28, 2021 10:50 AM. If you have any questions, ask your nurse or doctor.          STOP taking these medications    SYRINGE  3CC/25GX5/8" 25G X 5/8" 3 ML Misc Stopped by: Dorita Sciara, MD       TAKE these medications    Bayer Contour Next Monitor w/Device Kit Use to check sugar 3 times daily.   Bayer Microlet Lancets lancets Use as instructed to check sugar 3 times daily.   BIOTIN PO Take 1 tablet by mouth daily.   Dexcom G6 Sensor Misc 1 Device by Does not apply route as directed.   Dexcom G6 Transmitter Misc 1 Device by Does not apply route as directed.   escitalopram 10 MG tablet Commonly known as: LEXAPRO Take 1 tablet (10 mg total) by mouth daily.   Fiasp FlexTouch 100 UNIT/ML FlexTouch Pen Generic drug: insulin aspart Max daily 70 units   glucagon 1 MG injection Inject 1 mg into the skin once as needed. Follow package directions for low blood sugar.   glucose blood test strip Commonly known as: Visual merchandiser Next Test Use as instructed to check sugar 4 times daily.   Insulin Pen Needle 30G X 5 MM Misc 1 Device by Does not apply route in the morning, at noon, in the evening, and at bedtime.   Levonorgestrel-Ethinyl Estradiol 0.15-0.03 &0.01 MG tablet Commonly known as: Amethia Take 1 tablet by mouth daily.   levothyroxine 75 MCG tablet Commonly known as: SYNTHROID Take 1 tablet (75 mcg total) by mouth daily before breakfast.   naproxen sodium 220 MG tablet Commonly known as: ALEVE Take 220 mg by mouth daily as needed (pain).   Tyler Aas FlexTouch 200 UNIT/ML FlexTouch Pen Generic drug: insulin degludec Inject 36 Units into the skin daily.   triamcinolone cream 0.1 % Commonly known as: KENALOG Apply 1 application topically 2 (two) times daily.   Trulicity 9.32 UN/9.9VA Sopn Generic drug: Dulaglutide Inject 0.75 mg into the skin once a week.   Vitamin D (Ergocalciferol) 1.25 MG (50000 UNIT) Caps capsule Commonly known as: DRISDOL Take 1 capsule (50,000 Units total) by mouth every 7 (seven) days.         ALLERGIES: Allergies  Allergen Reactions    Amoxicillin Rash    Did it involve swelling of the face/tongue/throat, SOB, or low BP? n Did it involve sudden or severe rash/hives, skin peeling, or any reaction on the inside of your mouth or nose? n Did you need to seek medical attention at a hospital or doctor's office? n When did it last happen? had reaction as a child If all above answers are "NO", may proceed with cephalosporin use.   Penicillins Rash     REVIEW OF SYSTEMS: A comprehensive ROS was conducted with the patient and is negative except as per HPI    OBJECTIVE:  VITAL SIGNS: BP 108/62 (BP Location: Left Arm, Patient Position: Sitting, Cuff Size: Normal)   Pulse 77   Ht '5\' 7"'  (1.702 m)   Wt 187 lb (84.8 kg)   SpO2 96%   BMI 29.29 kg/m    PHYSICAL EXAM:  General: Pt appears well and is in NAD  Neck:  Thyroid: Thyroid size normal.   Lungs: Clear with good BS bilat with no rales, rhonchi, or wheezes  Heart: RRR with normal S1 and S2 and no gallops; no murmurs; no rub  Abdomen: Normoactive bowel sounds, soft, nontender, without masses or organomegaly palpable  Extremities:  Lower extremities - No pretibial edema. No lesions.  Neuro: MS is good with appropriate affect, pt is alert and Ox3    DM foot exam: 03/05/2021 The skin of the feet is intact without sores or ulcerations. The pedal pulses are 2+ on right and 2+ on left. The sensation is intact to a screening 5.07, 10 gram monofilament bilaterally   DATA REVIEWED:  Lab Results  Component Value Date   HGBA1C 10.6 (A) 03/05/2021   HGBA1C 10.1 (A) 08/06/2020   HGBA1C 10.6 (A) 09/22/2018    Results for SHER, SHAMPINE (MRN [id number]) as of 05/29/2021 12:48  Ref. Range 05/28/2021 11:20  Sodium Latest Ref Range: 135 - 145 mEq/L 136  Potassium Latest Ref Range: 3.5 - 5.1 mEq/L 4.5  Chloride Latest Ref Range: 96 - 112 mEq/L 101  CO2 Latest Ref Range: 19 - 32 mEq/L 25  Glucose Latest Ref Range: 70 - 99 mg/dL 167 (H)  BUN Latest Ref Range: 6 - 23 mg/dL 9   Creatinine Latest Ref Range: 0.40 - 1.20 mg/dL 0.70  Calcium Latest Ref Range: 8.4 - 10.5 mg/dL 9.1  Alkaline Phosphatase Latest Ref Range: 39 - 117 U/L 59  Albumin Latest Ref Range: 3.5 - 5.2 g/dL 3.9  AST Latest Ref Range: 0 - 37 U/L 17  ALT Latest Ref Range: 0 - 35 U/L 21  Total Protein Latest Ref Range: 6.0 - 8.3 g/dL 6.8  Total Bilirubin Latest Ref Range: 0.2 - 1.2 mg/dL 0.6  GFR Latest Ref Range: >60.00 mL/min 116.24  Total CHOL/HDL Ratio Unknown 4  Cholesterol Latest Ref Range: 0 - 200 mg/dL 222 (H)  HDL Cholesterol Latest Ref Range: >39.00 mg/dL 52.80  LDL (calc) Latest Ref Range: 0 - 99 mg/dL 142 (H)  MICROALB/CREAT RATIO Latest Ref Range: 0.0 - 30.0 mg/g 1.0  NonHDL Unknown 169.12  Triglycerides Latest Ref Range: 0.0 - 149.0 mg/dL 135.0  VLDL Latest Ref Range: 0.0 - 40.0 mg/dL 27.0  TSH Latest Ref Range: 0.35 - 5.50 uIU/mL 3.96  Creatinine,U Latest Units: mg/dL 67.5  Microalb, Ur Latest Ref Range: 0.0 - 1.9 mg/dL <0.7    ASSESSMENT / PLAN / RECOMMENDATIONS:   1) Type 1 Diabetes Mellitus, Poorly controlled, With retinopathic complications - Most recent A1c of 7.8 %. Goal A1c < 7.0 %.    - A1c down from 10.6 %  -I have praised the patient on improved glycemic control and encourage continued compliance and lifestyle changes. -I am going to increase her Trulicity, as she has been tolerating it well -I am also going to adjust her MDI regimen as below    MEDICATIONS: -Increase Trulicity 1.5 mg weekly  -Increase Tresiba 40 units daily  -Increase Fiasp 12 units with breakfast, 16 units with lunch, and 14 units with supper -Change CF ( BG-130/25)   EDUCATION / INSTRUCTIONS: BG monitoring instructions: Patient is instructed to check  her blood sugars 3 times a day, before meals  Call [facility] if: BG persistently < 70  I reviewed the Rule of 15 for the treatment of hypoglycemia in detail with the patient. Literature supplied.   2) Diabetic  complications:  Eye: Does  have known diabetic retinopathy.  Neuro/ Feet: Does not have known diabetic peripheral neuropathy. Renal: Patient does not have known baseline CKD. She is not on an ACEI/ARB at present.  3) Hashimoto's thyroiditis:   - She is clinically euthyroid  - No local neck symptoms  - Pt educated extensively on the correct way to take levothyroxine (first thing in the morning with water, 30 minutes before eating or taking other medications). - Pt encouraged to double dose the following day if she were to miss a dose given long half-life of levothyroxine. -TSH within normal range, no changes  Continue Levothyroxine 75 mcg daily   4. Dyslipidemia:   -Her triglycerides have trended down from 341 to 135 mg/DL, LDL is trending down as well. -No indication for statin therapy until the age of 16 at this time     F/U in 4 months   Signed electronically by: Mack Guise, MD  [facility]  [facility] [address] [place], [place] [address] Phone: [phone] FAX: [phone]   CC: Ann Held, DO [address] [place] [address] Phone: [phone]  Fax: [phone]    Return to Endocrinology clinic as below: No future appointments.

## 2021-05-28 NOTE — Patient Instructions (Addendum)
-   Keep Up the GOOD WORK ! A1c at 7.8 %  - Increase Trulicity 1.5 mg weekly  - Increase Tresiba 40 units daily  - Fiasp 14 units with Breakfast, 16 units with Lunch and 14 units with Supper  -Fiasp correctional insulin: ADD extra units on insulin to your meal-time Fiasp dose if your blood sugars are higher than 155. Use the scale below to help guide you:   Blood sugar before meal Number of units to inject  Less than 155 0 unit  156 -  180 1 units  181 -  205 2 units  206 -   230 3 units  231 -  255 4 units  256 -  280 5 units  281 -  305 6 units  306 -  330 7 units  331-  355 8 units  356 - 380 9 units   381 - 405 10 units     HOW TO TREAT LOW BLOOD SUGARS (Blood sugar LESS THAN 70 MG/DL) Please follow the RULE OF 15 for the treatment of hypoglycemia treatment (when your (blood sugars are less than 70 mg/dL)   STEP 1: Take 15 grams of carbohydrates when your blood sugar is low, which includes:  3-4 GLUCOSE TABS  OR 3-4 OZ OF JUICE OR REGULAR SODA OR ONE TUBE OF GLUCOSE GEL    STEP 2: RECHECK blood sugar in 15 MINUTES STEP 3: If your blood sugar is still low at the 15 minute recheck --> then, go back to STEP 1 and treat AGAIN with another 15 grams of carbohydrates.   c

## 2021-05-29 MED ORDER — LEVOTHYROXINE SODIUM 75 MCG PO TABS: 75.0000 ug | ORAL_TABLET | Freq: Every day | ORAL | 3 refills | Status: DC

## 2021-06-04 ENCOUNTER — Other Ambulatory Visit: Payer: Self-pay

## 2021-06-04 ENCOUNTER — Ambulatory Visit (HOSPITAL_BASED_OUTPATIENT_CLINIC_OR_DEPARTMENT_OTHER)
Admission: RE | Admit: 2021-06-04 | Discharge: 2021-06-04 | Disposition: A | Discharge status: Home / Self Care | Payer: BC Managed Care – PPO | Source: Ambulatory Visit | Attending: Medical | Admitting: Medical

## 2021-06-04 ENCOUNTER — Ambulatory Visit: Payer: BC Managed Care – PPO | Admitting: Medical

## 2021-06-04 VITALS — BP 125/80 | HR 87 | Resp 18 | Ht 67.0 in | Wt 188.0 lb

## 2021-06-04 DIAGNOSIS — M79645 Pain in left finger(s): Secondary | ICD-10-CM | POA: Diagnosis not present

## 2021-06-04 DIAGNOSIS — M542 Cervicalgia: Secondary | ICD-10-CM

## 2021-06-04 DIAGNOSIS — M79642 Pain in left hand: Secondary | ICD-10-CM | POA: Insufficient documentation

## 2021-06-04 DIAGNOSIS — M2550 Pain in unspecified joint: Secondary | ICD-10-CM | POA: Diagnosis not present

## 2021-06-04 MED ORDER — SULFAMETHOXAZOLE-TRIMETHOPRIM 800-160 MG PO TABS: 1.0000 | ORAL_TABLET | Freq: Two times a day (BID) | ORAL | 0 refills | Status: DC

## 2021-06-04 NOTE — Patient Instructions (Signed)
2 weeks of gradual left hand palm pain and finger pain.  Then within the last 24 hours inability to move third digit.  The third digit does look moderately swollen.  You can force flex the digit.  Considering rheumatologic condition, flexor tendon issue/injury or third digit infection.  Unclear at this point.  Some recent neck pain for months with possible radicular pain.  Some bilateral elbow pains at night.  Decided to get cervical spine x-ray, left hand x-ray, left third digit x-ray and inflammatory lab studies.  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  Hopefully they will be able to see you by next week.  I am not in the office next week so would asked that you call early in the week to see if hand specialist appointment made.  If you have worsening signs symptoms then recommend attempting to see PCP.  During the interim want you to use ibuprofen low-dose 200 to 400 mg every 8 hours and prescribed Bactrim DS antibiotic.

## 2021-06-04 NOTE — Progress Notes (Signed)
   Subjective:    Patient ID: Traci Rivas, female    DOB: 10-Jan-1991, 30 y.o.   MRN: [id number]  HPI Pt stats past 10 days she had soreness to 3rd digit dorsal aspect. Then yesterday finger got more and finger finger stiff. She can't flex the finger. She could use her rt hand and force finger down.   No insect bite or trauma. 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  Some pain in mid palm.   Pain in dominant hand/3rd digit.   Pt had some neck soreness for one year. Pt has seen chiropractor. Pt states rt hand fingers have been numb intermittently.  Also some recent random episodes of elbow pain at night.  Lmp- 2-3 weeks ago. On ocp.   Pt live in [place]. Planning to go back for work.    Review of Systems  See hpi.     Objective:   Physical Exam   General- No acute distress. Pleasant patient. Neck- Full range of motion, no jvd Lungs- Clear, even and unlabored. Heart- regular rate and rhythm. Neurologic- CNII- XII grossly intact.   Left hand- mild tender to palpation mid palm. Left 3rd digit looks mild swollen. No redness or warmth.        Assessment & Plan:   2 weeks of gradual left hand palm pain and finger pain.  Then within the last 24 hours inability to move third digit.  The third digit does look moderately swollen.  You can force flex the digit.  Considering rheumatologic condition, flexor tendon issue/injury or third digit infection.  Unclear at this point.  Some recent neck pain for months with possible radicular pain.  Some bilateral elbow pains at night.  Decided to get cervical spine x-ray, left hand x-ray, left third digit x-ray and inflammatory lab studies.  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  Hopefully they will be able to see you by next week.  I am not in the office next week so would asked that you call early in the week to see if hand specialist  appointment made.  If you have worsening signs symptoms then recommend attempting to see PCP.  During the interim want you to use ibuprofen low-dose 200 to 400 mg every 8 hours and prescribed Bactrim DS antibiotic.

## 2021-06-05 ENCOUNTER — Encounter: Payer: Self-pay | Admitting: Medical

## 2021-06-05 LAB — SEDIMENTATION RATE: Sed Rate: 19 mm/hr (ref 0–20)

## 2021-06-05 LAB — C-REACTIVE PROTEIN: CRP: <1 mg/dL (ref 0.5–20.0)

## 2021-06-06 LAB — RHEUMATOID FACTOR: Rheumatoid fact SerPl-aCnc: <14 IU/mL (ref <14)

## 2021-06-06 LAB — ANTI-NUCLEAR AB-TITER (ANA TITER): ANA Titer 1: 1:80 {titer} — ABNORMAL HIGH

## 2021-06-06 LAB — ANA: Anti Nuclear Antibody (ANA): POSITIVE — AB

## 2021-06-07 ENCOUNTER — Telehealth: Payer: Self-pay | Admitting: Medical

## 2021-06-07 DIAGNOSIS — R768 Other specified abnormal immunological findings in serum: Secondary | ICD-10-CM

## 2021-06-07 NOTE — Telephone Encounter (Signed)
Opened to place referral

## 2021-08-09 ENCOUNTER — Other Ambulatory Visit: Payer: Self-pay | Admitting: Family Medicine

## 2021-08-09 DIAGNOSIS — Z01419 Encounter for gynecological examination (general) (routine) without abnormal findings: Secondary | ICD-10-CM

## 2021-08-27 ENCOUNTER — Encounter: Payer: Self-pay | Admitting: Internal Medicine

## 2021-08-28 ENCOUNTER — Other Ambulatory Visit: Payer: Self-pay | Admitting: Internal Medicine

## 2021-08-28 MED ORDER — TRULICITY 3 MG/0.5ML ~~LOC~~ SOAJ: 3.0000 mg | SUBCUTANEOUS | 3 refills | Status: DC

## 2021-10-15 ENCOUNTER — Encounter: Payer: Self-pay | Admitting: Internal Medicine

## 2021-10-15 ENCOUNTER — Ambulatory Visit: Payer: BC Managed Care – PPO | Admitting: Internal Medicine

## 2021-10-15 ENCOUNTER — Encounter: Payer: Self-pay | Admitting: Medical

## 2021-10-15 VITALS — BP 118/74 | HR 84 | Ht 67.0 in | Wt 187.0 lb

## 2021-10-15 DIAGNOSIS — E063 Autoimmune thyroiditis: Secondary | ICD-10-CM | POA: Diagnosis not present

## 2021-10-15 DIAGNOSIS — E109 Type 1 diabetes mellitus without complications: Secondary | ICD-10-CM

## 2021-10-15 LAB — POCT GLYCOSYLATED HEMOGLOBIN (HGB A1C): Hemoglobin A1C: 7.6 % — AB (ref 4.0–5.6)

## 2021-10-15 LAB — TSH: TSH: 3.03 u[IU]/mL (ref 0.35–5.50)

## 2021-10-15 MED ORDER — TRULICITY 1.5 MG/0.5ML ~~LOC~~ SOAJ: 3.0000 mg | SUBCUTANEOUS | 3 refills | Status: DC

## 2021-10-15 MED ORDER — BARD PROTECTIVE BARRIER FILM MISC: 1.0000 | 3 refills | Status: AC | PRN

## 2021-10-15 MED ORDER — FIASP FLEXTOUCH 100 UNIT/ML ~~LOC~~ SOPN: PEN_INJECTOR | SUBCUTANEOUS | 3 refills | Status: DC

## 2021-10-15 MED ORDER — TRESIBA FLEXTOUCH 200 UNIT/ML ~~LOC~~ SOPN: 40.0000 [IU] | PEN_INJECTOR | Freq: Every day | SUBCUTANEOUS | 3 refills | Status: DC

## 2021-10-15 MED ORDER — INSULIN PEN NEEDLE 30G X 5 MM MISC: 1.0000 | Freq: Four times a day (QID) | 3 refills | Status: DC

## 2021-10-15 NOTE — Patient Instructions (Addendum)
°-   Continue Trulicity 3 mg weekly  - Continue Tresiba 40 units daily  -Change  Fiasp 12 units with Breakfast, 18 units with Lunch and 16 units with Supper  -Fiasp correctional insulin: ADD extra units on insulin to your meal-time Fiasp dose if your blood sugars are higher than 155. Use the scale below to help guide you:   Blood sugar before meal Number of units to inject  Less than 155 0 unit  156 -  180 1 units  181 -  205 2 units  206 -   230 3 units  231 -  255 4 units  256 -  280 5 units  281 -  305 6 units  306 -  330 7 units  331-  355 8 units  356 - 380 9 units   381 - 405 10 units     HOW TO TREAT LOW BLOOD SUGARS (Blood sugar LESS THAN 70 MG/DL) Please follow the RULE OF 15 for the treatment of hypoglycemia treatment (when your (blood sugars are less than 70 mg/dL)   STEP 1: Take 15 grams of carbohydrates when your blood sugar is low, which includes:  3-4 GLUCOSE TABS  OR 3-4 OZ OF JUICE OR REGULAR SODA OR ONE TUBE OF GLUCOSE GEL    STEP 2: RECHECK blood sugar in 15 MINUTES STEP 3: If your blood sugar is still low at the 15 minute recheck --> then, go back to STEP 1 and treat AGAIN with another 15 grams of carbohydrates.   c

## 2021-10-15 NOTE — Progress Notes (Signed)
Name: Traci Rivas  MRN/ DOB: [id number], 1990/12/18   Age/ Sex: 30 y.o., female    PCP: Ann Held, DO   Reason for Endocrinology Evaluation: Type 1 Diabetes Mellitus/ Hypothyroidism     Date of Initial Endocrinology Visit: 10/15/2021     PATIENT IDENTIFIER: Traci Rivas is a 30 y.o. female with a past medical history of T1DM and Hypothyroidism. The patient presented for initial endocrinology clinic visit on 10/15/2021 for consultative assistance with her diabetes management.     DIABETIC HISTORY:  Traci Rivas was diagnosed with T1DM in 2010 at age 4, has been on insulin since her diagnosis. Her hemoglobin A1c has ranged from 9.0% in 2016, peaking at 10.6% in 2019.   Metformin showed  no difference in glycemic control by taking it once a day     Transitioned care from Dr. Cruzita Lederer by 03/05/2021  Sister with T1 DM  Trulicity started 05/2422  HASHIMOTO'S THYROIDITIS:  Has been on LT-4 replacement for years. Has long history of non-compliance.   Mother with Thyroid disease   SUBJECTIVE:   During the last visit (05/28/2021): A1c 7.8 % We increased Trulicity, and adjusted MDI regimen     Today (10/15/21): Traci Rivas is here for a follow up on diabetes and hypothyroidism.   She checks her sugars blood sugars multiple  times daily, through CGM.The patient has not had hypoglycemic episodes since the last clinic visit.  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[place] , [place]   Denies nausea, vomiting or diarrhea    Has not been on Trulicity 3 mg due to supply shortage  She has a rash at the [place]: Trulicity 3 mg weekly  Tresiba 40 units daily  Fiasp 12 units with breakfast, 16 units with lunch, and 14 units with supper CF ( BG-130/25)  Levothyroxine 75 mcg daily      Statin: no ACE-I/ARB: no Prior Diabetic Education: yes   CONTINUOUS GLUCOSE MONITORING RECORD  INTERPRETATION    Dates of Recording: 12/7-12/20/2022  Sensor description: dexcom  Results statistics:   CGM use % of time 93  Average and SD 202/67  Time in range    39 %  % Time Above 180 37  % Time above 250 23  % Time Below target <1      Glycemic patterns summary: hyperglycemia noted during the day and at night , mainly post prandial   Hyperglycemic episodes  post prandial   Hypoglycemic episodes occurred rare post bolus   Overnight periods: stable      DIABETIC COMPLICATIONS: Microvascular complications:  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  Social History:  reports that she has never smoked. She has never used smokeless tobacco. She reports current alcohol use. She reports that she does not use drugs. Family History:  Family History  Problem Relation Age of Onset   Diabetes Father    Heart attack Paternal Grandfather    Heart disease Paternal Grandfather  Diabetes Sister    Hypothyroidism Sister    Cancer Paternal Grandmother        Colon     HOME MEDICATIONS: Allergies as of 10/15/2021       Reactions   Amoxicillin Rash   Did it involve swelling of the face/tongue/throat, SOB, or low BP? n Did it involve sudden or severe rash/hives, skin peeling, or any reaction on the inside of your mouth or nose? n Did you need to seek medical attention at a hospital or doctor's office? n When did it last happen? had reaction as a child If all above answers are NO, may proceed with cephalosporin use.   Penicillins Rash        Medication  List        Accurate as of October 15, 2021 10:35 AM. If you have any questions, ask your nurse or doctor.          STOP taking these medications    escitalopram 10 MG tablet Commonly known as: LEXAPRO Stopped by: Dorita Sciara, MD   sulfamethoxazole-trimethoprim 800-160 MG tablet Commonly known as: BACTRIM DS Stopped by: Dorita Sciara, MD   triamcinolone cream 0.1 % Commonly known as: KENALOG Stopped by: Dorita Sciara, MD       TAKE these medications    Ashlyna 0.15-0.03 &0.01 MG tablet Generic drug: Levonorgestrel-Ethinyl Estradiol Take 1 tablet by mouth once daily   Bayer Contour Next Monitor w/Device Kit Use to check sugar 3 times daily.   Bayer Microlet Lancets lancets Use as instructed to check sugar 3 times daily.   BIOTIN PO Take 1 tablet by mouth daily.   Dexcom G6 Sensor Misc 1 Device by Does not apply route as directed.   Dexcom G6 Transmitter Misc 1 Device by Does not apply route as directed.   Fiasp FlexTouch 100 UNIT/ML FlexTouch Pen Generic drug: insulin aspart Max daily 80 units   glucagon 1 MG injection Inject 1 mg into the skin once as needed. Follow package directions for low blood sugar.   glucose blood test strip Commonly known as: Visual merchandiser Next Test Use as instructed to check sugar 4 times daily.   Insulin Pen Needle 30G X 5 MM Misc 1 Device by Does not apply route in the morning, at noon, in the evening, and at bedtime.   levothyroxine 75 MCG tablet Commonly known as: SYNTHROID Take 1 tablet (75 mcg total) by mouth daily before breakfast.   naproxen sodium 220 MG tablet Commonly known as: ALEVE Take 220 mg by mouth daily as needed (pain).   Tyler Aas FlexTouch 200 UNIT/ML FlexTouch Pen Generic drug: insulin degludec Inject 40 Units into the skin daily.   Trulicity 3 BR/8.3EN Sopn Generic drug: Dulaglutide Inject 3 mg as directed once a week.   Vitamin D (Ergocalciferol) 1.25 MG (50000 UNIT)  Caps capsule Commonly known as: DRISDOL Take 1 capsule (50,000 Units total) by mouth every 7 (seven) days.         ALLERGIES: Allergies  Allergen Reactions   Amoxicillin Rash    Did it involve swelling of the face/tongue/throat, SOB, or low BP? n Did it involve sudden or severe rash/hives, skin peeling, or any reaction on the inside of your mouth or nose? n Did you need to seek medical attention at a hospital or doctor's office? n When did it last happen? had reaction as a child If all above answers are NO, may proceed with cephalosporin use.   Penicillins Rash  REVIEW OF SYSTEMS: A comprehensive ROS was conducted with the patient and is negative except as per HPI    OBJECTIVE:   VITAL SIGNS: BP 118/74 (BP Location: Left Arm, Patient Position: Sitting, Cuff Size: Small)    Pulse 84    Ht _0  (1.702 m)    Wt 137 lb (62.1 kg)    SpO2 98%    BMI 21.46 kg/m    PHYSICAL EXAM:  General: Pt appears well and is in NAD  Neck:  Thyroid: Thyroid size normal.   Lungs: Clear with good BS bilat with no rales, rhonchi, or wheezes  Heart: RRR with normal S1 and S2 and no gallops; no murmurs; no rub  Abdomen: Normoactive bowel sounds, soft, nontender, without masses or organomegaly palpable  Extremities:  Lower extremities - No pretibial edema. No lesions.  Neuro: MS is good with appropriate affect, pt is alert and Ox3    DM foot exam: 03/05/2021 The skin of the feet is intact without sores or ulcerations. The pedal pulses are 2+ on right and 2+ on left. The sensation is intact to a screening 5.07, 10 gram monofilament bilaterally   DATA REVIEWED:  Lab Results  Component Value Date   HGBA1C 7.6 (A) 10/15/2021   HGBA1C 7.8 (A) 05/28/2021   HGBA1C 10.6 (A) 03/05/2021    Latest Reference Range & Units 10/15/21 11:03  TSH 0.35 - 5.50 uIU/mL 3.03   Results for ELNITA, SURPRENANT (MRN [id number]) as of 05/29/2021 12:48  Ref. Range 05/28/2021 11:20  Sodium Latest Ref Range: 135  - 145 mEq/L 136  Potassium Latest Ref Range: 3.5 - 5.1 mEq/L 4.5  Chloride Latest Ref Range: 96 - 112 mEq/L 101  CO2 Latest Ref Range: 19 - 32 mEq/L 25  Glucose Latest Ref Range: 70 - 99 mg/dL 167 (H)  BUN Latest Ref Range: 6 - 23 mg/dL 9  Creatinine Latest Ref Range: 0.40 - 1.20 mg/dL 0.70  Calcium Latest Ref Range: 8.4 - 10.5 mg/dL 9.1  Alkaline Phosphatase Latest Ref Range: 39 - 117 U/L 59  Albumin Latest Ref Range: 3.5 - 5.2 g/dL 3.9  AST Latest Ref Range: 0 - 37 U/L 17  ALT Latest Ref Range: 0 - 35 U/L 21  Total Protein Latest Ref Range: 6.0 - 8.3 g/dL 6.8  Total Bilirubin Latest Ref Range: 0.2 - 1.2 mg/dL 0.6  GFR Latest Ref Range: >60.00 mL/min 116.24  Total CHOL/HDL Ratio Unknown 4  Cholesterol Latest Ref Range: 0 - 200 mg/dL 222 (H)  HDL Cholesterol Latest Ref Range: >39.00 mg/dL 52.80  LDL (calc) Latest Ref Range: 0 - 99 mg/dL 142 (H)  MICROALB/CREAT RATIO Latest Ref Range: 0.0 - 30.0 mg/g 1.0  NonHDL Unknown 169.12  Triglycerides Latest Ref Range: 0.0 - 149.0 mg/dL 135.0  VLDL Latest Ref Range: 0.0 - 40.0 mg/dL 27.0  Creatinine,U Latest Units: mg/dL 67.5  Microalb, Ur Latest Ref Range: 0.0 - 1.9 mg/dL <0.7    ASSESSMENT / PLAN / RECOMMENDATIONS:   1) Type 1 Diabetes Mellitus, Poorly controlled, With retinopathic complications - Most recent A1c of 7.6 %. Goal A1c < 7.0 %.    - Glycemic control is improving, she understands the A1c goal is 7.0 %  -She is tolerating trulicity without side effects- no change  - I will adjust her prandial insulin as below    MEDICATIONS: -Continue Trulicity 3  mg weekly  -Continue Tresiba 40 units daily  -Change Fiasp 12 units with breakfast, 18 units with lunch, and  16 units with supper -  CF ( BG-130/25)   EDUCATION / INSTRUCTIONS: BG monitoring instructions: Patient is instructed to check her blood sugars 3 times a day, before meals  Call [facility] if: BG persistently < 70  I reviewed the Rule of 15 for the  treatment of hypoglycemia in detail with the patient. Literature supplied.   2) Diabetic complications:  Eye: Does  have known diabetic retinopathy.  Neuro/ Feet: Does not have known diabetic peripheral neuropathy. Renal: Patient does not have known baseline CKD. She is not on an ACEI/ARB at present.  3) Hashimoto's thyroiditis:   - She is clinically euthyroid  - No local neck symptoms  - TSH is normal    Continue Levothyroxine 75 mcg daily      4. Dyslipidemia:   -Her triglycerides have trended down from 341 to 135 mg/DL by 05/2021, LDL is trending down as well. -No indication for statin therapy until the age of 68      F/U in 4 months   Signed electronically by: Mack Guise, MD  [facility]  [facility] [address] [place], [place] [address] Phone: [phone] FAX: [phone]   CC: Ann Held, DO [address] [place] [address] Phone: [phone]  Fax: [phone]    Return to Endocrinology clinic as below: No future appointments.

## 2021-10-16 MED ORDER — LEVOTHYROXINE SODIUM 75 MCG PO TABS: 75.0000 ug | ORAL_TABLET | Freq: Every day | ORAL | 3 refills | Status: DC

## 2021-11-03 IMAGING — DX DG CERVICAL SPINE COMPLETE 4+V
6 series · 6 of 6 positions shown · non-contrast
Comparison: None.

CLINICAL DATA: Neck pain with occasional possible radicular pain.

EXAM:
CERVICAL SPINE - COMPLETE 4+ VIEW

[c-spine lat]
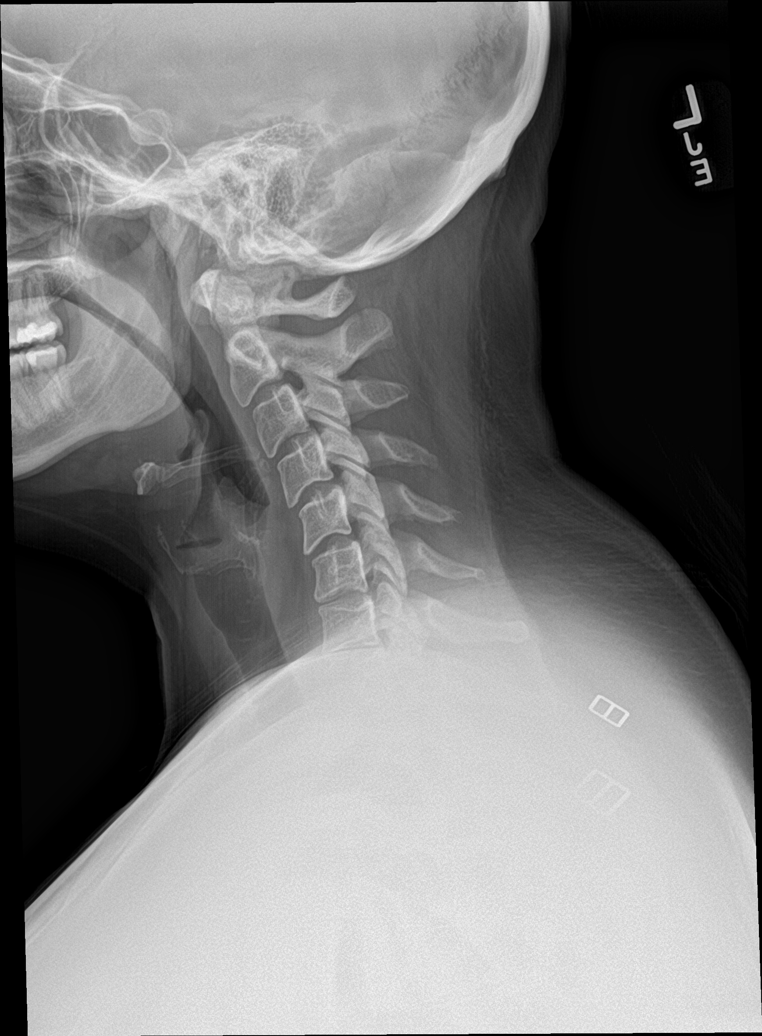

[c-spine obl (1 of 2)]
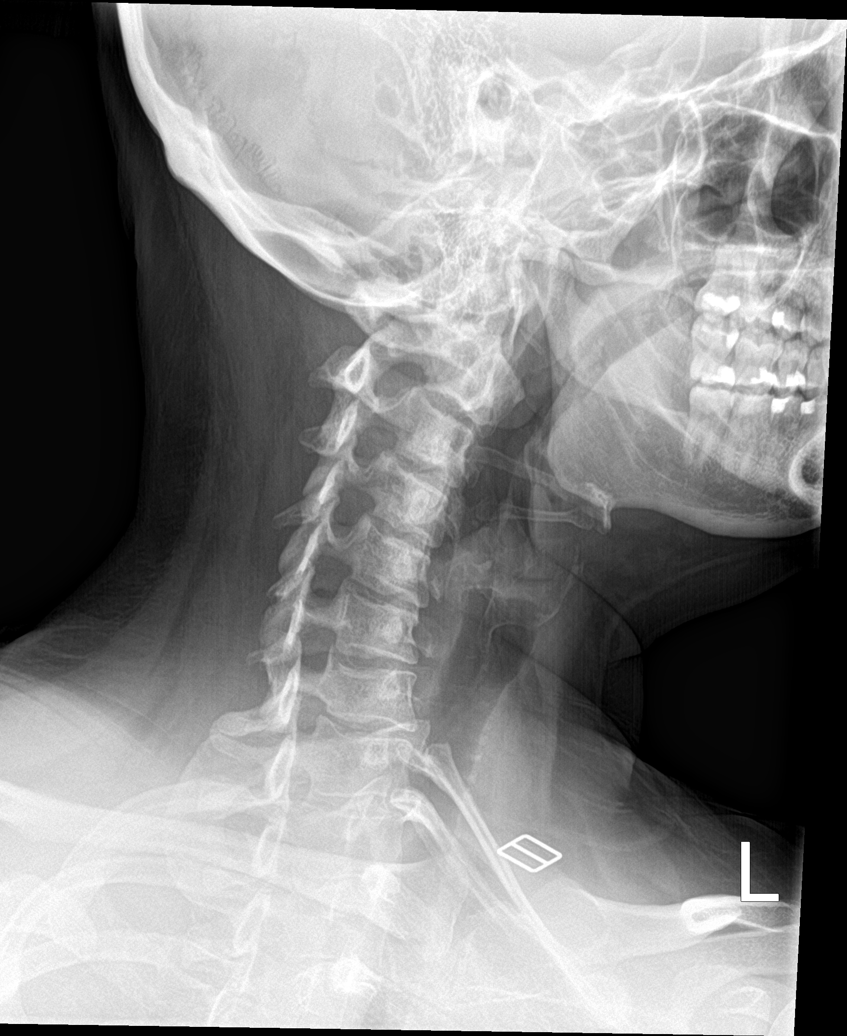

[c-spine obl (2 of 2)]
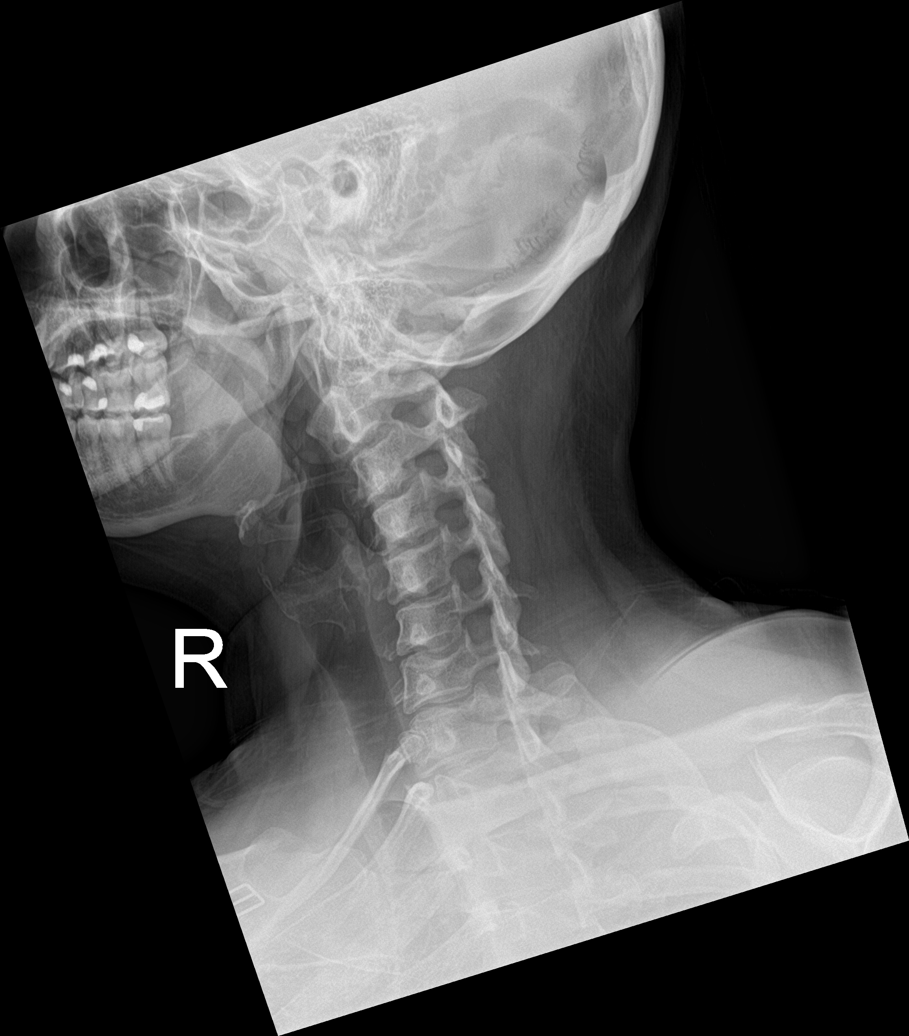

[c-spine ap]
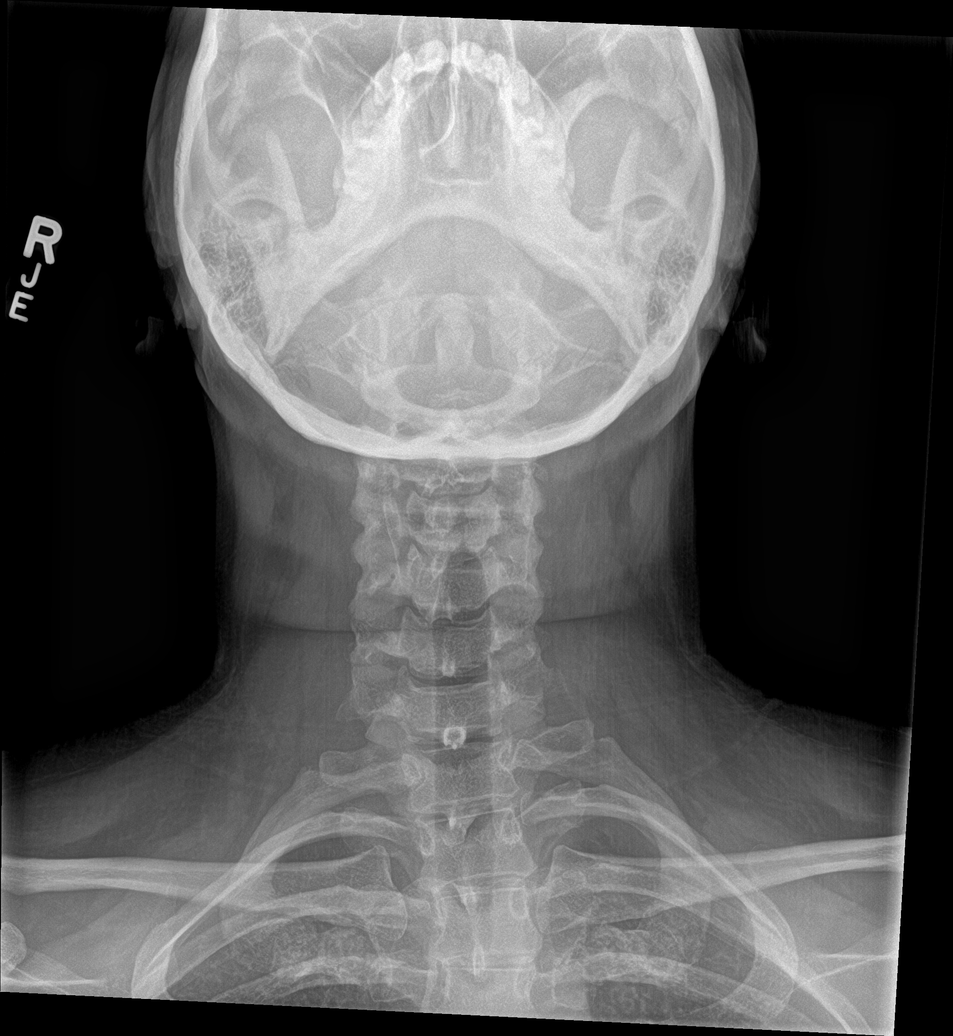

[c-spine open mouth]
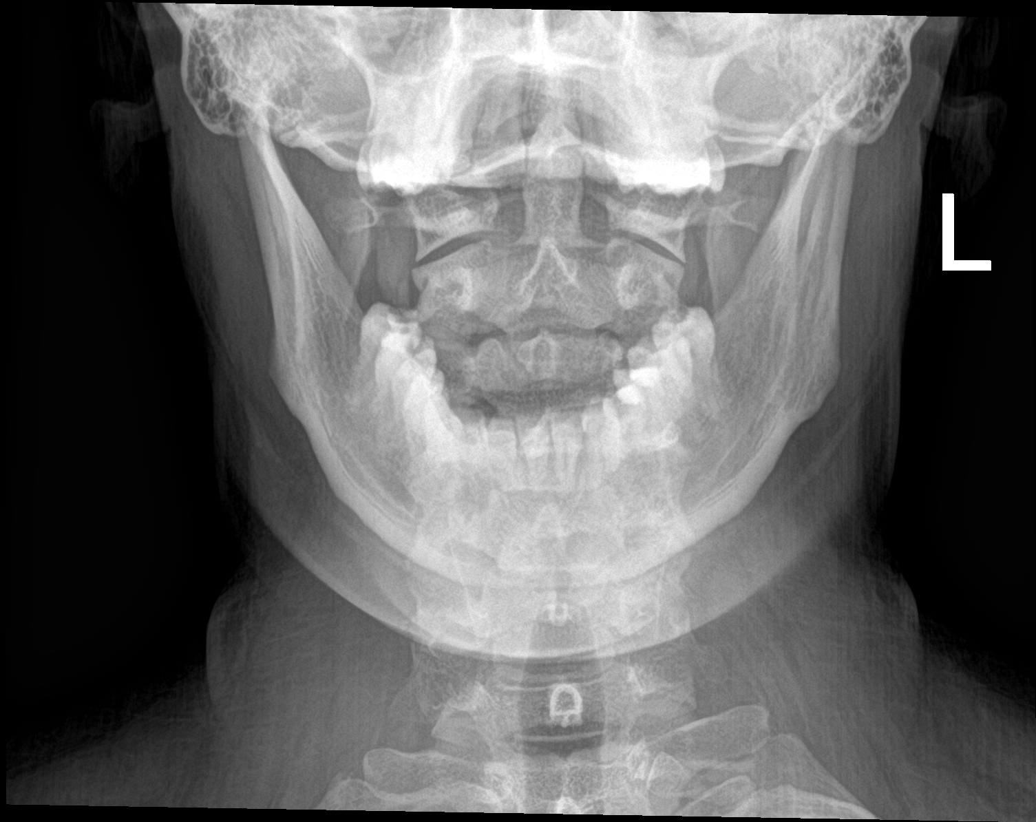

[c-spine swimmers]
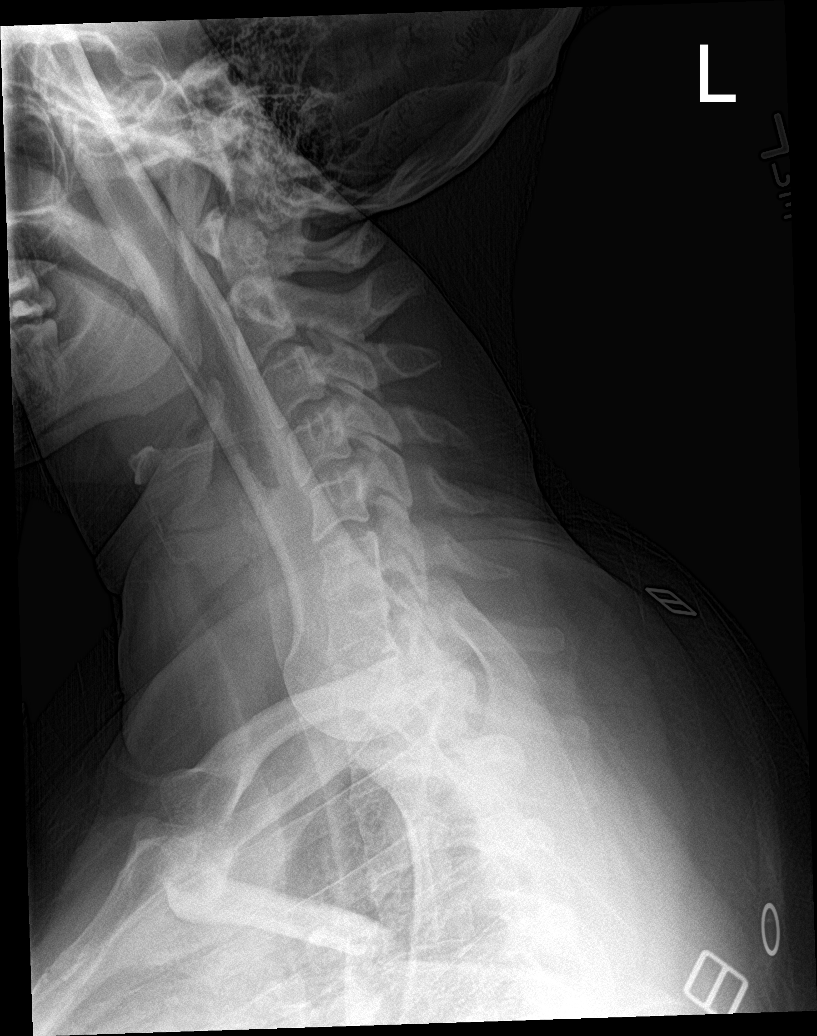

[6 of 6 positions shown; findings below may reference images not displayed]

FINDINGS: Mild broad-based reversal of normal lordosis. No listhesis.
Vertebral body heights are normal. The disc spaces are preserved.
There is no bony neural foraminal narrowing. No evidence of
fracture, erosion, or focal bone abnormality. No prevertebral soft
tissue edema. Included lung apices are clear.
IMPRESSION: Mild broad-based reversal of normal lordosis, can be seen with
muscle spasm. Otherwise unremarkable cervical spine radiographs.

## 2021-11-03 IMAGING — DX DG HAND 2V*L*
2 series · 2 of 2 positions shown · non-contrast
Comparison: None.

CLINICAL DATA: Mild third digit swelling and inability to flex.
Mild pain mid aspect of palm. Gradual onset of symptoms over 2
weeks.

EXAM:
LEFT HAND - 2 VIEW; LEFT MIDDLE FINGER 2+V

[hand pa]
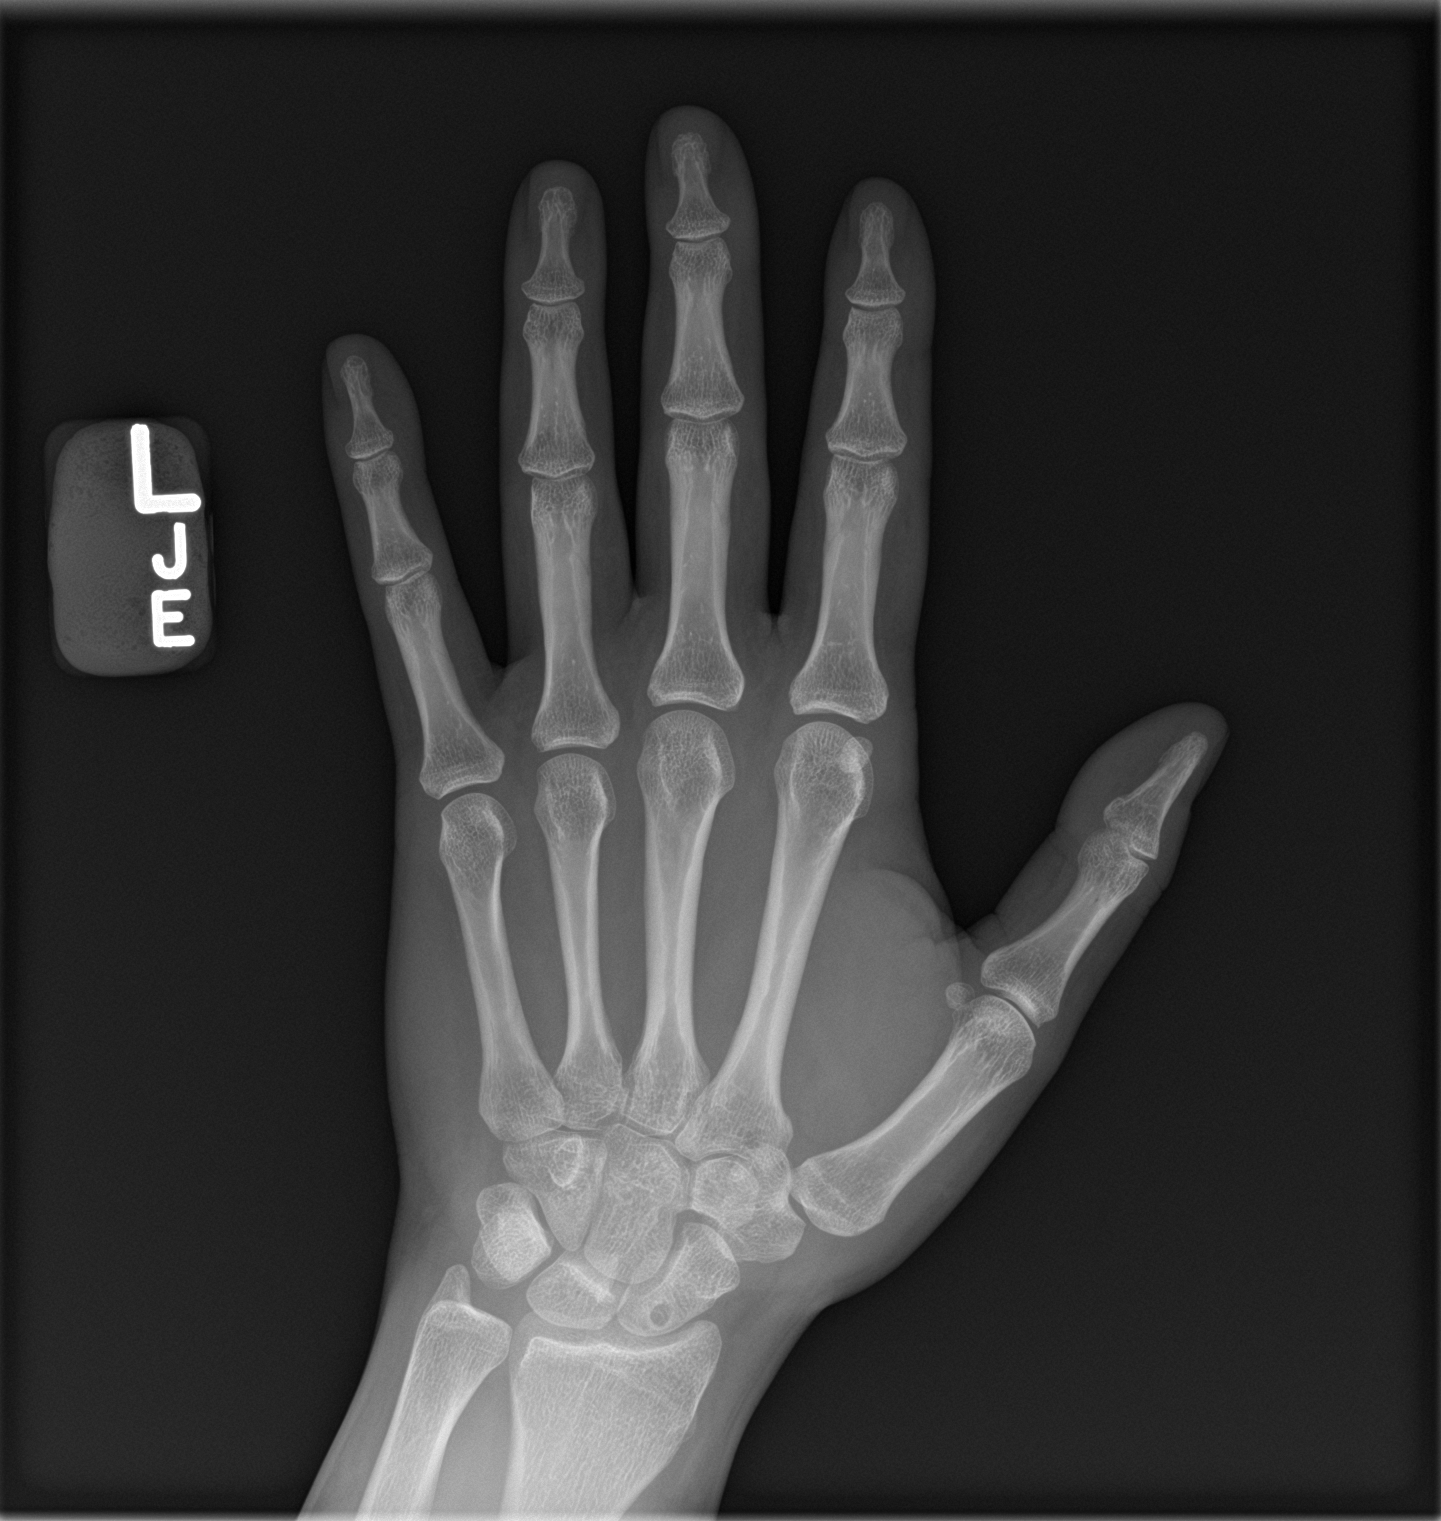

[hand lat]
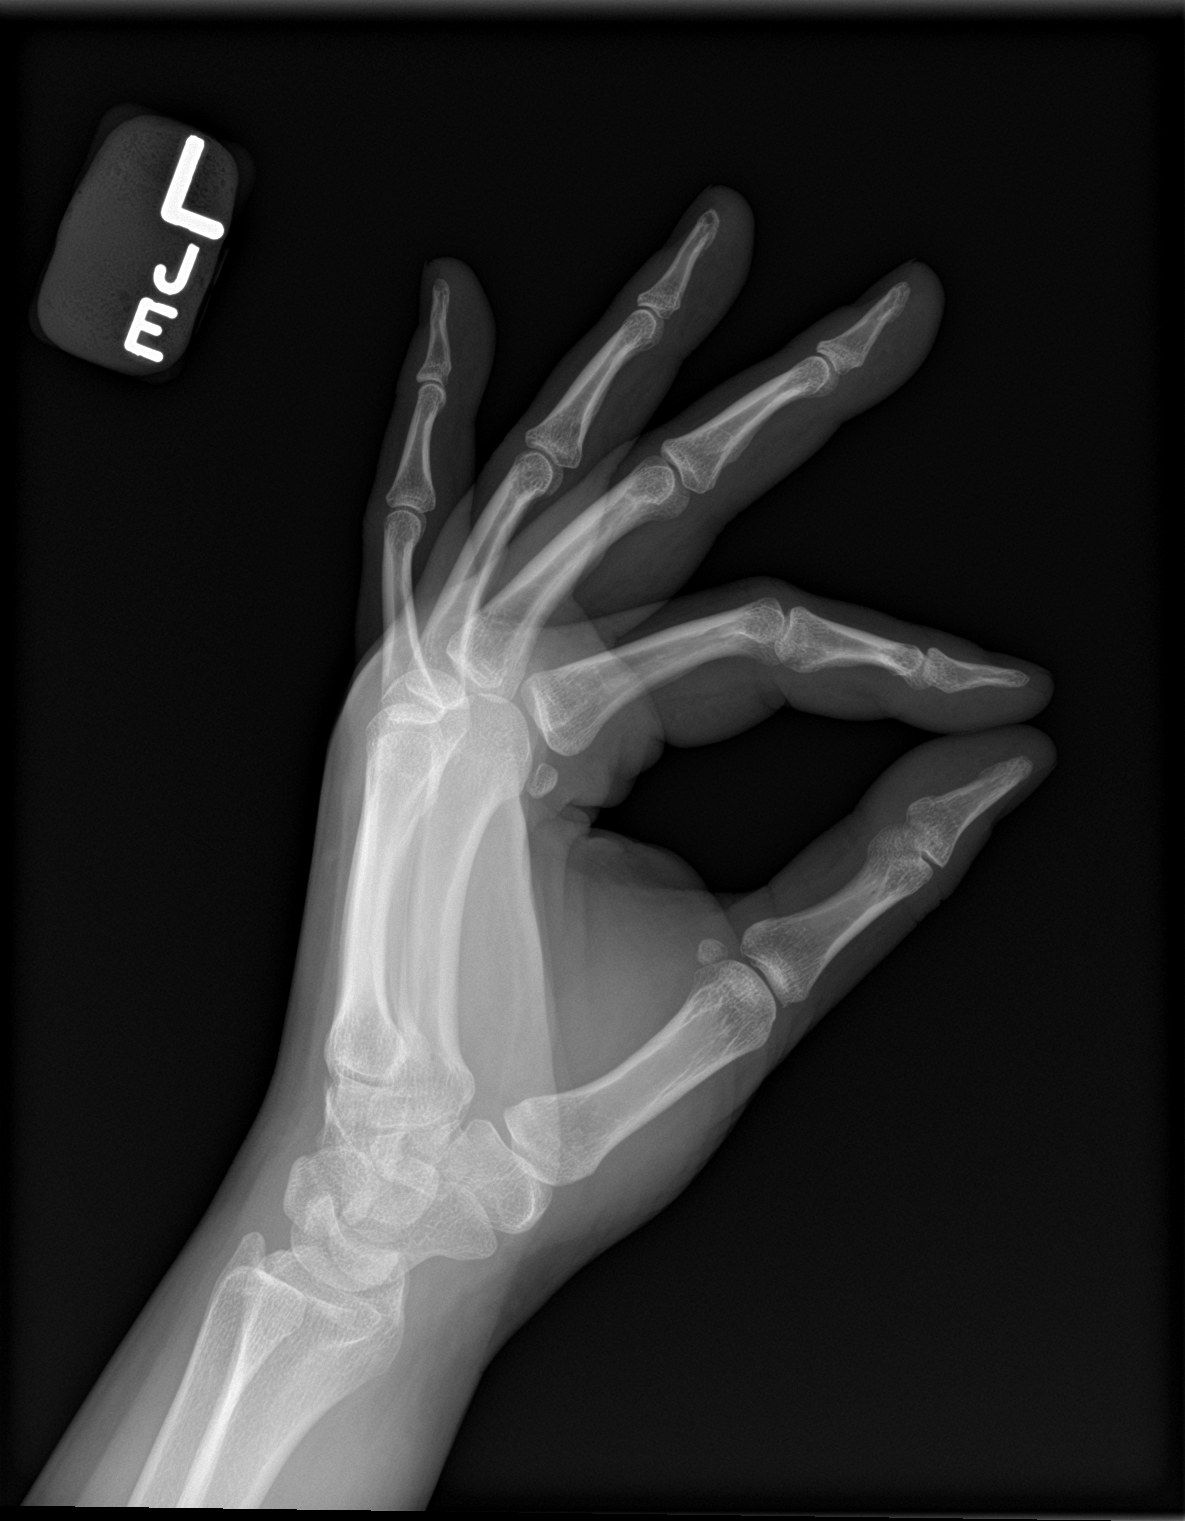

[2 of 2 positions shown; findings below may reference images not displayed]

FINDINGS: Left middle finger: Normal alignment. Normal joint spaces. No
fracture, erosion, periosteal reaction or focal bone abnormality.
Unremarkable soft tissues.

Left hand: Normal alignment. Normal joint spaces. Small carpal bone
cyst in the proximal scaphoid, incidental. No erosion, periosteal
reaction, or bony destruction. Unremarkable soft tissues.
IMPRESSION: 1. Unremarkable radiographs of the middle finger. No explanation for
pain.
2. Small cyst in the scaphoid is likely incidental. Otherwise
unremarkable radiographs of the left hand.

## 2021-12-11 ENCOUNTER — Encounter: Payer: Self-pay | Admitting: Internal Medicine

## 2021-12-12 ENCOUNTER — Other Ambulatory Visit: Payer: Self-pay | Admitting: Internal Medicine

## 2021-12-12 DIAGNOSIS — E109 Type 1 diabetes mellitus without complications: Secondary | ICD-10-CM

## 2021-12-12 MED ORDER — TRULICITY 1.5 MG/0.5ML ~~LOC~~ SOAJ: 3.0000 mg | SUBCUTANEOUS | 3 refills | Status: DC

## 2021-12-18 ENCOUNTER — Other Ambulatory Visit (HOSPITAL_COMMUNITY): Payer: Self-pay

## 2021-12-18 ENCOUNTER — Other Ambulatory Visit: Payer: Self-pay | Admitting: Internal Medicine

## 2021-12-18 ENCOUNTER — Telehealth: Payer: Self-pay

## 2021-12-18 MED ORDER — SEMAGLUTIDE (2 MG/DOSE) 8 MG/3ML ~~LOC~~ SOPN: 2.0000 mg | PEN_INJECTOR | SUBCUTANEOUS | 3 refills | Status: DC

## 2021-12-18 NOTE — Telephone Encounter (Signed)
Patient would like to know if there is anything else she can do until she is able to get Trulicity. Patient sugars have been spiking. Message has been sent to prior authorization team to process PA.

## 2021-12-18 NOTE — Telephone Encounter (Signed)
Mychart message sent to patient and vm left

## 2021-12-18 NOTE — Telephone Encounter (Signed)
I do not see an active script. I ran a test bill for 1.5mg  but it comes up as a refill too soon, not a PA. Test bill ran for 3mg  return a co-pay amount of $20. Trulicity is once a week. No PA needed.

## 2021-12-19 ENCOUNTER — Other Ambulatory Visit (HOSPITAL_COMMUNITY): Payer: Self-pay

## 2021-12-27 ENCOUNTER — Other Ambulatory Visit: Payer: Self-pay | Admitting: Internal Medicine

## 2021-12-27 MED ORDER — OZEMPIC (1 MG/DOSE) 4 MG/3ML ~~LOC~~ SOPN: 1.0000 mg | PEN_INJECTOR | SUBCUTANEOUS | 1 refills | Status: DC

## 2021-12-27 NOTE — Progress Notes (Signed)
Intolerant to Ozempic 2 mg, will send the 1 mg  ?

## 2022-02-23 ENCOUNTER — Other Ambulatory Visit: Payer: Self-pay | Admitting: Internal Medicine

## 2022-02-24 ENCOUNTER — Encounter: Payer: Self-pay | Admitting: Internal Medicine

## 2022-03-28 ENCOUNTER — Encounter: Payer: Self-pay | Admitting: Internal Medicine

## 2022-04-03 ENCOUNTER — Other Ambulatory Visit: Payer: Self-pay | Admitting: Internal Medicine

## 2022-04-10 ENCOUNTER — Other Ambulatory Visit: Payer: Self-pay | Admitting: Internal Medicine

## 2022-05-07 ENCOUNTER — Other Ambulatory Visit: Payer: Self-pay | Admitting: Internal Medicine

## 2022-05-08 ENCOUNTER — Other Ambulatory Visit: Payer: Self-pay | Admitting: Internal Medicine

## 2022-06-09 ENCOUNTER — Other Ambulatory Visit: Payer: Self-pay | Admitting: Internal Medicine

## 2022-06-09 ENCOUNTER — Ambulatory Visit (INDEPENDENT_AMBULATORY_CARE_PROVIDER_SITE_OTHER): Payer: BC Managed Care – PPO | Admitting: Family Medicine

## 2022-06-09 ENCOUNTER — Encounter: Payer: Self-pay | Admitting: Family Medicine

## 2022-06-09 VITALS — BP 100/80 | HR 105 | Temp 98.2°F | Resp 18 | Ht 67.0 in | Wt 180.6 lb

## 2022-06-09 DIAGNOSIS — Z0000 Encounter for general adult medical examination without abnormal findings: Secondary | ICD-10-CM | POA: Diagnosis not present

## 2022-06-09 DIAGNOSIS — E109 Type 1 diabetes mellitus without complications: Secondary | ICD-10-CM

## 2022-06-09 DIAGNOSIS — E063 Autoimmune thyroiditis: Secondary | ICD-10-CM | POA: Diagnosis not present

## 2022-06-09 DIAGNOSIS — Z01419 Encounter for gynecological examination (general) (routine) without abnormal findings: Secondary | ICD-10-CM | POA: Diagnosis not present

## 2022-06-09 MED ORDER — LEVONORGEST-ETH ESTRAD 91-DAY 0.15-0.03 &0.01 MG PO TABS: 1.0000 | ORAL_TABLET | Freq: Every day | ORAL | 3 refills | Status: DC

## 2022-06-09 NOTE — Progress Notes (Signed)
Subjective:   By signing my name below, I, Traci Rivas, attest that this documentation has been prepared under the direction and in the presence of Ann Held DO 06/09/2022   Patient ID: Traci Rivas, female    DOB: 01/07/1991, 31 y.o.   MRN: [id number]  Chief Complaint  Patient presents with   Annual Exam    HPI Patient is in today for a comprehensive physical exam  She is requesting a refill of 0.15-0.03&0.01 Mg of Ashlyna.   She states that her A1C levels have previously gone down but has not decreased since. She is regularly following up with her endocrinologist and reports that she is getting her labs on 06/10/2022 Lab Results  Component Value Date   HGBA1C 7.6 (A) 10/15/2021   She has been wanting to lose more weight. She is currently taking 1 Mg of Ozempic. The first dose of Ozempic that was given to her was 2 Mg which caused her symptoms since decreasing her dosage to 1 Mg, she reports no more symptoms.  Wt Readings from Last 3 Encounters:  06/09/22 180 lb 9.6 oz (81.9 kg)  10/15/21 187 lb (84.8 kg)  06/04/21 188 lb (85.3 kg)   She denies having any fever, new muscle pain, joint pain , new moles, congestion, sinus pain, sore throat, chest pain, palpations, cough, SOB ,wheezing,n/v/d constipation, blood in stool, dysuria, frequency, hematuria, at this time  Pap Smear last completed on 05/10/2020 She reports that she has received a Covid-19 booster.  She is maintaining a healthy diet.  She is exercising regularly.   Past Medical History:  Diagnosis Date   Diabetes mellitus    type 1   Dysmenorrhea    Hypoglycemia associated with diabetes ([place])    Hypothyroidism, acquired, autoimmune    Thyroiditis, autoimmune     No past surgical history on file.  Family History  Problem Relation Age of Onset   Diabetes Father    Heart attack Paternal Grandfather    Heart disease Paternal Grandfather    Diabetes Sister    Hypothyroidism Sister    Cancer  Paternal Grandmother        Colon    Social History   Socioeconomic History   Marital status: Single    Spouse name: Not on file   Number of children: Not on file   Years of education: Not on file   Highest education level: Not on file  Occupational History   Occupation: [employer]  Tobacco Use   Smoking status: Never   Smokeless tobacco: Never  Vaping Use   Vaping Use: Never used  Substance and Sexual Activity   Alcohol use: Yes    Alcohol/week: 0.0 standard drinks of alcohol    Comment: rare   Drug use: No   Sexual activity: Not Currently    Partners: Male    Birth control/protection: Condom, Pill  Other Topics Concern   Not on file  Social History Narrative   Not on file   Social Determinants of Health   Financial Resource Strain: Not on file  Food Insecurity: Not on file  Transportation Needs: Not on file  Physical Activity: Not on file  Stress: Not on file  Social Connections: Not on file  Intimate Partner Violence: Not on file    Outpatient Medications Prior to Visit  Medication Sig Dispense Refill   BAYER MICROLET LANCETS lancets Use as instructed to check sugar 3 times daily. 200 each 5   Blood Glucose Monitoring  Suppl (BAYER CONTOUR NEXT MONITOR) w/Device KIT Use to check sugar 3 times daily. 1 kit 0   Continuous Blood Gluc Sensor (DEXCOM G6 SENSOR) MISC USE AS DIRECTED EVERY  10  DAYS 3 each 3   Continuous Blood Gluc Transmit (DEXCOM G6 TRANSMITTER) MISC USE EVERY 90 DAYS 1 each 0   glucagon 1 MG injection Inject 1 mg into the skin once as needed. Follow package directions for low blood sugar. 1 each 11   Incontinence Supplies (BARD PROTECTIVE BARRIER FILM) MISC 1 Device by Does not apply route as needed. To be applied before Dexcom application 50 each 3   insulin aspart (FIASP FLEXTOUCH) 100 UNIT/ML FlexTouch Pen Max daily 100 units 90 mL 3   insulin degludec (TRESIBA FLEXTOUCH) 200 UNIT/ML FlexTouch Pen Inject 40 Units into the skin daily. 30 mL 3    Insulin Pen Needle 30G X 5 MM MISC 1 Device by Does not apply route in the morning, at noon, in the evening, and at bedtime. 400 each 3   levothyroxine (SYNTHROID) 75 MCG tablet Take 1 tablet (75 mcg total) by mouth daily before breakfast. 90 tablet 3   OZEMPIC, 1 MG/DOSE, 4 MG/3ML SOPN INJECT 1MG SUBCUTANEOUSLY ONCE A WEEK 3 mL 0   Levonorgestrel-Ethinyl Estradiol (ASHLYNA) 0.15-0.03 &0.01 MG tablet Take 1 tablet by mouth once daily 84 tablet 3   naproxen sodium (ALEVE) 220 MG tablet Take 220 mg by mouth daily as needed (pain).     Vitamin D, Ergocalciferol, (DRISDOL) 1.25 MG (50000 UT) CAPS capsule Take 1 capsule (50,000 Units total) by mouth every 7 (seven) days. (Patient not taking: Reported on 10/15/2021) 12 capsule 0   No facility-administered medications prior to visit.    Allergies  Allergen Reactions   Amoxicillin Rash    Did it involve swelling of the face/tongue/throat, SOB, or low BP? n Did it involve sudden or severe rash/hives, skin peeling, or any reaction on the inside of your mouth or nose? n Did you need to seek medical attention at a hospital or doctor's office? n When did it last happen? had reaction as a child If all above answers are "NO", may proceed with cephalosporin use.   Penicillins Rash    Review of Systems  Constitutional:  Negative for fever.  HENT:  Negative for congestion, sinus pain and sore throat.   Respiratory:  Negative for cough, shortness of breath and wheezing.   Cardiovascular:  Negative for chest pain and palpitations.  Gastrointestinal:  Negative for blood in stool, constipation, diarrhea, nausea and vomiting.  Genitourinary:  Negative for dysuria, frequency and hematuria.  Musculoskeletal:  Negative for joint pain and myalgias.  Skin:        (-) New Moles       Objective:    Physical Exam Constitutional:      General: She is not in acute distress.    Appearance: Normal appearance. She is not ill-appearing.  HENT:     Head:  Normocephalic and atraumatic.     Right Ear: Tympanic membrane, ear canal and external ear normal.     Left Ear: Tympanic membrane, ear canal and external ear normal.  Eyes:     Extraocular Movements: Extraocular movements intact.     Pupils: Pupils are equal, round, and reactive to light.  Cardiovascular:     Rate and Rhythm: Normal rate and regular rhythm.     Heart sounds: Normal heart sounds. No murmur heard.    No gallop.  Pulmonary:  Effort: Pulmonary effort is normal. No respiratory distress.     Breath sounds: Normal breath sounds. No wheezing or rales.  Abdominal:     General: Bowel sounds are normal. There is no distension.     Palpations: Abdomen is soft.     Tenderness: There is no abdominal tenderness. There is no guarding.  Skin:    General: Skin is warm and dry.  Neurological:     Mental Status: She is alert and oriented to person, place, and time.  Psychiatric:        Judgment: Judgment normal.     BP 100/80 (BP Location: Left Arm, Patient Position: Sitting, Cuff Size: Normal)   Pulse (!) 105   Temp 98.2 F (36.8 C) (Oral)   Resp 18   Ht _0  (1.702 m)   Wt 180 lb 9.6 oz (81.9 kg)   LMP 05/05/2022   SpO2 98%   BMI 28.29 kg/m  Wt Readings from Last 3 Encounters:  06/09/22 180 lb 9.6 oz (81.9 kg)  10/15/21 187 lb (84.8 kg)  06/04/21 188 lb (85.3 kg)    Diabetic Foot Exam - Simple   No data filed    Lab Results  Component Value Date   WBC 9.2 05/10/2020   HGB 15.2 (H) 05/10/2020   HCT 44.4 05/10/2020   PLT 280.0 05/10/2020   GLUCOSE 167 (H) 05/28/2021   CHOL 222 (H) 05/28/2021   TRIG 135.0 05/28/2021   HDL 52.80 05/28/2021   LDLDIRECT 157.0 05/10/2020   LDLCALC 142 (H) 05/28/2021   ALT 21 05/28/2021   AST 17 05/28/2021   NA 136 05/28/2021   K 4.5 05/28/2021   CL 101 05/28/2021   CREATININE 0.70 05/28/2021   BUN 9 05/28/2021   CO2 25 05/28/2021   TSH 3.03 10/15/2021   HGBA1C 7.6 (A) 10/15/2021   MICROALBUR <0.7 05/28/2021    Lab  Results  Component Value Date   TSH 3.03 10/15/2021   Lab Results  Component Value Date   WBC 9.2 05/10/2020   HGB 15.2 (H) 05/10/2020   HCT 44.4 05/10/2020   MCV 93.8 05/10/2020   PLT 280.0 05/10/2020   Lab Results  Component Value Date   NA 136 05/28/2021   K 4.5 05/28/2021   CO2 25 05/28/2021   GLUCOSE 167 (H) 05/28/2021   BUN 9 05/28/2021   CREATININE 0.70 05/28/2021   BILITOT 0.6 05/28/2021   ALKPHOS 59 05/28/2021   AST 17 05/28/2021   ALT 21 05/28/2021   PROT 6.8 05/28/2021   ALBUMIN 3.9 05/28/2021   CALCIUM 9.1 05/28/2021   ANIONGAP 9 04/03/2019   GFR 116.24 05/28/2021   Lab Results  Component Value Date   CHOL 222 (H) 05/28/2021   Lab Results  Component Value Date   HDL 52.80 05/28/2021   Lab Results  Component Value Date   LDLCALC 142 (H) 05/28/2021   Lab Results  Component Value Date   TRIG 135.0 05/28/2021   Lab Results  Component Value Date   CHOLHDL 4 05/28/2021   Lab Results  Component Value Date   HGBA1C 7.6 (A) 10/15/2021       Assessment & Plan:   Problem List Items Addressed This Visit       Unprioritized   Type 1 diabetes mellitus not at goal [facility])    Per endo      Preventative health care - Primary    ghm utd See avs  rto 1 year  Labs to be done with  endo       Hypothyroidism, acquired, autoimmune    Per endo      Other Visit Diagnoses     Encounter for routine gynecological examination       Relevant Medications   Levonorgestrel-Ethinyl Estradiol (ASHLYNA) 0.15-0.03 &0.01 MG tablet       Meds ordered this encounter  Medications   Levonorgestrel-Ethinyl Estradiol (ASHLYNA) 0.15-0.03 &0.01 MG tablet    Sig: Take 1 tablet by mouth daily.    Dispense:  84 tablet    Refill:  3    I, Ann Held, DO, personally preformed the services described in this documentation.  All medical record entries made by the scribe were at my direction and in my presence.  I have reviewed the chart and discharge  instructions (if applicable) and agree that the record reflects my personal performance and is accurate and complete. 06/09/2022   I,Amber Collins,acting as a scribe for Ann Held, DO.,have documented all relevant documentation on the behalf of Ann Held, DO,as directed by  Ann Held, DO while in the presence of Ann Held, DO.    Ann Held, DO

## 2022-06-09 NOTE — Assessment & Plan Note (Signed)
Per endo °

## 2022-06-09 NOTE — Patient Instructions (Signed)

## 2022-06-09 NOTE — Assessment & Plan Note (Signed)
ghm utd See avs  rto 1 year  Labs to be done with endo

## 2022-06-10 ENCOUNTER — Encounter: Payer: Self-pay | Admitting: Internal Medicine

## 2022-06-10 ENCOUNTER — Ambulatory Visit: Payer: BC Managed Care – PPO | Admitting: Internal Medicine

## 2022-06-10 VITALS — BP 124/76 | HR 92 | Ht 67.0 in | Wt 181.0 lb

## 2022-06-10 DIAGNOSIS — E109 Type 1 diabetes mellitus without complications: Secondary | ICD-10-CM

## 2022-06-10 DIAGNOSIS — E063 Autoimmune thyroiditis: Secondary | ICD-10-CM | POA: Diagnosis not present

## 2022-06-10 LAB — TSH: TSH: 5.16 u[IU]/mL (ref 0.35–5.50)

## 2022-06-10 LAB — COMPREHENSIVE METABOLIC PANEL
ALT: 27 U/L (ref 0–35)
AST: 26 U/L (ref 0–37)
Albumin: 3.9 g/dL (ref 3.5–5.2)
Alkaline Phosphatase: 64 U/L (ref 39–117)
BUN: 7 mg/dL (ref 6–23)
CO2: 25 mEq/L (ref 19–32)
Calcium: 9 mg/dL (ref 8.4–10.5)
Chloride: 102 mEq/L (ref 96–112)
Creatinine, Ser: 0.69 mg/dL (ref 0.40–1.20)
GFR: 115.8 mL/min (ref >60.00)
Glucose, Bld: 120 mg/dL — ABNORMAL HIGH (ref 70–99)
Potassium: 4.1 mEq/L (ref 3.5–5.1)
Sodium: 136 mEq/L (ref 135–145)
Total Bilirubin: 0.5 mg/dL (ref 0.2–1.2)
Total Protein: 6.9 g/dL (ref 6.0–8.3)

## 2022-06-10 LAB — LIPID PANEL
Cholesterol: 196 mg/dL (ref 0–200)
HDL: 46 mg/dL (ref >39.00)
LDL Cholesterol: 123 mg/dL — ABNORMAL HIGH (ref 0–99)
NonHDL: 149.68
Total CHOL/HDL Ratio: 4
Triglycerides: 135 mg/dL (ref 0.0–149.0)
VLDL: 27 mg/dL (ref 0.0–40.0)

## 2022-06-10 LAB — POCT GLYCOSYLATED HEMOGLOBIN (HGB A1C): Hemoglobin A1C: 7.9 % — AB (ref 4.0–5.6)

## 2022-06-10 LAB — MICROALBUMIN / CREATININE URINE RATIO
Creatinine,U: 68 mg/dL
Microalb Creat Ratio: 1 mg/g (ref 0.0–30.0)
Microalb, Ur: <0.7 mg/dL (ref 0.0–1.9)

## 2022-06-10 NOTE — Progress Notes (Unsigned)
Name: Traci Rivas  MRN/ DOB: [id number], [birth year]   Age/ Sex: 31 y.o., female    PCP: Carollee Herter, Alferd Apa, DO   Reason for Endocrinology Evaluation: Type 1 Diabetes Mellitus/ Hypothyroidism     Date of Initial Endocrinology Visit: 06/10/2022     PATIENT IDENTIFIER: Traci Rivas is a 31 y.o. female with a past medical history of T1DM and Hypothyroidism. The patient presented for initial endocrinology clinic visit on 06/10/2022 for consultative assistance with her diabetes management.     DIABETIC HISTORY:  Traci Rivas was diagnosed with T1DM in 2010 at age 61, has been on insulin since her diagnosis. Her hemoglobin A1c has ranged from 9.0% in 2016, peaking at 10.6% in 2019.   Metformin showed  no difference in glycemic control by taking it once a day     Transitioned care from Dr. Cruzita Lederer by 03/05/2021  Sister with T1 DM  Trulicity started 0/7371  HASHIMOTO'S THYROIDITIS:  Has been on LT-4 replacement for years. Has long history of non-compliance.   Mother with Thyroid disease   SUBJECTIVE:   During the last visit (10/15/2021): A1c 7.6 % We continued Trulicity, and adjusted MDI regimen     Today (06/10/22): Traci Rivas is here for a follow up on diabetes and hypothyroidism.   She checks her sugars blood sugars multiple  times daily, through CGM.The patient has not had hypoglycemic episodes since the last clinic visit.   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   She had an episode of nausea and vomiting in June , she was advised to take PPI by on call provider , which as resolved      She has a rash at the [facility] but this is imprving with barrier    HOME ENDOCRINE  REGIMEN: Ozempic 1 mg weekly  Tresiba 40 units daily  Fiasp 12 units with breakfast, 16 units with lunch, and 14 units with supper CF ( BG-130/25)  Levothyroxine 75 mcg daily      Statin: no ACE-I/ARB: no Prior Diabetic Education: yes   CONTINUOUS GLUCOSE MONITORING  RECORD INTERPRETATION    Dates of Recording: 8/2-8/15/2023  Sensor description: dexcom  Results statistics:   CGM use % of time 71  Average and SD 205/71  Time in range   42%  % Time Above 180 30  % Time above 250 27  % Time Below target <1      Glycemic patterns summary: hyperglycemia noted during the day  , mainly post prandial   Hyperglycemic episodes  post prandial   Hypoglycemic episodes occurred rare post bolus   Overnight periods: stable at the upper limit of normal      DIABETIC COMPLICATIONS: Microvascular complications:  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  Social History:  reports that she has never smoked. She has never used smokeless tobacco. She reports current alcohol use. She reports that she does not use drugs. Family History:  Family History  Problem Relation Age of Onset   Diabetes Father    Heart attack Paternal  Grandfather    Heart disease Paternal Grandfather    Diabetes Sister    Hypothyroidism Sister    Cancer Paternal Grandmother        Colon     HOME MEDICATIONS: Allergies as of 06/10/2022       Reactions   Amoxicillin Rash   Did it involve swelling of the face/tongue/throat, SOB, or low BP? n Did it involve sudden or severe rash/hives, skin peeling, or any reaction on the inside of your mouth or nose? n Did you need to seek medical attention at a hospital or doctor's office? n When did it last happen? had reaction as a child If all above answers are "NO", may proceed with cephalosporin use.   Penicillins Rash         Medication List        Accurate as of June 10, 2022  9:32 AM. If you have any questions, ask your nurse or doctor.          Bard Protective Barrier Film Misc 1 Device by Does not apply route as needed. To be applied before Dexcom Engineer, agricultural Next Monitor w/Device Kit Use to check sugar 3 times daily.   Bayer Microlet Lancets lancets Use as instructed to check sugar 3 times daily.   Dexcom G6 Sensor Misc USE AS DIRECTED EVERY  10  DAYS   Dexcom G6 Transmitter Misc USE EVERY 90 DAYS   Fiasp FlexTouch 100 UNIT/ML FlexTouch Pen Generic drug: insulin aspart Max daily 100 units   glucagon 1 MG injection Inject 1 mg into the skin once as needed. Follow package directions for low blood sugar.   Insulin Pen Needle 30G X 5 MM Misc 1 Device by Does not apply route in the morning, at noon, in the evening, and at bedtime.   Levonorgestrel-Ethinyl Estradiol 0.15-0.03 &0.01 MG tablet Commonly known as: Ashlyna Take 1 tablet by mouth daily.   levothyroxine 75 MCG tablet Commonly known as: SYNTHROID Take 1 tablet (75 mcg total) by mouth daily before breakfast.   Ozempic (1 MG/DOSE) 4 MG/3ML Sopn Generic drug: Semaglutide (1 MG/DOSE) INJECT 1MG SUBCUTANEOUSLY ONCE A WEEK   Tresiba FlexTouch 200 UNIT/ML FlexTouch Pen Generic drug: insulin degludec Inject 40 Units into the skin daily.         ALLERGIES: Allergies  Allergen Reactions   Amoxicillin Rash    Did it involve swelling of the face/tongue/throat, SOB, or low BP? n Did it involve sudden or severe rash/hives, skin peeling, or any reaction on the inside of your mouth or nose? n Did you need to seek medical attention at a hospital or doctor's office? n When did it last happen? had reaction as a child If all above answers are "NO", may proceed with cephalosporin use.   Penicillins Rash     REVIEW OF SYSTEMS: A comprehensive ROS was conducted with the patient and is negative except as per  HPI    OBJECTIVE:   VITAL SIGNS: BP 124/76 (BP Location: Left Arm, Patient Position: Sitting, Cuff Size: Small)   Pulse 92   Ht _0  (1.702 m)   Wt 181 lb (82.1 kg)   LMP 05/05/2022   SpO2 95%   BMI 28.35 kg/m    PHYSICAL EXAM:  General: Pt appears well and is in NAD  Neck:  Thyroid: Thyroid size normal.   Lungs: Clear with good BS bilat with no rales, rhonchi, or wheezes  Heart: RRR with normal S1 and S2 and no  gallops; no murmurs; no rub  Abdomen: Normoactive bowel sounds, soft, nontender, without masses or organomegaly palpable  Extremities:  Lower extremities - No pretibial edema. No lesions.  Neuro: MS is good with appropriate affect, pt is alert and Ox3    DM foot exam: 06/10/2022 The skin of the feet is intact without sores or ulcerations. The pedal pulses are 2+ on right and 2+ on left. The sensation is intact to a screening 5.07, 10 gram monofilament bilaterally   DATA REVIEWED:  Lab Results  Component Value Date   HGBA1C 7.9 (A) 06/10/2022   HGBA1C 7.6 (A) 10/15/2021   HGBA1C 7.8 (A) 05/28/2021     ASSESSMENT / PLAN / RECOMMENDATIONS:   1) Type 1 Diabetes Mellitus, Poorly controlled, With retinopathic complications - Most recent A1c of 7.9 %. Goal A1c < 7.0 %.    -Her A1c continues to be above goal -She is intolerant to higher doses of Ozempic 2 mg -We discussed pump technology, she is interested in the tandem, a referral has been placed to our CDE for tandem training -In the meantime I will increase her basal insulin based on Dexcom download -I am also going to adjust her prandial dose of insulin as below as well as her correction factor  MEDICATIONS: -Continue Ozempic 1 mg weekly  -Increase Tresiba 44 units daily  -Change Fiasp 14 units with breakfast, 18 units with lunch, and 16 units with supper -Change correction factor: Fiasp ( BG-130/30)   EDUCATION / INSTRUCTIONS: BG monitoring instructions: Patient is instructed to check her blood sugars 3  times a day, before meals  Call [facility] if: BG persistently < 70  I reviewed the Rule of 15 for the treatment of hypoglycemia in detail with the patient. Literature supplied.   2) Diabetic complications:  Eye: Does  have known diabetic retinopathy.  Neuro/ Feet: Does not have known diabetic peripheral neuropathy. Renal: Patient does not have known baseline CKD. She is not on an ACEI/ARB at present.  3) Hashimoto's thyroiditis:   - She is clinically euthyroid  - No local neck symptoms  - TSH is elevated, will increase levothyroxine as below  Medication Stop levothyroxine 75 mcg daily  Start levothyroxine 88 mcg daily    4. Dyslipidemia:   -Her lipid panel has improved over the past year -No indication for statin therapy until the age of 75    5.  Weight gain:  -The patient is interested in more weight loss, we discussed following up with the weight loss management clinic as an option -She is intolerant to higher doses of Ozempic -We discussed the importance of following a certain diet consistently such as weight watchers or Noom -I have advised her to avoid snacking -She was encouraged to perform moderate activity exercise 175 minutes a week (such as brisk walking)  F/U in 4 months   Signed electronically by: Mack Guise, MD  [facility]  [address] [place], [place] [address] Phone: [phone] FAX: [phone]   CC: Ann Held, DO [address] [place] [address] Phone: [phone]  Fax: [phone]    Return to Endocrinology clinic as below: Future Appointments  Date Time Provider [place]  06/11/2023  1:00 PM Ann Held, DO [facility]

## 2022-06-10 NOTE — Patient Instructions (Addendum)
-   Continue Ozempic 1 mg weekly  - Increase  Tresiba 44 units daily  -Change  Fiasp 14 units with Breakfast, 18 units with Lunch and 16 units with Supper  -Fiasp correctional insulin: ADD extra units on insulin to your meal-time Fiasp dose if your blood sugars are higher than 160. Use the scale below to help guide you:   Blood sugar before meal Number of units to inject  Less than 160 0 unit  161 - 190 1 units  191 - 220 2 units  221 - 250 3 units  251 - 280 4 units  281 - 310 5 units  311 - 340 6 units  341 - 370 7 units  371 - 400 8 units    HOW TO TREAT LOW BLOOD SUGARS (Blood sugar LESS THAN 70 MG/DL) Please follow the RULE OF 15 for the treatment of hypoglycemia treatment (when your (blood sugars are less than 70 mg/dL)   STEP 1: Take 15 grams of carbohydrates when your blood sugar is low, which includes:  3-4 GLUCOSE TABS  OR 3-4 OZ OF JUICE OR REGULAR SODA OR ONE TUBE OF GLUCOSE GEL    STEP 2: RECHECK blood sugar in 15 MINUTES STEP 3: If your blood sugar is still low at the 15 minute recheck --> then, go back to STEP 1 and treat AGAIN with another 15 grams of carbohydrates.   c

## 2022-06-11 ENCOUNTER — Encounter: Payer: Self-pay | Admitting: Internal Medicine

## 2022-06-11 ENCOUNTER — Telehealth: Payer: Self-pay

## 2022-06-11 LAB — HM DIABETES EYE EXAM

## 2022-06-11 MED ORDER — TRESIBA FLEXTOUCH 200 UNIT/ML ~~LOC~~ SOPN: 44.0000 [IU] | PEN_INJECTOR | Freq: Every day | SUBCUTANEOUS | 3 refills | Status: DC

## 2022-06-11 MED ORDER — LEVOTHYROXINE SODIUM 88 MCG PO TABS: 88.0000 ug | ORAL_TABLET | Freq: Every day | ORAL | 3 refills | Status: DC

## 2022-06-11 MED ORDER — FIASP FLEXTOUCH 100 UNIT/ML ~~LOC~~ SOPN: PEN_INJECTOR | SUBCUTANEOUS | 3 refills | Status: DC

## 2022-06-11 MED ORDER — INSULIN PEN NEEDLE 30G X 5 MM MISC: 1.0000 | Freq: Four times a day (QID) | 3 refills | Status: DC

## 2022-06-11 MED ORDER — OZEMPIC (1 MG/DOSE) 4 MG/3ML ~~LOC~~ SOPN: 1.0000 mg | PEN_INJECTOR | SUBCUTANEOUS | 3 refills | Status: DC

## 2022-06-11 NOTE — Telephone Encounter (Signed)
Pharmacy would like to verify the direction on the Ozempic should be 3 times a week

## 2022-07-07 ENCOUNTER — Other Ambulatory Visit: Payer: Self-pay | Admitting: Internal Medicine

## 2022-09-01 ENCOUNTER — Telehealth: Payer: Self-pay | Admitting: Licensed Clinical Social Worker

## 2022-09-01 NOTE — Patient Outreach (Signed)
  Care Coordination   09/01/2022 Name: Traci Rivas MRN: [id number] DOB: [birth year]   Care Coordination Outreach Attempts:  An unsuccessful telephone outreach was attempted today to offer the patient information about available care coordination services as a benefit of their health plan.   Follow Up Plan:  Additional outreach attempts will be made to offer the patient care coordination information and services.   Encounter Outcome:  No Answer  Care Coordination Interventions Activated:  No   Care Coordination Interventions:  No, not indicated    Casimer Lanius, [place] [phone]

## 2022-09-08 ENCOUNTER — Encounter: Payer: Self-pay | Admitting: Internal Medicine

## 2022-10-01 ENCOUNTER — Telehealth: Payer: Self-pay | Admitting: Nutrition

## 2022-10-01 NOTE — Telephone Encounter (Signed)
She left me a voice message that her pump and dexcom were in, and needed to schedule training. Left message to call me today to schedule this.

## 2022-10-03 MED ORDER — FIASP 100 UNIT/ML IJ SOLN: INTRAMUSCULAR | 3 refills | Status: DC

## 2022-10-08 ENCOUNTER — Encounter: Payer: BC Managed Care – PPO | Admitting: Nutrition

## 2022-10-08 ENCOUNTER — Telehealth: Payer: Self-pay | Admitting: Nutrition

## 2022-10-08 ENCOUNTER — Other Ambulatory Visit: Payer: Self-pay | Admitting: Internal Medicine

## 2022-10-08 NOTE — Telephone Encounter (Signed)
Patient is here to start her Control IQ insulin pump.  She was sent a basal IQ insulin pump.  I explained the difference and she wants the Control IQ.  [facility] care notified and pump to be returned and a Control IQ be sent to her.  Dr. Lonzo Cloud signed the order for this and it was faxed to [facility].  They will contact patient and arrange for a replacement.  Latessa will contact me when she gets her new pump, and we can start training.

## 2022-10-22 ENCOUNTER — Ambulatory Visit: Payer: BC Managed Care – PPO | Admitting: Nutrition

## 2022-10-28 ENCOUNTER — Encounter: Payer: BC Managed Care – PPO | Attending: Internal Medicine | Admitting: Nutrition

## 2022-10-28 ENCOUNTER — Ambulatory Visit: Payer: Self-pay | Admitting: Licensed Clinical Social Worker

## 2022-10-28 DIAGNOSIS — E109 Type 1 diabetes mellitus without complications: Secondary | ICD-10-CM | POA: Insufficient documentation

## 2022-10-28 NOTE — Patient Outreach (Signed)
  Care Coordination  Initial Visit Note   10/28/2022 Name: Traci Rivas MRN: [id number] DOB: 1991-07-13  Traci Rivas is a 32 y.o. year old female who sees Carollee Herter, Alferd Apa, DO for primary care. I spoke with  Traci Rivas by phone today.  What matters to the patients health and wellness today?   Not interested in Care Coordination services .    SDOH assessments and interventions completed:  No   Care Coordination Interventions:  No, not indicated   Follow up plan: No further intervention required.   Encounter Outcome:  Pt. Visit Completed   Casimer Lanius, [place] [phone]

## 2022-10-28 NOTE — Patient Instructions (Signed)
  It was a pleasure speaking with you today.  Care Coordination provides support specific to your health needs that extend beyond exceptional routine office care you already receive from your primary care doctor.    If you are eligible for standard Care Coordination, there is no cost to you.  The Care Coordination team is made up of the following team members: Registered Nurse Care Guide: disease management, health education, care coordination and complex case management Clinical Social Work: Complex Care Coordination including coordination of level of care needs, mental and behavioral health assessment and recommendations, and connection to long-term mental health support Clinical Pharmacist: medication management, assistance and disease management Community Resource Care Guides: community resource connections Scheduling Team: dedicated team of scheduling professionals to support patient and clinical team scheduling needs  Please call [phone] if you would like to schedule a phone appointment with one of the team members.   Traci Mallen, LCSW Social Work Care Coordination  [facility] [phone]  

## 2022-10-31 ENCOUNTER — Telehealth: Payer: Self-pay | Admitting: Nutrition

## 2022-10-31 NOTE — Telephone Encounter (Signed)
Patient reported no difficulty giving boluses, or using the pump.  Reports FBS today was 113.  She had no questions for me at this time.

## 2022-10-31 NOTE — Progress Notes (Signed)
Traci Rivas was trained on the tandem control IQ insulin pump.  Settings were put into the pump by the patient per Dr. Quin Hoop orders.  Basal rate: 1.35u/hr, I/C: 12, ISF: 30, target: 110, timing: 4 hours, max basal:3.0u/hr, max bolus: 20u.  We discussed how this pump delivers insulin and how/when to give a bolus and correction dose bolus.  She re demonstrated each of these correctly X2 with no assistance from me.  She reported not needing any assistance with carb counting.  She filled a pod with Fiasp insulin and attached a 9 mm autosoft 90 infusion set with 23 inch tubing.  I explained that in more than 50% of the cases, the Fiasp insulin will only last for 2 days, with breakdown occurring due to plastic in the cartridge.  She agreed to look at her blood sugar readings on the 3rd day to see if they start to rise, and to call the office to get switched to Lymjev insulin.   We linked her Dexcom G6 to her pump, and the clarity to [facility].  We also linked her pump to [facility]. We discussed how to start/stop the pump, high blood sugar protocols, and sick day guidelines.  Handouts were given on all of the above topics and another appointment was made for review of all of the above topics in one week.  She had no final questions.

## 2022-10-31 NOTE — Patient Instructions (Addendum)
Read over all handouts given and manuel Call Tandem help line if questions about pump usage Call Dexcom help line if questions about sensor usage  Monitor blood sugars on the 3rd day to see if readings go up.  Call office if this is the case to switch to Lilly insulin.

## 2022-11-03 ENCOUNTER — Ambulatory Visit: Payer: BC Managed Care – PPO | Admitting: Internal Medicine

## 2022-11-05 ENCOUNTER — Encounter: Payer: BC Managed Care – PPO | Admitting: Nutrition

## 2022-11-05 DIAGNOSIS — E109 Type 1 diabetes mellitus without complications: Secondary | ICD-10-CM

## 2022-11-05 NOTE — Patient Instructions (Signed)
Read over handouts and manual and call pump help line if questions. Call Dexcom help line if questions.

## 2022-11-05 NOTE — Progress Notes (Signed)
Patient reports that she is loviing this pump, and has had no difficulty changing cartridges, wearing the pump, bolusing ac all meals and snacks, and responding to alerts and alarms.  The download shows patient is 70% in target range, with blood sugars going up pcL and S.  She has been off work for 1 month, and will start back to school and work, and feels that she will be eating less and exercising more.  Discussed the need to take more insulin acL and supper if blood sugars do not come down to pre meal levels in 4 hours.  We reviewed the training checklist, and she signed off as understanding all topics with no final questions.

## 2022-11-18 ENCOUNTER — Encounter: Payer: Self-pay | Admitting: Family Medicine

## 2023-01-05 ENCOUNTER — Other Ambulatory Visit: Payer: Self-pay | Admitting: Internal Medicine

## 2023-02-19 ENCOUNTER — Other Ambulatory Visit (HOSPITAL_COMMUNITY): Payer: Self-pay

## 2023-02-19 ENCOUNTER — Telehealth: Payer: Self-pay

## 2023-02-19 NOTE — Telephone Encounter (Signed)
Patient Advocate Encounter   Received notification from [facility] that prior authorization is required for Ozempic (1 MG/DOSE) /3ML pen-injectors  Submitted: 02/19/23 Key [id number]  Status is pending

## 2023-02-23 NOTE — Telephone Encounter (Signed)
Pharmacy Patient Advocate Encounter  Received notification that the request for prior authorization has been denied due to:      They won't approve because she doesn't have a diagnosis of type 2 diabetes

## 2023-03-16 NOTE — Progress Notes (Unsigned)
Name: Traci Rivas  MRN/ DOB: [id number], Sep 27, 1991   Age/ Sex: 32 y.o., female    PCP: Zola Button, Grayling Congress, DO   Reason for Endocrinology Evaluation: Type 1 Diabetes Mellitus/ Hypothyroidism     Date of Initial Endocrinology Visit: 03/18/2023     PATIENT IDENTIFIER: Traci Rivas is a 32 y.o. female with a past medical history of T1DM and Hypothyroidism. The patient presented for initial endocrinology clinic visit on 03/18/2023 for consultative assistance with her diabetes management.     DIABETIC HISTORY:  Traci Rivas was diagnosed with T1DM in 2010 at age 79, has been on insulin since her diagnosis. Her hemoglobin A1c has ranged from 9.0% in 2016, peaking at 10.6% in 2019.   Metformin showed  no difference in glycemic control by taking it once a day     Transitioned care from Dr. Elvera Lennox by 03/05/2021  Sister with T1 DM  Trulicity started 02/2021   She was trained on insulin pump 10/2022   HASHIMOTO'S THYROIDITIS:  Has been on LT-4 replacement for years. Has long history of non-compliance.   Mother with Thyroid disease   SUBJECTIVE:   During the last visit (06/10/2022): A1c 7.9 %    Today (03/18/23): Traci Rivas is here for a follow up on diabetes and hypothyroidism.   She checks her sugars blood sugars multiple  times daily, through CGM.The patient has not had hypoglycemic episodes since the last clinic visit.   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 During the visit the pump basal and bolus doses were reviewed including carb/insulin rations and supplemental doses. The clinical list was updated. The glucose meter download was reviewed in detail to determine if the current pump settings are providing the  best glycemic control without excessive hypoglycemia.  Pump and meter download:    Pump   Tandem Settings   Insulin type   Fiasp   Basal rate       0000 1.350u/h               I:C ratio       0000 1:1    14/16/18 meals    6 g with snacks           Sensitivity       0000  30       Goal       0000  110              Body mass index is 32.42 kg/m.  PUMP STATISTICS: Average BG: 199  Average Daily Carbs (g): 78  Average Total Daily Insulin: 133.08  Average Daily Basal: 45 (34 %) Average Daily Bolus: 86 (66 %)     HOME ENDOCRINE  REGIMEN: Fiasp Levothyroxine 88 mcg daily      Statin: no ACE-I/ARB: no Prior Diabetic Education: yes   CONTINUOUS GLUCOSE MONITORING RECORD INTERPRETATION    Dates of Recording: 5/8-5/21/2024  Sensor description: dexcom  Results statistics:   CGM use % of time 95  Average and SD 200/84  Time in range 50%  % Time Above 180 50  % Time Below target 0      Glycemic patterns summary: BG's for the most  part have been optimal overnight and fluctuate throughout the day Hyperglycemic episodes  post prandial   Hypoglycemic episodes occurred after bolus/correction  Overnight periods: stable      DIABETIC COMPLICATIONS: Microvascular complications:  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  Social History:  reports that she has never smoked. She has never used smokeless tobacco. She reports current alcohol use. She reports that she does not use drugs. Family History:  Family History  Problem Relation  Age of Onset   Diabetes Father    Heart attack Paternal Grandfather    Heart disease Paternal Grandfather    Diabetes Sister    Hypothyroidism Sister    Cancer Paternal Grandmother        Colon     HOME MEDICATIONS: Allergies as of 03/17/2023       Reactions   Amoxicillin Rash   Did it involve swelling of the face/tongue/throat, SOB, or low BP? n Did it involve sudden or severe rash/hives, skin peeling, or any reaction on the inside of your mouth or nose? n Did you need to seek medical attention at a hospital or doctor's office? n When did it last happen? had reaction as a child If all above answers are "NO", may proceed with cephalosporin use.   Penicillins Rash        Medication List        Accurate as of Mar 17, 2023 11:59 PM. If you have any questions, ask your nurse or doctor.          STOP taking these medications    Ozempic (1 MG/DOSE) 4 MG/3ML Sopn Generic drug: Semaglutide (1 MG/DOSE) Stopped by: Scarlette Shorts, MD   Evaristo Bury FlexTouch 200 UNIT/ML FlexTouch Pen Generic drug: insulin degludec Stopped by: Scarlette Shorts, MD       TAKE these medications    Bard Protective Barrier Film Misc 1 Device by Does not apply route as needed. To be applied before Dexcom Marketing executive Next Monitor w/Device Kit Use to check sugar 3 times daily.   Bayer Microlet Lancets lancets Use as instructed to check sugar 3 times daily.   Dexcom G6 Sensor Misc USE AS DIRECTED (CHANGE  EVERY  10  DAYS)   Dexcom G6 Transmitter Misc USE AS DIRECTED EVERY 90 DAYS   Fiasp 100 UNIT/ML Soln Generic drug: Insulin Aspart (w/Niacinamide) Max daily 100 units   glucagon 1 MG injection Inject 1 mg into the skin once as needed. Follow package directions for low blood sugar.   Insulin Pen Needle 30G X 5 MM Misc 1 Device by Does not apply route in the morning, at noon, in the evening, and at bedtime.   Levonorgestrel-Ethinyl Estradiol 0.15-0.03 &0.01  MG tablet Commonly known as: Ashlyna Take 1 tablet by mouth daily.   levothyroxine 88 MCG tablet Commonly known as: SYNTHROID Take 1 tablet (88 mcg total) by mouth daily.         ALLERGIES: Allergies  Allergen Reactions   Amoxicillin Rash    Did it involve swelling of the face/tongue/throat, SOB,  or low BP? n Did it involve sudden or severe rash/hives, skin peeling, or any reaction on the inside of your mouth or nose? n Did you need to seek medical attention at a hospital or doctor's office? n When did it last happen? had reaction as a child If all above answers are "NO", may proceed with cephalosporin use.   Penicillins Rash     REVIEW OF SYSTEMS: A comprehensive ROS was conducted with the patient and is negative except as per HPI    OBJECTIVE:   VITAL SIGNS: BP 124/70 (BP Location: Left Arm, Patient Position: Sitting, Cuff Size: Large)   Pulse 93   Ht 5\' 7"  (1.702 m)   Wt 207 lb (93.9 kg)   SpO2 97%   BMI 32.42 kg/m    PHYSICAL EXAM:  General: Pt appears well and is in NAD  Neck:  Thyroid: Thyroid size normal.   Lungs: Clear with good BS bilat with no rales, rhonchi, or wheezes  Heart: RRR   Abdomen: soft, nontender  Extremities:  Lower extremities - No pretibial edema.     Neuro: MS is good with appropriate affect, pt is alert and Ox3    DM foot exam: 03/17/2023 The skin of the feet is intact without sores or ulcerations. The pedal pulses are 2+ on right and 2+ on left. The sensation is intact to a screening 5.07, 10 gram monofilament bilaterally   DATA REVIEWED:  Lab Results  Component Value Date   HGBA1C 7.2 (A) 03/17/2023   HGBA1C 7.9 (A) 06/10/2022   HGBA1C 7.6 (A) 10/15/2021    Latest Reference Range & Units 03/17/23 11:59  Sodium 135 - 145 mEq/L 137  Potassium 3.5 - 5.1 mEq/L 4.4  Chloride 96 - 112 mEq/L 104  CO2 19 - 32 mEq/L 23  Glucose 70 - 99 mg/dL 409 (H)  BUN 6 - 23 mg/dL 8  Creatinine 8.11 - 9.14 mg/dL 7.82  Calcium 8.4 - 95.6  mg/dL 8.6  GFR >21.30 mL/min 117.73    Latest Reference Range & Units 03/17/23 11:59  Total CHOL/HDL Ratio  4  Cholesterol 0 - 200 mg/dL 865 (H)  HDL Cholesterol >39.00 mg/dL 78.46  LDL (calc) 0 - 99 mg/dL 962 (H)  MICROALB/CREAT RATIO 0.0 - 30.0 mg/g 0.4  NonHDL  162.01  Triglycerides 0.0 - 149.0 mg/dL 952.8  VLDL 0.0 - 41.3 mg/dL 24.4    Latest Reference Range & Units 03/17/23 11:59  Glucose 70 - 99 mg/dL 010 (H)    Latest Reference Range & Units 03/17/23 11:59  Creatinine,U mg/dL 272.5  Microalb, Ur 0.0 - 1.9 mg/dL <3.6  MICROALB/CREAT RATIO 0.0 - 30.0 mg/g 0.4    ASSESSMENT / PLAN / RECOMMENDATIONS:   1) Type 1 Diabetes Mellitus, Sub-optimally controlled, With retinopathic complications - Most recent A1c of 7.2 %. Goal A1c < 7.0 %.    -A1c has trended down while on pump therapy -She was on Ozempic, insurance stopped paying for Ozempic as she has type I DM, the patient does not wish to go on any GLP-1 agonist due to nausea while on Ozempic -We discussed the importance of entering carbohydrate consumed at the beginning of the meal if possible, she does not count carbohydrates, but she enters a set amount of carbohydrates with meals and snacks -I will increase her suppertime prandial dose as below -BMP, MA/CR ratio normal   Pump   Tandem Settings   Insulin type   Fiasp   Basal rate  0000 1.0u/h    0900 1.350          I:C ratio       0000 1:1    14/16/18 meals    6 g with snacks           Sensitivity       0000  35      Goal       0000  110         MEDICATIONS: -Fiasp   EDUCATION / INSTRUCTIONS: BG monitoring instructions: Patient is instructed to check her blood sugars 3 times a day, before meals  Call [facility] if: BG persistently < 70  I reviewed the Rule of 15 for the treatment of hypoglycemia in detail with the patient. Literature supplied.   2) Diabetic complications:  Eye: Does  have known diabetic retinopathy.  Neuro/  Feet: Does not have known diabetic peripheral neuropathy. Renal: Patient does not have known baseline CKD. She is not on an ACEI/ARB at present.  3) Hashimoto's thyroiditis:   -TSH is normal -No changes at this time  Medication Continue levothyroxine 88 mcg daily    4. Dyslipidemia:   -LDL has trended up -Will encourage lifestyle changes   5.  Weight gain:  -This has been mostly attributed to being on insulin pump, another factor is the lack of GLP-1 agonist intake -I did offer to refer her to [facility] weight management clinic, but she would like to work on this for now -Discussed the importance of avoiding snacks when possible, I have also asked her to limit carbohydrate intake per meal 45-60 -Encouraged to continue exercise   F/U in 4 months   Signed electronically by: Lyndle Herrlich, MD  [facility]  [facility] [address] [place] [place], [place] [address] Phone: [phone] FAX: [phone]   CC: Donato Schultz, DO [address] [place] [address] Phone: [phone]  Fax: [phone]    Return to Endocrinology clinic as below: Future Appointments  Date Time Provider Department Center  06/11/2023  1:00 PM Donato Schultz, DO [facility]  09/23/2023 10:30 AM Jaymarion Trombly, Konrad Dolores, MD [facility] None

## 2023-03-17 ENCOUNTER — Ambulatory Visit: Payer: BC Managed Care – PPO | Admitting: Internal Medicine

## 2023-03-17 ENCOUNTER — Encounter: Payer: Self-pay | Admitting: Internal Medicine

## 2023-03-17 ENCOUNTER — Other Ambulatory Visit (INDEPENDENT_AMBULATORY_CARE_PROVIDER_SITE_OTHER): Payer: BC Managed Care – PPO

## 2023-03-17 VITALS — BP 124/70 | HR 93 | Ht 67.0 in | Wt 207.0 lb

## 2023-03-17 DIAGNOSIS — E785 Hyperlipidemia, unspecified: Secondary | ICD-10-CM | POA: Diagnosis not present

## 2023-03-17 DIAGNOSIS — E109 Type 1 diabetes mellitus without complications: Secondary | ICD-10-CM

## 2023-03-17 DIAGNOSIS — E063 Autoimmune thyroiditis: Secondary | ICD-10-CM | POA: Diagnosis not present

## 2023-03-17 LAB — MICROALBUMIN / CREATININE URINE RATIO
Creatinine,U: 158.2 mg/dL
Microalb Creat Ratio: 0.4 mg/g (ref 0.0–30.0)
Microalb, Ur: <0.7 mg/dL (ref 0.0–1.9)

## 2023-03-17 LAB — LIPID PANEL
Cholesterol: 220 mg/dL — ABNORMAL HIGH (ref 0–200)
HDL: 57.7 mg/dL (ref >39.00)
LDL Cholesterol: 133 mg/dL — ABNORMAL HIGH (ref 0–99)
NonHDL: 162.01
Total CHOL/HDL Ratio: 4
Triglycerides: 143 mg/dL (ref 0.0–149.0)
VLDL: 28.6 mg/dL (ref 0.0–40.0)

## 2023-03-17 LAB — TSH: TSH: 3.15 u[IU]/mL (ref 0.35–5.50)

## 2023-03-17 LAB — POCT GLYCOSYLATED HEMOGLOBIN (HGB A1C): Hemoglobin A1C: 7.2 % — AB (ref 4.0–5.6)

## 2023-03-17 LAB — BASIC METABOLIC PANEL
BUN: 8 mg/dL (ref 6–23)
CO2: 23 mEq/L (ref 19–32)
Calcium: 8.6 mg/dL (ref 8.4–10.5)
Chloride: 104 mEq/L (ref 96–112)
Creatinine, Ser: 0.63 mg/dL (ref 0.40–1.20)
GFR: 117.73 mL/min (ref >60.00)
Glucose, Bld: 126 mg/dL — ABNORMAL HIGH (ref 70–99)
Potassium: 4.4 mEq/L (ref 3.5–5.1)
Sodium: 137 mEq/L (ref 135–145)

## 2023-03-17 LAB — VITAMIN D 25 HYDROXY (VIT D DEFICIENCY, FRACTURES): VITD: 31.4 ng/mL (ref 30.00–100.00)

## 2023-03-17 NOTE — Patient Instructions (Signed)

## 2023-03-18 ENCOUNTER — Encounter: Payer: Self-pay | Admitting: Internal Medicine

## 2023-03-18 MED ORDER — FIASP 100 UNIT/ML IJ SOLN: INTRAMUSCULAR | 3 refills | Status: DC

## 2023-03-18 MED ORDER — LEVOTHYROXINE SODIUM 88 MCG PO TABS: 88.0000 ug | ORAL_TABLET | Freq: Every day | ORAL | 3 refills | Status: DC

## 2023-03-19 ENCOUNTER — Encounter: Payer: Self-pay | Admitting: Internal Medicine

## 2023-06-11 ENCOUNTER — Encounter: Payer: BC Managed Care – PPO | Admitting: Family Medicine

## 2023-07-27 ENCOUNTER — Other Ambulatory Visit: Payer: Self-pay

## 2023-07-27 ENCOUNTER — Telehealth: Payer: Self-pay | Admitting: Internal Medicine

## 2023-07-27 MED ORDER — FIASP 100 UNIT/ML IJ SOLN: INTRAMUSCULAR | 0 refills | Status: DC

## 2023-07-27 MED ORDER — DEXCOM G6 SENSOR MISC: 0 refills | Status: DC
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
## 2023-07-27 NOTE — Telephone Encounter (Signed)
Prescription(s) have been sent

## 2023-07-28 MED ORDER — INSULIN ASPART 100 UNIT/ML IJ SOLN: INTRAMUSCULAR | 2 refills | Status: DC

## 2023-07-28 NOTE — Addendum Note (Signed)
Addended by: Lisabeth Pick on: 07/28/2023 11:45 AM   Modules accepted: Orders

## 2023-07-28 NOTE — Telephone Encounter (Signed)
Patient is calling to say that Jordan Hawks has told her that Fiasp Insulin Aspart, w/Niacinamide, (FIASP) 100 UNIT/ML SOLN Is on back order until the end of the month and she will be out of the medication soon.  Patient is asking if she can get a prescription for a different type of insulin until the end of the month.  Patient states that her father will be picking up the medication here in [place] and taking it to her tomorrow.

## 2023-07-28 NOTE — Telephone Encounter (Signed)
Patient aware.

## 2023-07-28 NOTE — Addendum Note (Signed)
Addended by: Scarlette Shorts on: 07/28/2023 11:03 AM   Modules accepted: Orders

## 2023-09-18 ENCOUNTER — Telehealth: Payer: Self-pay | Admitting: Internal Medicine

## 2023-09-18 MED ORDER — DEXCOM G6 TRANSMITTER MISC: 3 refills | Status: DC

## 2023-09-18 MED ORDER — CONTOUR NEXT TEST VI STRP: ORAL_STRIP | 12 refills | Status: AC

## 2023-09-18 NOTE — Telephone Encounter (Signed)
MEDICATION: 1) Dexcom G6 Transmitter Continuous Blood Gluc Transmit (DEXCOM G6 TRANSMITTER) MISC  2) Test Strips for the [facility] NEXT MONITOR)   PHARMACY:  [facility] [address] [place], [place] [address] [phone]   HAS THE PATIENT CONTACTED THEIR PHARMACY?  No  IS THIS A 90 DAY SUPPLY : Yes  IS PATIENT OUT OF MEDICATION: Yes  IF NOT; HOW MUCH IS LEFT:   LAST APPOINTMENT DATE: @5 /21/24  NEXT APPOINTMENT DATE:@11 /27/2024  DO WE HAVE YOUR PERMISSION TO LEAVE A DETAILED MESSAGE?: Yes  OTHER COMMENTS: Patient is using a different pharmacy   **Let patient know to contact pharmacy at the end of the day to make sure medication is ready. **  ** Please notify patient to allow 48-72 hours to process**  **Encourage patient to contact the pharmacy for refills or they can request refills through [facility]**

## 2023-09-22 NOTE — Progress Notes (Unsigned)
Name: Traci Rivas  MRN/ DOB: [id number], 1991-06-25   Age/ Sex: 32 y.o., female    PCP: Traci Rivas, Traci Rivas   Reason for Endocrinology Evaluation: Type 1 Diabetes Mellitus/ Hypothyroidism     Date of Initial Endocrinology Visit: 09/23/2023     PATIENT IDENTIFIER: Traci Rivas is a 32 y.o. female with a past medical history of T1DM and Hypothyroidism. The patient presented for initial endocrinology clinic visit on 09/23/2023 for consultative assistance with her diabetes management.     DIABETIC HISTORY:  Traci Rivas was diagnosed with T1DM in 2010 at age 54, has been on insulin since her diagnosis. Her hemoglobin A1c has ranged from 9.0% in 2016, peaking at 10.6% in 2019.   Metformin showed  no difference in glycemic control by taking it once a day     Transitioned care from Traci Rivas by 03/05/2021  Sister with T1 DM  Trulicity started 02/2021 she eventually transition to Ozempic but developed GI side effects and insurance stopped covering it due to her diagnosis of T1DM 02/2023   She was trained on insulin pump 10/2022, but the patient opted to discontinue pump use by November 2024   HASHIMOTO'S THYROIDITIS:  Has been on LT-4 replacement for years. Has long history of non-compliance.   Mother with Thyroid disease   SUBJECTIVE:   During the last visit (03/17/2023): A1c 7.2%    Today (09/23/23): Traci Rivas is here for a follow up on diabetes and hypothyroidism.   She checks her sugars blood sugars multiple  times daily, through CGM.The patient has not had hypoglycemic episodes since the last clinic visit.  She has been noted with weight gain Would like to stay off insulin pump, and would like to switch to MDI regimen  Denies nausea or vomiting  Denies constipation or diarrhea   This patient with type 1 diabetes is treated with tandem (insulin pump). During the visit the pump basal and bolus doses were reviewed including carb/insulin rations and  supplemental doses. The clinical list was updated. The glucose meter download was reviewed in detail to determine if the current pump settings are providing the best glycemic control without excessive hypoglycemia.  Pump and meter download:      Pump   Tandem Settings   Insulin type   Novolog    Basal rate       0000 1.215u/h    0900 1.350          I:C ratio       0000 1:1    12-14 with Breakfast 16 with lunch  18 with supper               Sensitivity       0000  35      Goal       0000  110             Body mass index is 33.2 kg/m.  PUMP STATISTICS: Average BG: 183 Average Daily Carbs (g): 58 Average Total Daily Insulin: 96.59 Average Daily Basal: 35.06 (36 %) Average Daily Bolus: 61.53 (64 %)     HOME ENDOCRINE  REGIMEN: Novolog  Levothyroxine 88 mcg daily      Statin: no ACE-I/ARB: no Prior Diabetic Education: yes   CONTINUOUS GLUCOSE MONITORING RECORD INTERPRETATION    Dates of Recording: 10/31-11/27/2024  Sensor description: Traci  Results statistics:   CGM use % of time 47  Average and SD 183/62  Time in range 30%  %  Time Above 180 55  % Time Below target 0      Glycemic patterns summary: BG's trend down overnight and increased throughout the day Hyperglycemic episodes  post prandial   Hypoglycemic episodes occurred N/A  Overnight periods: Start high but trends down     DIABETIC COMPLICATIONS: Microvascular complications:  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  Social History:  reports that she has  never smoked. She has never used smokeless tobacco. She reports current alcohol use. She reports that she does not use drugs. Family History:  Family History  Problem Relation Age of Onset   Diabetes Father    Heart attack Paternal Grandfather    Heart disease Paternal Grandfather    Diabetes Sister    Hypothyroidism Sister    Cancer Paternal Grandmother        Colon     HOME MEDICATIONS: Allergies as of 09/23/2023       Reactions   Amoxicillin Rash   Did it involve swelling of the face/tongue/throat, SOB, or low BP? n Did it involve sudden or severe rash/hives, skin peeling, or any reaction on the inside of your mouth or nose? n Did you need to seek medical attention at a hospital or doctor's office? n When did it last happen? had reaction as a child If all above answers are "NO", may proceed with cephalosporin use.   Penicillins Rash        Medication List        Accurate as of September 23, 2023 10:20 AM. If you have any questions, ask your nurse or doctor.          Traci Rivas Misc 1 Device by Does not apply route as needed. To be applied before Traci Rivas Next Monitor w/Device Kit Use to check sugar 3 times daily.   Bayer Microlet Lancets lancets Use as instructed to check sugar 3 times daily.   Contour Next Test test strip Generic drug: glucose blood Check blood sugar 3x daily   Traci G6 Sensor Misc USE AS DIRECTED (CHANGE  EVERY  10  DAYS)   Traci G6 Transmitter Misc USE AS DIRECTED EVERY 90 DAYS   Fiasp 100 UNIT/ML Soln Generic drug: Insulin Aspart (w/Niacinamide) Max daily 150 units   glucagon 1 MG injection Inject 1 mg into the skin once as needed. Follow package directions for low blood sugar.   insulin aspart 100 UNIT/ML injection Commonly known as: NovoLOG Max daily 150 units   Insulin Pen Needle 30G X 5 MM Misc 1 Device by Does not apply route in the morning, at noon, in the evening, and at  bedtime.   Levonorgestrel-Ethinyl Estradiol 0.15-0.03 &0.01 MG tablet Commonly known as: Traci Rivas Take 1 tablet by mouth daily.   levothyroxine 88 MCG tablet Commonly known as: SYNTHROID Take 1 tablet (88 mcg total) by mouth daily.         ALLERGIES: Allergies  Allergen Reactions   Amoxicillin Rash    Did it involve swelling of the face/tongue/throat, SOB, or low BP? n  Did it involve sudden or severe rash/hives, skin peeling, or any reaction on the inside of your mouth or nose? n Did you need to seek medical attention at a hospital or doctor's office? n When did it last happen? had reaction as a child If all above answers are "NO", may proceed with cephalosporin use.   Penicillins Rash     REVIEW OF SYSTEMS: A comprehensive ROS was conducted with the patient and is negative except as per HPI    OBJECTIVE:   VITAL SIGNS: BP 124/74 (BP Location: Left Arm, Patient Position: Sitting, Cuff Size: Large)   Pulse 88   Ht 5\' 7"  (1.702 m)   Wt 212 lb (96.2 kg)   SpO2 96%   BMI 33.20 kg/m    PHYSICAL EXAM:  General: Pt appears well and is in NAD  Neck:  Thyroid: Thyroid size normal.   Lungs: Clear with good BS bilat   Heart: RRR   Abdomen: soft, nontender  Extremities:  Lower extremities - No pretibial edema.     Neuro: MS is good with appropriate affect, pt is alert and Ox3    DM foot exam: 03/17/2023 The skin of the feet is intact without sores or ulcerations. The pedal pulses are 2+ on right and 2+ on left. The sensation is intact to a screening 5.07, 10 gram monofilament bilaterally   DATA REVIEWED:  Lab Results  Component Value Date   HGBA1C 8.7 (A) 09/23/2023   HGBA1C 7.2 (A) 03/17/2023   HGBA1C 7.9 (A) 06/10/2022    Latest Reference Range & Units 03/17/23 11:59  Sodium 135 - 145 mEq/L 137  Potassium 3.5 - 5.1 mEq/L 4.4  Chloride 96 - 112 mEq/L 104  CO2 19 - 32 mEq/L 23  Glucose 70 - 99 mg/dL 284 (H)  BUN 6 - 23 mg/dL 8  Creatinine 1.32 - 4.40 mg/dL  1.02  Calcium 8.4 - 72.5 mg/dL 8.6  GFR >36.64 mL/min 117.73    Latest Reference Range & Units 03/17/23 11:59  Total CHOL/HDL Ratio  4  Cholesterol 0 - 200 mg/dL 403 (H)  HDL Cholesterol >39.00 mg/dL 47.42  LDL (calc) 0 - 99 mg/dL 595 (H)  MICROALB/CREAT RATIO 0.0 - 30.0 mg/g 0.4  NonHDL  162.01  Triglycerides 0.0 - 149.0 mg/dL 638.7  VLDL 0.0 - 56.4 mg/dL 33.2    Latest Reference Range & Units 03/17/23 11:59  Glucose 70 - 99 mg/dL 951 (H)    Latest Reference Range & Units 03/17/23 11:59  Creatinine,U mg/dL 884.1  Microalb, Ur 0.0 - 1.9 mg/dL <6.6  MICROALB/CREAT RATIO 0.0 - 30.0 mg/g 0.4    ASSESSMENT / PLAN / RECOMMENDATIONS:   1) Type 1 Diabetes Mellitus, Poorly Controlled, With retinopathic complications - Most recent A1c of 8.7 %. Goal A1c < 7.0 %.    -Patient has been noted worsening glycemic control, A1c increased from 7.2% to 8.7% -Patient developed nausea while on Ozempic -She is interested in metformin, caution against GI side effects -Patient would like to discontinue insulin pump technology and restart on multiple daily injections of insulin     MEDICATIONS: -Start metformin 500 mg XR, 1 tablet with breakfast for 1 week, then increase to 1 tablet with breakfast and 1 tablet with supper -Start Tresiba 40 units daily -Take NovoLog 14 units with breakfast, 16 units with lunch, 18 units with supper -Start CF: NovoLog (BG -120/35) TIDQAC   EDUCATION / INSTRUCTIONS: BG monitoring instructions: Patient is instructed to check her blood sugars 3 times  a day, before meals  Call [facility] if: BG persistently < 70  I reviewed the Rule of 15 for the treatment of hypoglycemia in detail with the patient. Literature supplied.   2) Diabetic complications:  Eye: Does  have known diabetic retinopathy.  Neuro/ Feet: Does not have known diabetic peripheral neuropathy. Renal: Patient does not have known baseline CKD. She is not on an ACEI/ARB at  present.  3) Hashimoto's thyroiditis:   -TSH has been normal in the past -No changes at this time  Medication Continue levothyroxine 88 mcg daily    4.  Weight gain :  [employer] has not been covering for any GLP-1 agonist -I have emphasized the importance of following a specific diet, I have encouraged her to look into weight watchers or Noom -Patient is motivated to continue with exercise and and weight lifting -I have advised the patient that if she does not see any outcome within the next 4 months or being on a strict diet and exercise, she may follow-up with the [facility], as it is low to her     F/U in 3 months   Signed electronically by: Lyndle Herrlich, MD  [facility]  [facility] [address] [place] [place], [place] [address] Phone: [phone] FAX: [phone]   CC: Donato Schultz, Rivas [address] [place] [address] Phone: [phone]  Fax: [phone]    Return to Endocrinology clinic as below: Future Appointments  Date Time Provider Department Center  09/23/2023 10:30 AM Kyaire Gruenewald, Konrad Dolores, MD [facility] None

## 2023-09-23 ENCOUNTER — Encounter: Payer: Self-pay | Admitting: Internal Medicine

## 2023-09-23 ENCOUNTER — Ambulatory Visit: Payer: BC Managed Care – PPO | Admitting: Internal Medicine

## 2023-09-23 VITALS — BP 124/74 | HR 88 | Ht 67.0 in | Wt 212.0 lb

## 2023-09-23 DIAGNOSIS — E109 Type 1 diabetes mellitus without complications: Secondary | ICD-10-CM | POA: Diagnosis not present

## 2023-09-23 LAB — POCT GLYCOSYLATED HEMOGLOBIN (HGB A1C): Hemoglobin A1C: 8.7 % — AB (ref 4.0–5.6)

## 2023-09-23 MED ORDER — TRESIBA FLEXTOUCH 100 UNIT/ML ~~LOC~~ SOPN: 40.0000 [IU] | PEN_INJECTOR | Freq: Every day | SUBCUTANEOUS | 3 refills | Status: AC

## 2023-09-23 MED ORDER — DEXCOM G7 SENSOR MISC: 1.0000 | 3 refills | Status: DC

## 2023-09-23 MED ORDER — METFORMIN HCL ER 500 MG PO TB24: 500.0000 mg | ORAL_TABLET | Freq: Two times a day (BID) | ORAL | 3 refills | Status: AC

## 2023-09-23 MED ORDER — NOVOLOG FLEXPEN 100 UNIT/ML ~~LOC~~ SOPN: PEN_INJECTOR | SUBCUTANEOUS | 3 refills | Status: AC

## 2023-09-23 MED ORDER — INSULIN PEN NEEDLE 30G X 5 MM MISC: 1.0000 | Freq: Four times a day (QID) | 3 refills | Status: AC

## 2023-09-23 NOTE — Patient Instructions (Addendum)
Start Metformin 1 tablet daily with Breakfast for 1 week, then increase to 1 tablet with Breakfast and 1 tablet with Supper  Start Tresiba 40 units ONCE daily  Take Novolog 14 units with Breakfast, 16 units with lunch and 18 units with supper Novolog correctional insulin: ADD extra units on insulin to your meal-time Novolog dose if your blood sugars are higher than 155. Use the scale below to help guide you:   Blood sugar before meal Number of units to inject  Less than 155 0 unit  156 -  190 1 units  191 -  225 2 units  226 -  260 3 units  261 -  295 4 units  296 -  330 5 units  331 -  365 6 units  366 -  400 7 units       - HOW TO TREAT LOW BLOOD SUGARS (Blood sugar LESS THAN 70 MG/DL) Please follow the RULE OF 15 for the treatment of hypoglycemia treatment (when your (blood sugars are less than 70 mg/dL)   STEP 1: Take 15 grams of carbohydrates when your blood sugar is low, which includes:  3-4 GLUCOSE TABS  OR 3-4 OZ OF JUICE OR REGULAR SODA OR ONE TUBE OF GLUCOSE GEL    STEP 2: RECHECK blood sugar in 15 MINUTES STEP 3: If your blood sugar is still low at the 15 minute recheck --> then, go back to STEP 1 and treat AGAIN with another 15 grams of carbohydrates.

## 2023-10-01 ENCOUNTER — Other Ambulatory Visit: Payer: Self-pay

## 2023-10-01 DIAGNOSIS — Z01419 Encounter for gynecological examination (general) (routine) without abnormal findings: Secondary | ICD-10-CM

## 2023-10-01 MED ORDER — LEVONORGEST-ETH ESTRAD 91-DAY 0.15-0.03 &0.01 MG PO TABS: 1.0000 | ORAL_TABLET | Freq: Every day | ORAL | 3 refills | Status: AC

## 2023-10-01 MED ORDER — LEVOTHYROXINE SODIUM 88 MCG PO TABS: 88.0000 ug | ORAL_TABLET | Freq: Every day | ORAL | 3 refills | Status: DC

## 2023-10-22 ENCOUNTER — Other Ambulatory Visit: Payer: Self-pay | Admitting: Internal Medicine

## 2023-10-22 NOTE — Telephone Encounter (Signed)
[employer]  refill request complete,

## 2023-12-08 ENCOUNTER — Ambulatory Visit: Payer: Self-pay | Admitting: Family Medicine

## 2023-12-08 NOTE — Telephone Encounter (Signed)
Pt advised to go to ED

## 2023-12-08 NOTE — Telephone Encounter (Signed)
Chief Complaint: Vomiting Symptoms: Nausea/vomiting, diarrhea, dark stools, abdominal pain, hyperglycemia, dehydration. Frequency: Off and on Pertinent Negatives: Patient denies fever, fruity breath, blood with wiping Disposition: [x] ED /[] Urgent Care (no appt availability in office) / [] Appointment(In office/virtual)/ []  [facility]/ [] Home Care/ [] Refused Recommended Disposition /[] [facility]/ []  Follow-up with PCP Additional Notes: Patient contacted for complaints of n/v and diarrhea. Patient states that she was with her nephew who had symptoms of n/v and diarrhea and on Friday he was sent to the ER where he subsequently tested positive for Influenza A and an ear infection. Patient was also with mom and sister who Sunday began to experience diarrhea and nausea. Patient states that her symptoms began shortly after and has vomited more than 20x yesterday and a few times today but hasn't vomited since about noon. Patient also states that she was having several bouts of diarrhea simultaneously and has had about 4 episodes of diarrhea today. Patient states that she was dehydrated yesterday and did not have urine output due to vomiting up and having diarrhea with everything she ate or drank. Patient states she is also a Type 1 diabetic and manages with insulin. Patient denies any new or strange foods that could possibly lead to food poisoning, current fevers, blood with wiping, or hypoglycemia. Patient states that the vomit ranges from clear to yellow to blue depending on what Gatorade/fluid she drank, no blood present with emesis. Patient has not eaten until today where she had mac n cheese but unable to tolerate more than a few bites. Patient states that her diarrhea is black in color, darkened over time from brown. Patient states her BS is 300 but she gave herself insulin to bring it down and states she was able to drink one Gatorade bottle without vomiting so far. Patient treating  herself with Pepto bismol tablets with some effectiveness, however she is still nauseated but not as severe as yesterday. Some abdominal pain present. Patient advised by this RN to go to the ER per protocol, to which the patient was agreeable.   Reason for Disposition  [1] SEVERE vomiting (e.g., 6 or more times/day) AND [2] present > 8 hours (Exception: Patient sounds well, is drinking liquids, does not sound dehydrated, and vomiting has lasted less than 24 hours.)  Answer Assessment - Initial Assessment Questions 1. VOMITING SEVERITY: "How many times have you vomited in the past 24 hours?"     - MILD:  1 - 2 times/day    - MODERATE: 3 - 5 times/day, decreased oral intake without significant weight loss or symptoms of dehydration    - SEVERE: 6 or more times/day, vomits everything or nearly everything, with significant weight loss, symptoms of dehydration      Over 20, last night midnight.  2. ONSET: "When did the vomiting begin?"      Last night 3. FLUIDS: "What fluids or food have you vomited up today?" "Have you been able to keep any fluids down?"     "I was able to drink a full Gatorade bottle today. And I just ate like three bites of mac N cheese." 4. ABDOMEN PAIN: "Are your having any abdomen pain?" If Yes : "How bad is it and what does it feel like?" (e.g., crampy, dull, intermittent, constant)      "It's feeling a lot better today, I'm still feeling nauseous off and on throughout the day  5. DIARRHEA: "Is there any diarrhea?" If Yes, ask: "How many times today?"  Four,  6. CONTACTS: "Is there anyone else in the family with the same symptoms?"      "My mom and my sister." 7. CAUSE: "What do you think is causing your vomiting?"     Unsure. 8. HYDRATION STATUS: "Any signs of dehydration?" (e.g., dry mouth [not only dry lips], too weak to stand) "When did you last urinate?"     "I feel pretty dehydrated, but I have been able to keep most of the fluids down today." 9. OTHER SYMPTOMS:  "Do you have any other symptoms?" (e.g., fever, headache, vertigo, vomiting blood or coffee grounds, recent head injury)     "No headaches, I got dizzy yesterday  10. PREGNANCY: "Is there any chance you are pregnant?" "When was your last menstrual period?"       Denies  Protocols used: Vomiting-A-AH

## 2023-12-17 ENCOUNTER — Telehealth: Payer: Self-pay | Admitting: *Deleted

## 2023-12-17 NOTE — Telephone Encounter (Signed)
 Patient was identified as falling into the [place] Measure - Diabetes.   Patient was: Appointment scheduled for lab or office visit for A1c.

## 2024-01-04 ENCOUNTER — Ambulatory Visit: Payer: BC Managed Care – PPO | Admitting: Internal Medicine

## 2024-01-11 LAB — HM DIABETES EYE EXAM

## 2024-03-18 ENCOUNTER — Ambulatory Visit: Admitting: Internal Medicine

## 2024-03-18 ENCOUNTER — Encounter: Payer: Self-pay | Admitting: Internal Medicine

## 2024-03-18 VITALS — BP 124/76 | HR 92 | Ht 67.0 in | Wt 205.0 lb

## 2024-03-18 DIAGNOSIS — E109 Type 1 diabetes mellitus without complications: Secondary | ICD-10-CM

## 2024-03-18 DIAGNOSIS — E785 Hyperlipidemia, unspecified: Secondary | ICD-10-CM

## 2024-03-18 DIAGNOSIS — E063 Autoimmune thyroiditis: Secondary | ICD-10-CM | POA: Diagnosis not present

## 2024-03-18 LAB — POCT GLYCOSYLATED HEMOGLOBIN (HGB A1C): Hemoglobin A1C: 8.7 % — AB (ref 4.0–5.6)

## 2024-03-18 NOTE — Progress Notes (Unsigned)
 Name: Traci Rivas  MRN/ DOB: [id number], 17-Apr-1991   Age/ Sex: 33 y.o., female    PCP: Crecencio Dodge, Candida Chalk, DO   Reason for Endocrinology Evaluation: Type 1 Diabetes Mellitus/ Hypothyroidism     Date of Initial Endocrinology Visit: 03/05/2021    PATIENT IDENTIFIER: Traci Rivas is a 33 y.o. female with a past medical history of T1DM and Hypothyroidism. The patient presented for initial endocrinology clinic visit on 03/05/2021 for consultative assistance with her diabetes management.     DIABETIC HISTORY:  Traci Rivas was diagnosed with T1DM in 2010 at age 59, has been on insulin  since her diagnosis. Her hemoglobin A1c has ranged from 9.0% in 2016, peaking at 10.6% in 2019.   Metformin  showed  no difference in glycemic control by taking it once a day     Transitioned care from Traci Rivas by 03/05/2021  Sister with T1 DM  Trulicity  started 02/2021 she eventually transition to Ozempic  but developed GI side effects and insurance stopped covering it due to her diagnosis of T1DM 02/2023   She was trained on insulin  pump 10/2022, but the patient opted to discontinue pump use by November 2024   Started metformin  08/2023  HASHIMOTO'S THYROIDITIS:  Has been on LT-4 replacement for years. Has long history of non-compliance.   Mother with Thyroid  disease   SUBJECTIVE:   During the last visit (09/23/2023): A1c 8.7 %    Today (03/18/24): Traci Rivas is here for a follow up on diabetes and hypothyroidism.   She checks her sugars blood sugars multiple  times daily, through CGM.The patient has not had hypoglycemic episodes since the last clinic visit.  Patient has been noted weight loss [place] Denies nausea or vomiting  Denies constipation or diarrhea  She is recovering from cold symptoms   HOME ENDOCRINE  REGIMEN: Metformin  500 mg XR, 1 tablet  Tresiba  40 units daily NovoLog  14/16/18 units CF: NovoLog (BG -120/35) TIDQAC Levothyroxine  88 mcg daily      Statin: no ACE-I/ARB: no Prior Diabetic Education: yes   CONTINUOUS GLUCOSE MONITORING RECORD INTERPRETATION    Dates of Recording: 5/10-5/23/2025  Sensor description: dexcom  Results statistics:   CGM use % of time 93  Average and SD 235/82  Time in range 30%  % Time Above 180 29  % Time above 250 41  % Time Below target 0      Glycemic patterns summary: BG's trend down overnight and increased throughout the day Hyperglycemic episodes  post prandial   Hypoglycemic episodes occurred N/A  Overnight periods: Start high but trends down   DIABETIC COMPLICATIONS: Microvascular complications:  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  Social History:  reports that she has never smoked. She has never used smokeless tobacco. She reports current alcohol use. She reports that she does not use drugs. Family History:  Family History  Problem Relation Age of Onset   Diabetes Father    Heart attack Paternal Grandfather    Heart disease Paternal Grandfather    Diabetes Sister  Hypothyroidism Sister    Cancer Paternal Grandmother        Colon     HOME MEDICATIONS: Allergies as of 03/18/2024       Reactions   Amoxicillin Rash   Did it involve swelling of the face/tongue/throat, SOB, or low BP? n Did it involve sudden or severe rash/hives, skin peeling, or any reaction on the inside of your mouth or nose? n Did you need to seek medical attention at a hospital or doctor's office? n When did it last happen? had reaction as a child If all above answers  are "NO", may proceed with cephalosporin use.   Penicillins Rash        Medication List        Accurate as of Mar 18, 2024 10:20 AM. If you have any questions, ask your nurse or doctor.          Bard Protective Barrier Film Misc 1 Device by Does not apply route as needed. To be applied before Dex[employer] executive Next Monitor w/Device Kit Use to check sugar 3 times daily.   Bayer Microlet Lancets lancets Use as instructed to check sugar 3 times daily.   Contour Next Test test strip Generic drug: glucose blood Check blood sugar 3x daily   Dexcom G6 Sensor Misc USE AS DIRECTED (CHANGE  EVERY  10  DAYS)   glucagon  1 MG injection Inject 1 mg into the skin once as needed. Follow package directions for low blood sugar.   Insulin  Pen Needle 30G X 5 MM Misc 1 Device by Does not apply route in the morning, at noon, in the evening, and at bedtime.   B-D UF III MINI PEN NEEDLES 31G X 5 MM Misc Generic drug: Insulin  Pen Needle Inject into the skin 2 (two) times daily.   Levonorgestrel-Ethinyl Estradiol 0.15-0.03 &0.01 MG tablet Commonly known as: Ashlyna Take 1 tablet by mouth daily.   levothyroxine  88 MCG tablet Commonly known as: SYNTHROID  Take 1 tablet (88 mcg total) by mouth daily.   metFORMIN  500 MG 24 hr tablet Commonly known as: GLUCOPHAGE -XR Take 1 tablet (500 mg total) by mouth 2 (two) times daily with a meal. What changed: when to take this   NovoLOG  FlexPen 100 UNIT/ML FlexPen Generic drug: insulin  aspart Mx daily 75 units   Tresiba  FlexTouch 100 UNIT/ML FlexTouch Pen Generic drug: insulin  degludec Inject 40 Units into the skin daily.         ALLERGIES: Allergies  Allergen Reactions   Amoxicillin Rash    Did it involve swelling of the face/tongue/throat, SOB, or low BP? n Did it involve sudden or severe rash/hives, skin peeling, or any reaction on the inside of your mouth or nose? n Did you need to seek medical attention at a  hospital or doctor's office? n When did it last happen? had reaction as a child If all above answers are "NO", may proceed with cephalosporin use.   Penicillins Rash     REVIEW OF SYSTEMS: A comprehensive ROS was conducted with the patient and is negative except as per HPI    OBJECTIVE:   VITAL SIGNS: BP 124/76 (BP Location: Left Arm, Patient Position: Sitting, Cuff Size: Normal)   Pulse 92   Ht 5\' 7"  (1.702 m)   Wt 205 lb (93 kg)   SpO2 98%   BMI 32.11 kg/m    PHYSICAL EXAM:  General: Pt appears well and is in NAD  Neck:  Thyroid : Thyroid  size normal.  Lungs: Clear with good BS bilat   Heart: RRR   Abdomen: soft, nontender  Extremities:  Lower extremities - No pretibial edema.     Neuro: MS is good with appropriate affect, pt is alert and Ox3    DM foot exam: 03/18/2024 The skin of the feet is intact without sores or ulcerations. The pedal pulses are 2+ on right and 2+ on left. The sensation is intact to a screening 5.07, 10 gram monofilament bilaterally   DATA REVIEWED:  Lab Results  Component Value Date   HGBA1C 8.7 (A) 09/23/2023   HGBA1C 7.2 (A) 03/17/2023   HGBA1C 7.9 (A) 06/10/2022     Latest Reference Range & Units 03/18/24 10:43  Sodium 135 - 146 mmol/L 136  Potassium 3.5 - 5.3 mmol/L 4.5  Chloride 98 - 110 mmol/L 102  CO2 20 - 32 mmol/L 25  Glucose 65 - 99 mg/dL 478 (H)  BUN 7 - 25 mg/dL 10  Creatinine 2.95 - 6.21 mg/dL 3.08  Calcium 8.6 - 65.7 mg/dL 9.2  BUN/Creatinine Ratio 6 - 22 (calc) SEE NOTE:  eGFR > OR = 60 mL/min/1.50m2 119    Latest Reference Range & Units 03/18/24 10:43  Total CHOL/HDL Ratio <5.0 (calc) 5.1 (H)  Cholesterol <200 mg/dL 846 (H)  HDL Cholesterol > OR = 50 mg/dL 48 (L)  LDL Cholesterol (Calc) mg/dL (calc) 962 (H)  MICROALB/CREAT RATIO <30 mg/g creat 8  Non-HDL Cholesterol (Calc) <130 mg/dL (calc) 952 (H)  Triglycerides <150 mg/dL 841 (H)    Latest Reference Range & Units 03/18/24 10:43  TSH mIU/L 6.67 (H)     Latest Reference Range & Units 03/18/24 10:43  Microalb, Ur mg/dL 1.2  MICROALB/CREAT RATIO <30 mg/g creat 8  Creatinine, Urine 20 - 275 mg/dL 324      ASSESSMENT / PLAN / RECOMMENDATIONS:   1) Type 1 Diabetes Mellitus, Poorly Controlled, With retinopathic complications - Most recent A1c of 8.7 %. Goal A1c < 7.0 %.    - A1c remains above goal but stable despite switching from insulin  pump technology to multiple daily injections of insulin  -Patient developed nausea while on Ozempic  - She has been taking metformin  1 tablet a day after supper, I did advise the patient that she could try taking 2 tablets with supper and see how she does with that - I have reviewed her CGM download, BG's are optimal overnight but fluctuate during the day, I will adjust her prandial insulin  as below    MEDICATIONS: - Take metformin  500 mg XR, 2 tablets with supper - Continue Tresiba  40 units daily -Take NovoLog  16 units with breakfast, 16 units with lunch, 20 units with supper - Continue CF: NovoLog  (BG -120/35) TIDQAC   EDUCATION / INSTRUCTIONS: BG monitoring instructions: Patient is instructed to check her blood sugars 3 times a day, before meals  Call [facility] if: BG persistently < 70  I reviewed the Rule of 15 for the treatment of hypoglycemia in detail with the patient. Literature supplied.   2) Diabetic complications:  Eye: Does  have known diabetic retinopathy.  Neuro/ Feet: Does not have known diabetic peripheral neuropathy. Renal: Patient does not have known baseline CKD. She is not on an ACEI/ARB at present.  3) Hashimoto's thyroiditis:   - Patient is clinically euthyroid  -TSH elevated - Will increase levothyroxine   Medication Stop levothyroxine  88 mcg daily Start levothyroxine  100 mcg daily  4) Dyslipidemia:  - Will encourage lifestyle changes with exercise and low-fat diet prior  to considering lipid-lowering agents    Patient moving to [facility]  Signed electronically by: Natale Bail, MD  [facility] Endocrinology  [facility] [address] [place], [place] [address] Phone: [phone] FAX: [phone]   CC: Octavia Belton [address] [facility] [address] [facility] [address] [place] [address] Phone: [phone]  Fax: [phone]    Return to Endocrinology clinic as below: Future Appointments  Date Time Provider Department Center  03/18/2024 10:30 AM Maresa Morash, Julian Obey, MD [id number] None

## 2024-03-18 NOTE — Patient Instructions (Signed)
 Take Metformin  500 mg, 2 tablets with Supper  Continue Tresiba  40 units ONCE daily  Take Novolog  16 units with Breakfast, 16 units with lunch and 20 units with supper Novolog  correctional insulin : ADD extra units on insulin  to your meal-time Novolog  dose if your blood sugars are higher than 155. Use the scale below to help guide you:   Blood sugar before meal Number of units to inject  Less than 155 0 unit  156 -  190 1 units  191 -  225 2 units  226 -  260 3 units  261 -  295 4 units  296 -  330 5 units  331 -  365 6 units  366 -  400 7 units       - HOW TO TREAT LOW BLOOD SUGARS (Blood sugar LESS THAN 70 MG/DL) Please follow the RULE OF 15 for the treatment of hypoglycemia treatment (when your (blood sugars are less than 70 mg/dL)   STEP 1: Take 15 grams of carbohydrates when your blood sugar is low, which includes:  3-4 GLUCOSE TABS  OR 3-4 OZ OF JUICE OR REGULAR SODA OR ONE TUBE OF GLUCOSE GEL    STEP 2: RECHECK blood sugar in 15 MINUTES STEP 3: If your blood sugar is still low at the 15 minute recheck --> then, go back to STEP 1 and treat AGAIN with another 15 grams of carbohydrates.

## 2024-03-19 LAB — LIPID PANEL
Cholesterol: 247 mg/dL — ABNORMAL HIGH (ref <200)
HDL: 48 mg/dL — ABNORMAL LOW (ref >=50)
LDL Cholesterol (Calc): 156 mg/dL — ABNORMAL HIGH
Non-HDL Cholesterol (Calc): 199 mg/dL — ABNORMAL HIGH (ref <130)
Total CHOL/HDL Ratio: 5.1 (calc) — ABNORMAL HIGH (ref <5.0)
Triglycerides: 261 mg/dL — ABNORMAL HIGH (ref <150)

## 2024-03-19 LAB — BASIC METABOLIC PANEL WITH GFR
BUN: 10 mg/dL (ref 7–25)
CO2: 25 mmol/L (ref 20–32)
Calcium: 9.2 mg/dL (ref 8.6–10.2)
Chloride: 102 mmol/L (ref 98–110)
Creat: 0.65 mg/dL (ref 0.50–0.97)
Glucose, Bld: 147 mg/dL — ABNORMAL HIGH (ref 65–99)
Potassium: 4.5 mmol/L (ref 3.5–5.3)
Sodium: 136 mmol/L (ref 135–146)
eGFR: 119 mL/min/{1.73_m2} (ref >=60)

## 2024-03-19 LAB — MICROALBUMIN / CREATININE URINE RATIO
Creatinine, Urine: 155 mg/dL (ref 20–275)
Microalb Creat Ratio: 8 mg/g{creat} (ref <30)
Microalb, Ur: 1.2 mg/dL

## 2024-03-19 LAB — TSH: TSH: 6.67 m[IU]/L — ABNORMAL HIGH

## 2024-03-22 ENCOUNTER — Ambulatory Visit: Payer: Self-pay | Admitting: Internal Medicine

## 2024-03-22 MED ORDER — LEVOTHYROXINE SODIUM 100 MCG PO TABS
100.0000 ug | ORAL_TABLET | Freq: Every day | ORAL | 3 refills | Status: AC
Start: 2024-03-22 — End: ?

## 2024-03-27 ENCOUNTER — Encounter: Payer: Self-pay | Admitting: Family Medicine

## 2024-03-28 NOTE — Telephone Encounter (Signed)
 Spoke with pt and explained where to access immunizations from mychart. She is able to pull up the complete list. Also asked about TB skin testing. Advised her that some employers require it annually and to check with her HR contact for her requirement then let us  know if that needs to be completed.

## 2024-05-12 ENCOUNTER — Encounter: Payer: Self-pay | Admitting: Internal Medicine

## 2024-05-12 ENCOUNTER — Ambulatory Visit: Payer: Self-pay

## 2024-05-12 ENCOUNTER — Encounter: Payer: Self-pay | Admitting: Family Medicine

## 2024-05-12 NOTE — Telephone Encounter (Signed)
 FYI Only or Action Required?: FYI only for provider.  Patient was last seen in primary care on 06/09/2022 by Antonio Meth, Jamee SAUNDERS, DO.  Called Nurse Triage reporting Rectal Bleeding.  Symptoms began yesterday.  Interventions attempted: Nothing.  Symptoms are: gradually worsening.  Triage Disposition: Go to ED Now (Notify PCP)  Patient/caregiver understands and will follow disposition?: YesCopied from CRM [id number]. Topic: Clinical - Red Word Triage >> May 12, 2024  8:06 AM Thersia BROCKS wrote: Red Word that prompted transfer to Nurse Triage: Patient called in stated she has been taking  metFORMIN  (GLUCOPHAGE -XR) 500 MG 24 hr tablet , suppose to be taking 2 pills but she couldn't do it anymore because it was giving her problems starting going back to only taking one, patient stated she has been having diarrhea and is now having blood in her stool Reason for Disposition  [1] MODERATE rectal bleeding (e.g., small blood clots, passing blood without stool, or toilet water turns red) AND [2] more than once a day  Answer Assessment - Initial Assessment Questions 1. APPEARANCE of BLOOD: What color is it? Is it passed separately, on the surface of the stool, or mixed in with the stool?      Bright red 2. AMOUNT: How much blood was passed?      Not sure 3. FREQUENCY: How many times has blood been passed with the stools?      2-3 4. ONSET: When was the blood first seen in the stools? (Days or weeks)      Several  5. DIARRHEA: Is there also some diarrhea? If Yes, ask: How many diarrhea stools in the past 24 hours?      Countless- 4-5 times this morning 6. CONSTIPATION: Do you have constipation? If Yes, ask: How bad is it?     Na      8. BLOOD THINNERS: Do you take any blood thinners? (e.g., aspirin, clopidogrel / Plavix, coumadin, heparin). Notes: Other strong blood thinners include: Arixtra (fondaparinux), Eliquis (apixaban), Pradaxa (dabigatran), and Xarelto (rivaroxaban).      denies 9. OTHER SYMPTOMS: Do you have any other symptoms?  (e.g., abdomen pain, vomiting, dizziness, fever)     Headache yesterday; upset stomach    Pt has Type 1 DM. Pt has been taking Metformin  1 tab but was intrusted to increased to 2 tabs daily. Due to excessive diarrhea, pt cut back to 1 tab last week and diarrhea has persisted.  Pt has had 2-3 BM with bleeding while wiping off but this morning more blood with clots. Blood color has changed from bright red to dark red as of this morning. Any time pt eats, diarrhea within the hour. [place] and doesn't have established care with new PCP yet. RN advised ED and to find new PCP to follow up with. PT stated she will go to ED.  Protocols used: Rectal Bleeding-A-AH

## 2024-05-16 NOTE — Telephone Encounter (Signed)
 See answer from endo

## 2024-07-20 ENCOUNTER — Encounter: Payer: Self-pay | Admitting: Internal Medicine

## 2024-07-20 DIAGNOSIS — E109 Type 1 diabetes mellitus without complications: Secondary | ICD-10-CM

## 2024-08-05 ENCOUNTER — Telehealth: Payer: Self-pay

## 2024-08-05 NOTE — Telephone Encounter (Signed)
 Patient states that the Endocrinologist in [place]  still doesn't have the referral.

## 2024-08-11 ENCOUNTER — Telehealth: Payer: Self-pay | Admitting: Internal Medicine

## 2024-08-11 NOTE — Telephone Encounter (Signed)
 Patient called this morning,she stated that a referral to her new provider's office was sent but the new office has not received it.  [facility]. Phone number: [phone]. Fax number: [phone]  Referral and most up to date labs and notes.

## 2024-12-02 ENCOUNTER — Other Ambulatory Visit: Payer: Self-pay | Admitting: Internal Medicine

## 2024-12-02 DIAGNOSIS — Z01419 Encounter for gynecological examination (general) (routine) without abnormal findings: Secondary | ICD-10-CM
